# Patient Record
Sex: Female | Born: 1965 | Hispanic: No | Marital: Married | Smoking: Never smoker
Health system: Southern US, Community
[De-identification: ages and names within clinical notes are randomized; demographics above are authoritative.]

## PROBLEM LIST (undated history)

## (undated) DIAGNOSIS — K219 Gastro-esophageal reflux disease without esophagitis: Secondary | ICD-10-CM

## (undated) DIAGNOSIS — E039 Hypothyroidism, unspecified: Secondary | ICD-10-CM

## (undated) DIAGNOSIS — C50919 Malignant neoplasm of unspecified site of unspecified female breast: Secondary | ICD-10-CM

## (undated) DIAGNOSIS — J189 Pneumonia, unspecified organism: Secondary | ICD-10-CM

## (undated) DIAGNOSIS — E079 Disorder of thyroid, unspecified: Secondary | ICD-10-CM

## (undated) DIAGNOSIS — D649 Anemia, unspecified: Secondary | ICD-10-CM

## (undated) HISTORY — DX: Disorder of thyroid, unspecified: E07.9

## (undated) HISTORY — DX: Malignant neoplasm of unspecified site of unspecified female breast: C50.919

## (undated) HISTORY — PX: OTHER SURGICAL HISTORY: SHX169

## (undated) HISTORY — PX: KNEE ARTHROSCOPY: SHX127

## (undated) HISTORY — PX: BREAST RECONSTRUCTION: SHX9

## (undated) HISTORY — PX: COLONOSCOPY: SHX174

## (undated) MED FILL — Fosaprepitant Dimeglumine For IV Infusion 150 MG (Base Eq): INTRAVENOUS | Qty: 5 | Status: AC

## (undated) MED FILL — Dexamethasone Sodium Phosphate Inj 100 MG/10ML: INTRAMUSCULAR | Qty: 1 | Status: AC

---

## 1987-10-26 DIAGNOSIS — C8190 Hodgkin lymphoma, unspecified, unspecified site: Secondary | ICD-10-CM

## 1987-10-26 HISTORY — PX: LYMPH NODE BIOPSY: SHX201

## 1987-10-26 HISTORY — DX: Hodgkin lymphoma, unspecified, unspecified site: C81.90

## 1999-01-15 ENCOUNTER — Other Ambulatory Visit: Admission: RE | Admit: 1999-01-15 | Discharge: 1999-01-15 | Payer: Self-pay | Admitting: Obstetrics and Gynecology

## 1999-06-25 ENCOUNTER — Inpatient Hospital Stay (HOSPITAL_COMMUNITY): Admission: AD | Admit: 1999-06-25 | Discharge: 1999-06-25 | Payer: Self-pay | Admitting: Obstetrics and Gynecology

## 1999-08-18 ENCOUNTER — Inpatient Hospital Stay (HOSPITAL_COMMUNITY): Admission: AD | Admit: 1999-08-18 | Discharge: 1999-08-20 | Payer: Self-pay | Admitting: Obstetrics and Gynecology

## 1999-09-29 ENCOUNTER — Other Ambulatory Visit: Admission: RE | Admit: 1999-09-29 | Discharge: 1999-09-29 | Payer: Self-pay | Admitting: Obstetrics and Gynecology

## 2000-11-21 ENCOUNTER — Other Ambulatory Visit: Admission: RE | Admit: 2000-11-21 | Discharge: 2000-11-21 | Payer: Self-pay | Admitting: Obstetrics and Gynecology

## 2001-12-20 ENCOUNTER — Other Ambulatory Visit: Admission: RE | Admit: 2001-12-20 | Discharge: 2001-12-20 | Payer: Self-pay | Admitting: Obstetrics and Gynecology

## 2002-12-28 ENCOUNTER — Other Ambulatory Visit: Admission: RE | Admit: 2002-12-28 | Discharge: 2002-12-28 | Payer: Self-pay | Admitting: Obstetrics and Gynecology

## 2004-02-12 ENCOUNTER — Other Ambulatory Visit: Admission: RE | Admit: 2004-02-12 | Discharge: 2004-02-12 | Payer: Self-pay | Admitting: Obstetrics and Gynecology

## 2004-12-18 ENCOUNTER — Encounter: Admission: RE | Admit: 2004-12-18 | Discharge: 2004-12-18 | Payer: Self-pay | Admitting: Family Medicine

## 2005-03-04 ENCOUNTER — Other Ambulatory Visit: Admission: RE | Admit: 2005-03-04 | Discharge: 2005-03-04 | Payer: Self-pay | Admitting: Obstetrics and Gynecology

## 2005-10-25 DIAGNOSIS — C50919 Malignant neoplasm of unspecified site of unspecified female breast: Secondary | ICD-10-CM

## 2005-10-25 HISTORY — PX: MASTECTOMY: SHX3

## 2005-10-25 HISTORY — DX: Malignant neoplasm of unspecified site of unspecified female breast: C50.919

## 2006-01-11 ENCOUNTER — Encounter (INDEPENDENT_AMBULATORY_CARE_PROVIDER_SITE_OTHER): Payer: Self-pay | Admitting: Diagnostic Radiology

## 2006-01-11 ENCOUNTER — Encounter (INDEPENDENT_AMBULATORY_CARE_PROVIDER_SITE_OTHER): Payer: Self-pay | Admitting: Specialist

## 2006-01-11 ENCOUNTER — Encounter: Admission: RE | Admit: 2006-01-11 | Discharge: 2006-01-11 | Payer: Self-pay | Admitting: Obstetrics and Gynecology

## 2006-01-19 ENCOUNTER — Ambulatory Visit: Payer: Self-pay | Admitting: Oncology

## 2006-01-19 ENCOUNTER — Encounter: Admission: RE | Admit: 2006-01-19 | Discharge: 2006-01-19 | Payer: Self-pay | Admitting: Obstetrics and Gynecology

## 2006-01-24 ENCOUNTER — Ambulatory Visit: Admission: RE | Admit: 2006-01-24 | Discharge: 2006-03-07 | Payer: Self-pay | Admitting: Radiation Oncology

## 2006-02-22 ENCOUNTER — Inpatient Hospital Stay (HOSPITAL_COMMUNITY): Admission: RE | Admit: 2006-02-22 | Discharge: 2006-02-24 | Payer: Self-pay | Admitting: Surgery

## 2006-02-22 ENCOUNTER — Encounter (INDEPENDENT_AMBULATORY_CARE_PROVIDER_SITE_OTHER): Payer: Self-pay | Admitting: *Deleted

## 2006-03-18 ENCOUNTER — Ambulatory Visit: Payer: Self-pay | Admitting: Oncology

## 2006-03-23 LAB — CBC WITH DIFFERENTIAL/PLATELET
BASO%: 0.9 % (ref 0.0–2.0)
Basophils Absolute: 0.1 10e3/uL (ref 0.0–0.1)
EOS%: 4.5 % (ref 0.0–7.0)
Eosinophils Absolute: 0.4 10e3/uL (ref 0.0–0.5)
HCT: 35.7 % (ref 34.8–46.6)
HGB: 11.8 g/dL (ref 11.6–15.9)
LYMPH%: 35 % (ref 14.0–48.0)
MCH: 31.2 pg (ref 26.0–34.0)
MCHC: 33.2 g/dL (ref 32.0–36.0)
MCV: 93.9 fL (ref 81.0–101.0)
MONO#: 0.6 10e3/uL (ref 0.1–0.9)
MONO%: 7.1 % (ref 0.0–13.0)
NEUT#: 4.6 10e3/uL (ref 1.5–6.5)
NEUT%: 52.5 % (ref 39.6–76.8)
Platelets: 391 10e3/uL (ref 145–400)
RBC: 3.8 10e6/uL (ref 3.70–5.32)
RDW: 14.1 % (ref 11.3–14.5)
WBC: 8.7 10e3/uL (ref 3.9–10.0)
lymph#: 3 10e3/uL (ref 0.9–3.3)

## 2006-03-23 LAB — COMPREHENSIVE METABOLIC PANEL
ALT: 14 U/L (ref 0–40)
AST: 14 U/L (ref 0–37)
Albumin: 3.9 g/dL (ref 3.5–5.2)
Alkaline Phosphatase: 56 U/L (ref 39–117)
BUN: 14 mg/dL (ref 6–23)
Chloride: 105 mEq/L (ref 96–112)
Potassium: 4.5 mEq/L (ref 3.5–5.3)
Sodium: 138 mEq/L (ref 135–145)
Total Protein: 7.6 g/dL (ref 6.0–8.3)

## 2006-03-23 LAB — CANCER ANTIGEN 27.29: CA 27.29: 10 U/mL (ref 0–39)

## 2006-03-28 ENCOUNTER — Ambulatory Visit (HOSPITAL_COMMUNITY): Admission: RE | Admit: 2006-03-28 | Discharge: 2006-03-28 | Payer: Self-pay | Admitting: Oncology

## 2006-03-29 ENCOUNTER — Encounter (INDEPENDENT_AMBULATORY_CARE_PROVIDER_SITE_OTHER): Payer: Self-pay | Admitting: Cardiology

## 2006-03-29 ENCOUNTER — Ambulatory Visit: Admission: RE | Admit: 2006-03-29 | Discharge: 2006-03-29 | Payer: Self-pay | Admitting: Oncology

## 2006-04-18 ENCOUNTER — Ambulatory Visit (HOSPITAL_BASED_OUTPATIENT_CLINIC_OR_DEPARTMENT_OTHER): Admission: RE | Admit: 2006-04-18 | Discharge: 2006-04-18 | Payer: Self-pay | Admitting: Surgery

## 2006-04-20 LAB — CBC WITH DIFFERENTIAL/PLATELET
BASO%: 1 % (ref 0.0–2.0)
Basophils Absolute: 0.2 10*3/uL — ABNORMAL HIGH (ref 0.0–0.1)
EOS%: 2.4 % (ref 0.0–7.0)
Eosinophils Absolute: 0.4 10*3/uL (ref 0.0–0.5)
HCT: 35.1 % (ref 34.8–46.6)
HGB: 11.5 g/dL — ABNORMAL LOW (ref 11.6–15.9)
LYMPH%: 45.1 % (ref 14.0–48.0)
MCH: 31.2 pg (ref 26.0–34.0)
MCHC: 32.8 g/dL (ref 32.0–36.0)
MCV: 94.9 fL (ref 81.0–101.0)
MONO#: 1.4 10*3/uL — ABNORMAL HIGH (ref 0.1–0.9)
MONO%: 8.6 % (ref 0.0–13.0)
NEUT#: 6.8 10*3/uL — ABNORMAL HIGH (ref 1.5–6.5)
NEUT%: 42.9 % (ref 39.6–76.8)
Platelets: 420 10*3/uL — ABNORMAL HIGH (ref 145–400)
RBC: 3.7 10*6/uL (ref 3.70–5.32)
RDW: 12.8 % (ref 11.3–14.5)
WBC: 15.8 10*3/uL — ABNORMAL HIGH (ref 3.9–10.0)
lymph#: 7.1 10*3/uL — ABNORMAL HIGH (ref 0.9–3.3)

## 2006-04-20 LAB — TECHNOLOGIST REVIEW

## 2006-04-26 LAB — CBC WITH DIFFERENTIAL/PLATELET
BASO%: 0.6 % (ref 0.0–2.0)
Eosinophils Absolute: 0.3 10*3/uL (ref 0.0–0.5)
HCT: 35 % (ref 34.8–46.6)
MCHC: 33.7 g/dL (ref 32.0–36.0)
MONO#: 0.1 10*3/uL (ref 0.1–0.9)
NEUT#: 2.4 10*3/uL (ref 1.5–6.5)
NEUT%: 60.4 % (ref 39.6–76.8)
WBC: 3.9 10*3/uL (ref 3.9–10.0)
lymph#: 1.2 10*3/uL (ref 0.9–3.3)

## 2006-05-03 ENCOUNTER — Ambulatory Visit: Payer: Self-pay | Admitting: Oncology

## 2006-05-03 LAB — CBC WITH DIFFERENTIAL/PLATELET
Basophils Absolute: 0.1 10*3/uL (ref 0.0–0.1)
HCT: 35.2 % (ref 34.8–46.6)
HGB: 11.7 g/dL (ref 11.6–15.9)
MCH: 31.2 pg (ref 26.0–34.0)
MONO#: 2 10*3/uL — ABNORMAL HIGH (ref 0.1–0.9)
NEUT%: 70.4 % (ref 39.6–76.8)
Platelets: 288 10*3/uL (ref 145–400)
WBC: 20.3 10*3/uL — ABNORMAL HIGH (ref 3.9–10.0)
lymph#: 3.8 10*3/uL — ABNORMAL HIGH (ref 0.9–3.3)

## 2006-05-10 ENCOUNTER — Ambulatory Visit (HOSPITAL_COMMUNITY): Admission: RE | Admit: 2006-05-10 | Discharge: 2006-05-10 | Payer: Self-pay | Admitting: Oncology

## 2006-05-10 LAB — CBC WITH DIFFERENTIAL/PLATELET
Basophils Absolute: 0 10*3/uL (ref 0.0–0.1)
EOS%: 0.2 % (ref 0.0–7.0)
HCT: 32.6 % — ABNORMAL LOW (ref 34.8–46.6)
HGB: 11.1 g/dL — ABNORMAL LOW (ref 11.6–15.9)
LYMPH%: 31.5 % (ref 14.0–48.0)
MCH: 31.3 pg (ref 26.0–34.0)
MCV: 92.1 fL (ref 81.0–101.0)
MONO%: 2.3 % (ref 0.0–13.0)
NEUT%: 65.5 % (ref 39.6–76.8)

## 2006-05-17 LAB — CBC WITH DIFFERENTIAL/PLATELET
BASO%: 0.3 % (ref 0.0–2.0)
Eosinophils Absolute: 0 10*3/uL (ref 0.0–0.5)
MCHC: 33.5 g/dL (ref 32.0–36.0)
MONO#: 1.8 10*3/uL — ABNORMAL HIGH (ref 0.1–0.9)
NEUT#: 13.2 10*3/uL — ABNORMAL HIGH (ref 1.5–6.5)
RBC: 3.71 10*6/uL (ref 3.70–5.32)
RDW: 14.5 % (ref 11.3–14.5)
WBC: 18 10*3/uL — ABNORMAL HIGH (ref 3.9–10.0)

## 2006-05-24 LAB — CBC WITH DIFFERENTIAL/PLATELET
BASO%: 0.5 % (ref 0.0–2.0)
Eosinophils Absolute: 0 10*3/uL (ref 0.0–0.5)
HCT: 31.6 % — ABNORMAL LOW (ref 34.8–46.6)
LYMPH%: 31 % (ref 14.0–48.0)
MCHC: 34.1 g/dL (ref 32.0–36.0)
MONO#: 0.1 10*3/uL (ref 0.1–0.9)
NEUT#: 1.8 10*3/uL (ref 1.5–6.5)
NEUT%: 65.9 % (ref 39.6–76.8)
Platelets: 296 10*3/uL (ref 145–400)
RBC: 3.42 10*6/uL — ABNORMAL LOW (ref 3.70–5.32)
WBC: 2.8 10*3/uL — ABNORMAL LOW (ref 3.9–10.0)
lymph#: 0.9 10*3/uL (ref 0.9–3.3)

## 2006-05-31 LAB — CBC WITH DIFFERENTIAL/PLATELET
BASO%: 0.4 % (ref 0.0–2.0)
EOS%: 0.1 % (ref 0.0–7.0)
HCT: 34.1 % — ABNORMAL LOW (ref 34.8–46.6)
LYMPH%: 13.3 % — ABNORMAL LOW (ref 14.0–48.0)
MCH: 31.5 pg (ref 26.0–34.0)
MCHC: 33.7 g/dL (ref 32.0–36.0)
NEUT%: 70.5 % (ref 39.6–76.8)
RBC: 3.64 10*6/uL — ABNORMAL LOW (ref 3.70–5.32)
lymph#: 1.9 10*3/uL (ref 0.9–3.3)

## 2006-06-10 LAB — CBC WITH DIFFERENTIAL/PLATELET
BASO%: 3.9 % — ABNORMAL HIGH (ref 0.0–2.0)
EOS%: 0.3 % (ref 0.0–7.0)
HGB: 10.1 g/dL — ABNORMAL LOW (ref 11.6–15.9)
MCH: 30.9 pg (ref 26.0–34.0)
MCHC: 33.5 g/dL (ref 32.0–36.0)
RDW: 14.3 % (ref 11.3–14.5)
lymph#: 1.7 10*3/uL (ref 0.9–3.3)

## 2006-06-12 ENCOUNTER — Ambulatory Visit: Payer: Self-pay | Admitting: Oncology

## 2006-06-21 LAB — CBC WITH DIFFERENTIAL/PLATELET
Basophils Absolute: 0 10*3/uL (ref 0.0–0.1)
Eosinophils Absolute: 0 10*3/uL (ref 0.0–0.5)
HGB: 10.8 g/dL — ABNORMAL LOW (ref 11.6–15.9)
MONO#: 1.5 10*3/uL — ABNORMAL HIGH (ref 0.1–0.9)
NEUT#: 7 10*3/uL — ABNORMAL HIGH (ref 1.5–6.5)
RDW: 17.1 % — ABNORMAL HIGH (ref 11.3–14.5)
lymph#: 1.5 10*3/uL (ref 0.9–3.3)

## 2006-06-28 LAB — CBC WITH DIFFERENTIAL/PLATELET
Basophils Absolute: 0 10*3/uL (ref 0.0–0.1)
Eosinophils Absolute: 0.1 10*3/uL (ref 0.0–0.5)
HGB: 11.1 g/dL — ABNORMAL LOW (ref 11.6–15.9)
LYMPH%: 25.3 % (ref 14.0–48.0)
MCV: 93.4 fL (ref 81.0–101.0)
MONO%: 11.5 % (ref 0.0–13.0)
NEUT#: 4.1 10*3/uL (ref 1.5–6.5)
Platelets: 470 10*3/uL — ABNORMAL HIGH (ref 145–400)
RDW: 17 % — ABNORMAL HIGH (ref 11.3–14.5)

## 2006-06-28 LAB — URINALYSIS, MICROSCOPIC - CHCC
Glucose: NEGATIVE g/dL
Nitrite: POSITIVE
Protein: <30 mg/dL

## 2006-07-05 LAB — CBC WITH DIFFERENTIAL/PLATELET
BASO%: 0.1 % (ref 0.0–2.0)
EOS%: 4.6 % (ref 0.0–7.0)
HCT: 32.8 % — ABNORMAL LOW (ref 34.8–46.6)
LYMPH%: 30.6 % (ref 14.0–48.0)
MCH: 31.3 pg (ref 26.0–34.0)
MCHC: 33.3 g/dL (ref 32.0–36.0)
MCV: 93.9 fL (ref 81.0–101.0)
MONO%: 9.2 % (ref 0.0–13.0)
NEUT%: 55.5 % (ref 39.6–76.8)
Platelets: 357 10*3/uL (ref 145–400)

## 2006-07-06 LAB — URINALYSIS, MICROSCOPIC - CHCC
Leukocyte Esterase: NEGATIVE
Nitrite: NEGATIVE
Protein: NEGATIVE mg/dL
Specific Gravity, Urine: 1.03 (ref 1.003–1.035)

## 2006-07-12 LAB — CBC WITH DIFFERENTIAL/PLATELET
BASO%: 1.3 % (ref 0.0–2.0)
EOS%: 4.6 % (ref 0.0–7.0)
Eosinophils Absolute: 0.2 10*3/uL (ref 0.0–0.5)
HCT: 31.5 % — ABNORMAL LOW (ref 34.8–46.6)
LYMPH%: 24.8 % (ref 14.0–48.0)
MCH: 31.9 pg (ref 26.0–34.0)
MCHC: 33.8 g/dL (ref 32.0–36.0)
MCV: 94.1 fL (ref 81.0–101.0)
MONO#: 0.5 10*3/uL (ref 0.1–0.9)
MONO%: 9.8 % (ref 0.0–13.0)
NEUT%: 59.5 % (ref 39.6–76.8)
Platelets: 413 10*3/uL — ABNORMAL HIGH (ref 145–400)
RDW: 17.4 % — ABNORMAL HIGH (ref 11.3–14.5)
WBC: 5.2 10*3/uL (ref 3.9–10.0)

## 2006-07-15 ENCOUNTER — Ambulatory Visit: Payer: Self-pay | Admitting: Oncology

## 2006-07-19 LAB — CBC WITH DIFFERENTIAL/PLATELET
Basophils Absolute: 0 10*3/uL (ref 0.0–0.1)
EOS%: 4 % (ref 0.0–7.0)
Eosinophils Absolute: 0.2 10*3/uL (ref 0.0–0.5)
HCT: 32.8 % — ABNORMAL LOW (ref 34.8–46.6)
HGB: 10.9 g/dL — ABNORMAL LOW (ref 11.6–15.9)
LYMPH%: 29.6 % (ref 14.0–48.0)
MCH: 31.5 pg (ref 26.0–34.0)
MCV: 94.9 fL (ref 81.0–101.0)
MONO%: 11 % (ref 0.0–13.0)
NEUT#: 3.4 10*3/uL (ref 1.5–6.5)
NEUT%: 55.4 % (ref 39.6–76.8)
Platelets: 438 10*3/uL — ABNORMAL HIGH (ref 145–400)

## 2006-07-19 LAB — URINALYSIS, MICROSCOPIC - CHCC
Bilirubin (Urine): NEGATIVE
Blood: NEGATIVE
Nitrite: NEGATIVE
Protein: NEGATIVE mg/dL
pH: 6 (ref 4.6–8.0)

## 2006-07-21 LAB — URINE CULTURE

## 2006-07-26 LAB — CBC WITH DIFFERENTIAL/PLATELET
Basophils Absolute: 0 10*3/uL (ref 0.0–0.1)
EOS%: 2.1 % (ref 0.0–7.0)
Eosinophils Absolute: 0.1 10*3/uL (ref 0.0–0.5)
HGB: 11.5 g/dL — ABNORMAL LOW (ref 11.6–15.9)
LYMPH%: 25.8 % (ref 14.0–48.0)
MCH: 31.9 pg (ref 26.0–34.0)
MCV: 95.3 fL (ref 81.0–101.0)
MONO%: 8.9 % (ref 0.0–13.0)
Platelets: 466 10*3/uL — ABNORMAL HIGH (ref 145–400)
RBC: 3.6 10*6/uL — ABNORMAL LOW (ref 3.70–5.32)
RDW: 17.6 % — ABNORMAL HIGH (ref 11.3–14.5)

## 2006-08-09 LAB — CBC WITH DIFFERENTIAL/PLATELET
BASO%: 1.7 % (ref 0.0–2.0)
EOS%: 1.2 % (ref 0.0–7.0)
Eosinophils Absolute: 0.1 10*3/uL (ref 0.0–0.5)
LYMPH%: 34.2 % (ref 14.0–48.0)
MCHC: 33.7 g/dL (ref 32.0–36.0)
MCV: 92.8 fL (ref 81.0–101.0)
MONO%: 8.1 % (ref 0.0–13.0)
NEUT#: 2.6 10*3/uL (ref 1.5–6.5)
Platelets: 482 10*3/uL — ABNORMAL HIGH (ref 145–400)
RBC: 3.7 10*6/uL (ref 3.70–5.32)
RDW: 16.4 % — ABNORMAL HIGH (ref 11.3–14.5)

## 2006-08-16 LAB — CBC WITH DIFFERENTIAL/PLATELET
BASO%: 0 % (ref 0.0–2.0)
Basophils Absolute: 0 10*3/uL (ref 0.0–0.1)
Eosinophils Absolute: 0.1 10*3/uL (ref 0.0–0.5)
HCT: 35.9 % (ref 34.8–46.6)
LYMPH%: 31.7 % (ref 14.0–48.0)
MCHC: 32.8 g/dL (ref 32.0–36.0)
MCV: 95 fL (ref 81.0–101.0)
MONO#: 0.5 10*3/uL (ref 0.1–0.9)
MONO%: 10.1 % (ref 0.0–13.0)
NEUT#: 2.7 10*3/uL (ref 1.5–6.5)
RBC: 3.78 10*6/uL (ref 3.70–5.32)
RDW: 17.8 % — ABNORMAL HIGH (ref 11.3–14.5)
WBC: 4.8 10*3/uL (ref 3.9–10.0)
lymph#: 1.5 10*3/uL (ref 0.9–3.3)

## 2006-08-16 LAB — TECHNOLOGIST REVIEW

## 2006-08-19 ENCOUNTER — Ambulatory Visit: Payer: Self-pay | Admitting: Oncology

## 2006-08-23 LAB — CBC WITH DIFFERENTIAL/PLATELET
BASO%: 0 % (ref 0.0–2.0)
EOS%: 1.5 % (ref 0.0–7.0)
Eosinophils Absolute: 0.1 10*3/uL (ref 0.0–0.5)
LYMPH%: 38.2 % (ref 14.0–48.0)
MCH: 31.4 pg (ref 26.0–34.0)
MCHC: 32.8 g/dL (ref 32.0–36.0)
MCV: 95.7 fL (ref 81.0–101.0)
MONO%: 8.7 % (ref 0.0–13.0)
Platelets: 417 10*3/uL — ABNORMAL HIGH (ref 145–400)
RBC: 3.77 10*6/uL (ref 3.70–5.32)

## 2006-08-23 LAB — TECHNOLOGIST REVIEW

## 2006-10-03 ENCOUNTER — Ambulatory Visit (HOSPITAL_COMMUNITY): Admission: RE | Admit: 2006-10-03 | Discharge: 2006-10-03 | Payer: Self-pay | Admitting: Oncology

## 2006-10-06 ENCOUNTER — Ambulatory Visit: Payer: Self-pay | Admitting: Oncology

## 2006-10-10 LAB — COMPREHENSIVE METABOLIC PANEL
AST: 19 U/L (ref 0–37)
Albumin: 4.2 g/dL (ref 3.5–5.2)
Alkaline Phosphatase: 72 U/L (ref 39–117)
Potassium: 4 mEq/L (ref 3.5–5.3)
Sodium: 138 mEq/L (ref 135–145)
Total Bilirubin: 0.3 mg/dL (ref 0.3–1.2)
Total Protein: 7.7 g/dL (ref 6.0–8.3)

## 2006-10-10 LAB — CBC WITH DIFFERENTIAL/PLATELET
Basophils Absolute: 0.1 10*3/uL (ref 0.0–0.1)
Eosinophils Absolute: 0.2 10*3/uL (ref 0.0–0.5)
HCT: 36.7 % (ref 34.8–46.6)
HGB: 12.4 g/dL (ref 11.6–15.9)
LYMPH%: 40.5 % (ref 14.0–48.0)
MONO#: 0.7 10*3/uL (ref 0.1–0.9)
NEUT#: 3.4 10*3/uL (ref 1.5–6.5)
NEUT%: 45.3 % (ref 39.6–76.8)
Platelets: 368 10*3/uL (ref 145–400)
RBC: 4.13 10*6/uL (ref 3.70–5.32)
WBC: 7.4 10*3/uL (ref 3.9–10.0)

## 2006-10-10 LAB — CANCER ANTIGEN 27.29: CA 27.29: 11 U/mL (ref 0–39)

## 2006-12-21 ENCOUNTER — Ambulatory Visit: Payer: Self-pay | Admitting: Oncology

## 2006-12-26 LAB — COMPREHENSIVE METABOLIC PANEL
Alkaline Phosphatase: 61 U/L (ref 39–117)
BUN: 16 mg/dL (ref 6–23)
Glucose, Bld: 83 mg/dL (ref 70–99)
Sodium: 139 mEq/L (ref 135–145)
Total Bilirubin: 0.2 mg/dL — ABNORMAL LOW (ref 0.3–1.2)
Total Protein: 7.5 g/dL (ref 6.0–8.3)

## 2006-12-26 LAB — CBC WITH DIFFERENTIAL/PLATELET
EOS%: 1.3 % (ref 0.0–7.0)
Eosinophils Absolute: 0.1 10*3/uL (ref 0.0–0.5)
LYMPH%: 40.2 % (ref 14.0–48.0)
MCH: 30.4 pg (ref 26.0–34.0)
MCV: 89.8 fL (ref 81.0–101.0)
MONO%: 8.1 % (ref 0.0–13.0)
NEUT#: 4 10*3/uL (ref 1.5–6.5)
Platelets: 338 10*3/uL (ref 145–400)
RBC: 3.84 10*6/uL (ref 3.70–5.32)

## 2007-01-11 ENCOUNTER — Ambulatory Visit (HOSPITAL_COMMUNITY): Admission: RE | Admit: 2007-01-11 | Discharge: 2007-01-11 | Payer: Self-pay | Admitting: Oncology

## 2007-04-11 ENCOUNTER — Ambulatory Visit: Payer: Self-pay | Admitting: Oncology

## 2007-04-17 LAB — COMPREHENSIVE METABOLIC PANEL
ALT: 20 U/L (ref 0–35)
AST: 21 U/L (ref 0–37)
Albumin: 3.9 g/dL (ref 3.5–5.2)
Alkaline Phosphatase: 68 U/L (ref 39–117)
Glucose, Bld: 99 mg/dL (ref 70–99)
Potassium: 4.1 mEq/L (ref 3.5–5.3)
Sodium: 139 mEq/L (ref 135–145)
Total Protein: 7.5 g/dL (ref 6.0–8.3)

## 2007-04-17 LAB — CBC WITH DIFFERENTIAL/PLATELET
BASO%: 0.7 % (ref 0.0–2.0)
Basophils Absolute: 0 10*3/uL (ref 0.0–0.1)
EOS%: 1.9 % (ref 0.0–7.0)
Eosinophils Absolute: 0.1 10*3/uL (ref 0.0–0.5)
MCH: 31.7 pg (ref 26.0–34.0)
MCV: 93.4 fL (ref 81.0–101.0)
MONO#: 0.6 10*3/uL (ref 0.1–0.9)
MONO%: 7.5 % (ref 0.0–13.0)
NEUT#: 3.9 10*3/uL (ref 1.5–6.5)
RBC: 3.8 10*6/uL (ref 3.70–5.32)
RDW: 14.8 % — ABNORMAL HIGH (ref 11.3–14.5)
lymph#: 2.8 10*3/uL (ref 0.9–3.3)

## 2007-07-18 ENCOUNTER — Ambulatory Visit: Payer: Self-pay | Admitting: Oncology

## 2007-10-30 ENCOUNTER — Ambulatory Visit: Payer: Self-pay | Admitting: Oncology

## 2007-11-01 LAB — CBC WITH DIFFERENTIAL/PLATELET
BASO%: 0.8 % (ref 0.0–2.0)
Basophils Absolute: 0.1 10*3/uL (ref 0.0–0.1)
EOS%: 1.3 % (ref 0.0–7.0)
HCT: 35.9 % (ref 34.8–46.6)
LYMPH%: 44.5 % (ref 14.0–48.0)
MCH: 31 pg (ref 26.0–34.0)
MCHC: 33.7 g/dL (ref 32.0–36.0)
NEUT%: 46 % (ref 39.6–76.8)
Platelets: 332 10*3/uL (ref 145–400)

## 2007-11-02 LAB — COMPREHENSIVE METABOLIC PANEL
BUN: 13 mg/dL (ref 6–23)
CO2: 24 mEq/L (ref 19–32)
Calcium: 9.1 mg/dL (ref 8.4–10.5)
Chloride: 101 mEq/L (ref 96–112)
Creatinine, Ser: 0.85 mg/dL (ref 0.40–1.20)
Glucose, Bld: 116 mg/dL — ABNORMAL HIGH (ref 70–99)
Total Bilirubin: 0.3 mg/dL (ref 0.3–1.2)

## 2007-11-02 LAB — CANCER ANTIGEN 27.29: CA 27.29: 9 U/mL (ref 0–39)

## 2007-11-02 LAB — TSH: TSH: 0.511 u[IU]/mL (ref 0.350–5.500)

## 2007-11-14 IMAGING — PT NM PET TUM IMG SKULL BASE T - THIGH
6 series · 25 of 25 positions shown · non-contrast
Comparison: CT chest dated 10/03/06 and bone scan dated 03/28/06.

CLINICAL DATA: Breast Cancer
FDG PET-CT TUMOR IMAGING (SKULL BASE TO THIGHS):
Fasting Blood Glucose:  148.
TECHNIQUE: 18.6 mCi F-18 FDG was injected intravenously via the right antecubital fossa.  Full-ring PET imaging was performed from the skull base through the mid-thighs 70 minutes after injection.  CT data was obtained and used for attenuation correction and anatomic localization only.  (This was not acquired as a diagnostic CT examination).

[Series 1: pet ac · axial · 3.3mm · 4.69mm/px · z∈[-879,-9]mm · 6 of 267 slices shown]
[im 1/267]
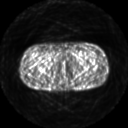
[im 54/267]
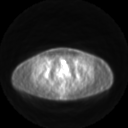
[im 107/267]
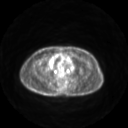
[im 160/267]
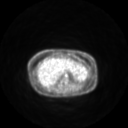
[im 213/267]
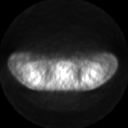
[im 267/267]
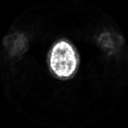

[Series 2: ct images · axial · 3.8mm · 0.98mm/px · z∈[-879,-10]mm · 6 of 267 slices shown]
[im 1/267]
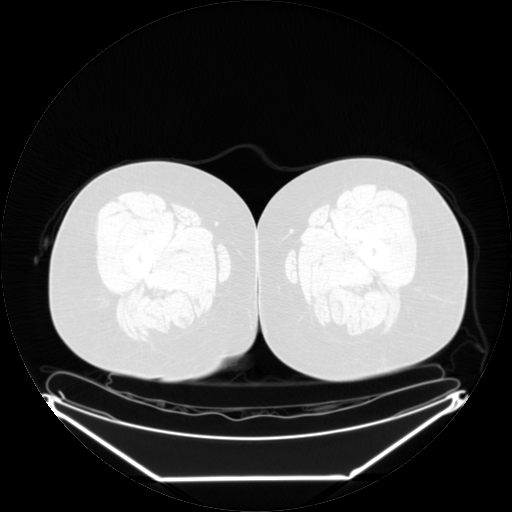
[im 54/267]
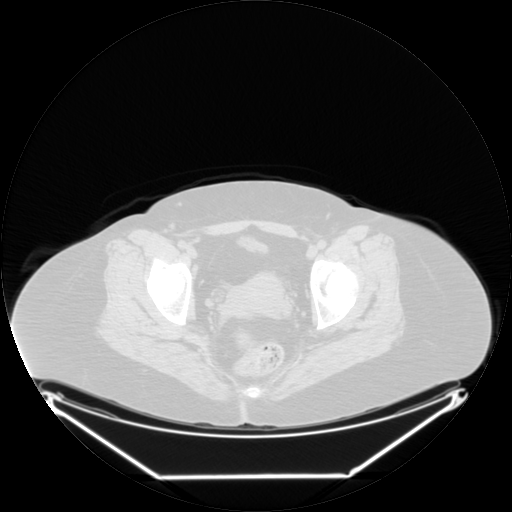
[im 107/267]
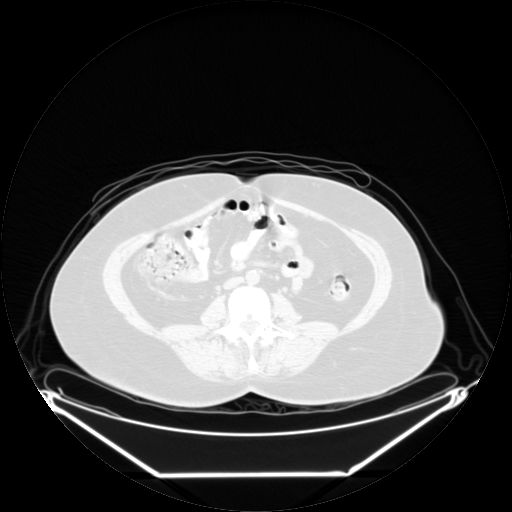
[im 160/267]
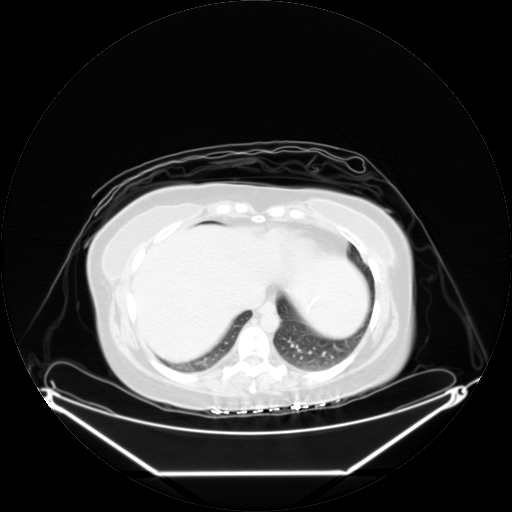
[im 213/267]
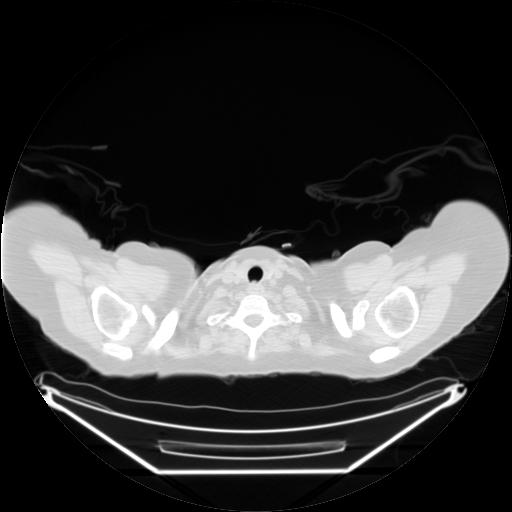
[im 267/267  brain]
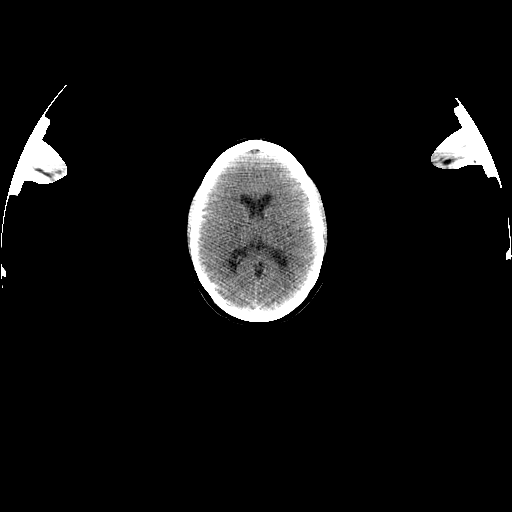

[Series 2: pet nac · axial · 3.3mm · 4.69mm/px · z∈[-879,-9]mm · 6 of 267 slices shown]
[im 1/267]
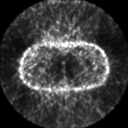
[im 54/267]
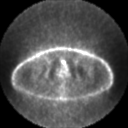
[im 107/267]
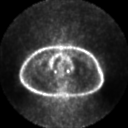
[im 160/267]
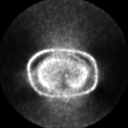
[im 213/267]
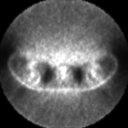
[im 267/267]
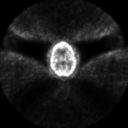

[Series 123: mip · coronal · 3.3mm · 4.69mm/px · 1 of 30 slices shown]
[im 1/30]
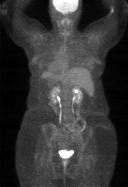

[Series 152: reformatted · axial · 3.3mm · 3.91mm/px · z∈[-794,-81]mm · 5 of 217 slices shown (1 of 2)]
[im 1/217]
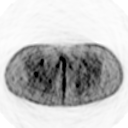
[im 55/217]
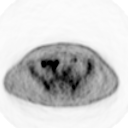
[im 109/217]
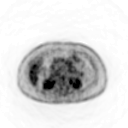
[im 163/217]
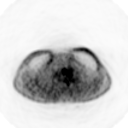
[im 217/217]
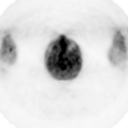

[Series 154: reformatted · coronal · 4.7mm · 6.98mm/px · 1 of 57 slices shown (2 of 2)]
[im 1/57]
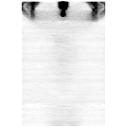

[25 of 25 positions shown; findings below may reference images not displayed]

FINDINGS: Postoperative changes from bilateral breast implants placement are noted.  There is no evidence for residual or recurrent local tumor.  Increased uptake anterior to the breast implants is noted which is somewhat nonspecific and may reflect post therapy or post-surgical change.  There is no hypermetabolic axillary, supraclavicular, or retropectoral lymphadenopathy.
Anterior mediastinal lymph node measures 15.7 x 9.0 mm (image 76).  There is no abnormal uptake within this lymph node.  No hypermetabolic mediastinal or hilar nodes are noted. There is no pericardial or pleural effusion.  
Previously described pulmonary nodule within the left upper lobe is not seen on the current exam. However, this may in fact, represent the nondiagnostic CT technique.  There are no hypermetabolic pulmonary nodules identified.  No abnormal foci of increased uptake are seen within the liver parenchyma.
The adrenal glands are negative. 
The patient is status post splenectomy.
Both kidneys are negative.
No hypermetabolic retroperitoneal or small bowel mesenteric lymph nodes are identified.
There is no hypermetabolic pelvic, or inguinal lymph nodes.
IMPRESSION: 1.  There is no definitive evidence for residual or recurrent hypermetabolic tumor.
2.  The previously described anterior mediastinal node does not exhibit abnormal increased FDG uptake on the current exam.
3.  Nonspecific left upper lobe subpleural nodule is not seen on the nondiagnostic CT portion of this exam.

## 2008-04-01 ENCOUNTER — Ambulatory Visit: Payer: Self-pay | Admitting: Oncology

## 2008-04-01 ENCOUNTER — Ambulatory Visit (HOSPITAL_COMMUNITY): Admission: RE | Admit: 2008-04-01 | Discharge: 2008-04-01 | Payer: Self-pay | Admitting: Oncology

## 2008-04-01 LAB — CBC WITH DIFFERENTIAL/PLATELET
Basophils Absolute: 0 10*3/uL (ref 0.0–0.1)
EOS%: 2.2 % (ref 0.0–7.0)
HCT: 35.8 % (ref 34.8–46.6)
HGB: 12.1 g/dL (ref 11.6–15.9)
MCH: 31.2 pg (ref 26.0–34.0)
MCHC: 33.7 g/dL (ref 32.0–36.0)
MCV: 92.8 fL (ref 81.0–101.0)
MONO%: 9.2 % (ref 0.0–13.0)
NEUT%: 57.4 % (ref 39.6–76.8)

## 2008-04-02 LAB — COMPREHENSIVE METABOLIC PANEL
AST: 18 U/L (ref 0–37)
Alkaline Phosphatase: 71 U/L (ref 39–117)
BUN: 19 mg/dL (ref 6–23)
Creatinine, Ser: 0.96 mg/dL (ref 0.40–1.20)
Total Bilirubin: 0.3 mg/dL (ref 0.3–1.2)

## 2009-02-25 ENCOUNTER — Ambulatory Visit: Payer: Self-pay | Admitting: Oncology

## 2009-02-27 LAB — COMPREHENSIVE METABOLIC PANEL
ALT: 22 U/L (ref 0–35)
AST: 23 U/L (ref 0–37)
Alkaline Phosphatase: 61 U/L (ref 39–117)
CO2: 28 mEq/L (ref 19–32)
Creatinine, Ser: 0.85 mg/dL (ref 0.40–1.20)
Sodium: 136 mEq/L (ref 135–145)
Total Bilirubin: 0.6 mg/dL (ref 0.3–1.2)

## 2009-02-27 LAB — CBC WITH DIFFERENTIAL/PLATELET
BASO%: 0.6 % (ref 0.0–2.0)
EOS%: 0.9 % (ref 0.0–7.0)
HCT: 35.4 % (ref 34.8–46.6)
LYMPH%: 36.5 % (ref 14.0–49.7)
MCH: 31.3 pg (ref 25.1–34.0)
MCHC: 33.9 g/dL (ref 31.5–36.0)
MONO%: 6.5 % (ref 0.0–14.0)
NEUT%: 55.5 % (ref 38.4–76.8)
Platelets: 330 10*3/uL (ref 145–400)
RBC: 3.84 10*6/uL (ref 3.70–5.45)

## 2009-02-27 LAB — CANCER ANTIGEN 27.29: CA 27.29: 9 U/mL (ref 0–39)

## 2009-03-08 LAB — ESTRADIOL, ULTRA SENS

## 2009-03-10 ENCOUNTER — Ambulatory Visit (HOSPITAL_COMMUNITY): Admission: RE | Admit: 2009-03-10 | Discharge: 2009-03-10 | Payer: Self-pay | Admitting: Oncology

## 2009-05-30 ENCOUNTER — Ambulatory Visit: Payer: Self-pay | Admitting: Oncology

## 2009-06-03 LAB — CBC WITH DIFFERENTIAL/PLATELET
Basophils Absolute: 0.1 10*3/uL (ref 0.0–0.1)
HCT: 35.2 % (ref 34.8–46.6)
HGB: 11.8 g/dL (ref 11.6–15.9)
MCH: 31.1 pg (ref 25.1–34.0)
MONO#: 1 10*3/uL — ABNORMAL HIGH (ref 0.1–0.9)
NEUT%: 55.1 % (ref 38.4–76.8)
lymph#: 3.6 10*3/uL — ABNORMAL HIGH (ref 0.9–3.3)

## 2009-06-03 LAB — COMPREHENSIVE METABOLIC PANEL
BUN: 8 mg/dL (ref 6–23)
CO2: 28 mEq/L (ref 19–32)
Calcium: 9.2 mg/dL (ref 8.4–10.5)
Chloride: 106 mEq/L (ref 96–112)
Creatinine, Ser: 0.78 mg/dL (ref 0.40–1.20)

## 2009-06-03 LAB — FOLLICLE STIMULATING HORMONE: FSH: 36.8 m[IU]/mL

## 2009-06-03 LAB — CANCER ANTIGEN 27.29: CA 27.29: 15 U/mL (ref 0–39)

## 2009-06-03 LAB — LUTEINIZING HORMONE: LH: 30.3 m[IU]/mL

## 2009-06-11 LAB — ESTRADIOL, ULTRA SENS

## 2009-12-01 ENCOUNTER — Ambulatory Visit: Payer: Self-pay | Admitting: Oncology

## 2010-01-12 ENCOUNTER — Ambulatory Visit: Payer: Self-pay | Admitting: Oncology

## 2010-01-14 LAB — CANCER ANTIGEN 27.29: CA 27.29: 14 U/mL (ref 0–39)

## 2010-01-14 LAB — COMPREHENSIVE METABOLIC PANEL
CO2: 23 mEq/L (ref 19–32)
Calcium: 9.4 mg/dL (ref 8.4–10.5)
Creatinine, Ser: 0.96 mg/dL (ref 0.40–1.20)
Glucose, Bld: 106 mg/dL — ABNORMAL HIGH (ref 70–99)
Sodium: 139 mEq/L (ref 135–145)
Total Bilirubin: 0.3 mg/dL (ref 0.3–1.2)
Total Protein: 8.2 g/dL (ref 6.0–8.3)

## 2010-01-14 LAB — CBC WITH DIFFERENTIAL/PLATELET
Basophils Absolute: 0.1 10*3/uL (ref 0.0–0.1)
Eosinophils Absolute: 0.3 10*3/uL (ref 0.0–0.5)
HGB: 12.8 g/dL (ref 11.6–15.9)
LYMPH%: 44.8 % (ref 14.0–49.7)
MCV: 94.1 fL (ref 79.5–101.0)
MONO%: 6.4 % (ref 0.0–14.0)
NEUT#: 5 10*3/uL (ref 1.5–6.5)
Platelets: 371 10*3/uL (ref 145–400)

## 2011-01-14 ENCOUNTER — Other Ambulatory Visit: Payer: Self-pay | Admitting: Physician Assistant

## 2011-01-14 ENCOUNTER — Encounter (HOSPITAL_BASED_OUTPATIENT_CLINIC_OR_DEPARTMENT_OTHER): Payer: Self-pay | Admitting: Oncology

## 2011-01-14 DIAGNOSIS — Z8571 Personal history of Hodgkin lymphoma: Secondary | ICD-10-CM

## 2011-01-14 DIAGNOSIS — C50419 Malignant neoplasm of upper-outer quadrant of unspecified female breast: Secondary | ICD-10-CM

## 2011-01-14 DIAGNOSIS — Z170 Estrogen receptor positive status [ER+]: Secondary | ICD-10-CM

## 2011-01-14 LAB — COMPREHENSIVE METABOLIC PANEL
ALT: 19 U/L (ref 0–35)
AST: 21 U/L (ref 0–37)
Albumin: 3.6 g/dL (ref 3.5–5.2)
CO2: 25 mEq/L (ref 19–32)
Calcium: 8.8 mg/dL (ref 8.4–10.5)
Chloride: 105 mEq/L (ref 96–112)
Creatinine, Ser: 0.75 mg/dL (ref 0.40–1.20)
Potassium: 3.9 mEq/L (ref 3.5–5.3)
Sodium: 137 mEq/L (ref 135–145)
Total Protein: 8 g/dL (ref 6.0–8.3)

## 2011-01-14 LAB — CBC WITH DIFFERENTIAL/PLATELET
BASO%: 0.7 % (ref 0.0–2.0)
HCT: 35.2 % (ref 34.8–46.6)
MCHC: 33.8 g/dL (ref 31.5–36.0)
MONO#: 0.9 10*3/uL (ref 0.1–0.9)
NEUT%: 47.9 % (ref 38.4–76.8)
RDW: 15.1 % — ABNORMAL HIGH (ref 11.2–14.5)
WBC: 10.6 10*3/uL — ABNORMAL HIGH (ref 3.9–10.3)
lymph#: 4.4 10*3/uL — ABNORMAL HIGH (ref 0.9–3.3)

## 2011-01-15 LAB — CANCER ANTIGEN 27.29: CA 27.29: 10 U/mL (ref 0–39)

## 2011-01-27 ENCOUNTER — Encounter (HOSPITAL_BASED_OUTPATIENT_CLINIC_OR_DEPARTMENT_OTHER): Payer: 59 | Admitting: Oncology

## 2011-01-27 DIAGNOSIS — C50419 Malignant neoplasm of upper-outer quadrant of unspecified female breast: Secondary | ICD-10-CM

## 2011-01-27 DIAGNOSIS — Z170 Estrogen receptor positive status [ER+]: Secondary | ICD-10-CM

## 2011-01-27 DIAGNOSIS — Z8571 Personal history of Hodgkin lymphoma: Secondary | ICD-10-CM

## 2011-03-12 NOTE — Op Note (Signed)
NAME:  Lauren Chandler, Lauren Chandler               ACCOUNT NO.:  [id number]   MEDICAL RECORD NO.:  [id number]          PATIENT TYPE:  AMB   LOCATION:  DSC                          FACILITY:  [facility]   PHYSICIAN:  Currie Paris, M.D.DATE OF BIRTH:  Oct 23, 1966   DATE OF PROCEDURE:  04/18/2006  DATE OF DISCHARGE:                                 OPERATIVE REPORT   OFFICE MEDICAL RECORD NUMBER:  [id number].   PREOPERATIVE DIAGNOSIS:  Breast cancer, inadequate venous access for  chemotherapy.   POSTOPERATIVE DIAGNOSIS:  Breast cancer, inadequate venous access for  chemotherapy.   OPERATION:  Placement of Port-A-Cath.   SURGEON:  Dr. Jamey Ripa.   ANESTHESIA:  General.   CLINICAL HISTORY:  This is a 45 year old lady who is status post left  mastectomy for breast cancer with prophylactic right mastectomy.  She has  implants in, which are submuscular.  She needs Port-A-Cath for IV access.   DESCRIPTION OF PROCEDURE:  The patient was seen in the holding area with her  husband and they had no further questions.  We confirmed Port-A-Cath  placement as the planned procedure.   She was taken to have room and, after satisfactory general anesthesia had  been obtained, was placed in Trendelenburg position, and the upper chest and  lower neck were prepped and draped as a single sterile field.  The time-out  occurred.   Using the Port-A-Cath kit, I was able to enter the right subclavian vein in  the initial attempt, and thread the guidewire in easily.  Using fluoro it  appeared the guidewire had threaded across the midline into the left  subclavian.  With fluoro guidance, it was backed out until it threaded into  the superior vena cava and right atrial area.   I then made a transverse incision over the anterior chest wall where I  thought we had adequate muscle coverage over the implant.  I raised a  subcutaneously flap.  This was a very thin flap as most of her subcu here  had been taken off with her  mastectomy, but I thought this was going to be  the best location for the port, given the implant, etc.   Once this was made, I used the tunneling device to make a tunnel up to the  subclavian site, and pulled the catheter tubing through.   The guidewire tract was dilated once, with the dilator peel-away sheath, and  this went easily.  The dilator and guidewire were removed and the catheter  threaded to approximately 20 centimeters.  It aspirated easily.  The peel-  away sheath was removed.   Using fluoro, I saw that we were in the right atrium, and this was backed up  to where I thought it was at the junction of the right atrium and superior  vena cava.  It aspirated and irrigated easily.  It was attached to the  reservoir which aspirated and irrigated easily.  I sutured this to the  fascia and began to close.  At this point we did a final fluoro, and at this  point it appeared that the tip  of the catheter actually had migrated down  into the right atrium again.  At this point, I therefore disconnected the  reservoir from the tubing, backed the tubing up under fluoro guidance again  until we thought we were above the right atrium, cut it off, reattached it,  and re-engaged the locking mechanism.  Again, it irrigated and aspirated  easily.   It was then sutured to the fascia with 3-0 Vicryl.  I then closed the skin  with some 4-0 Prolene.  Because she had very limited subcuticular tissue, I  thought that a permanent suture would be better than my usual absorbable.  The subclavian site was closed with 4-0 Monocryl and Steri-Strips.   The catheter was accessed, aspirated, flushed with dilute heparin and  concentrated heparin, and the needle left in for postop chemo on Wednesday.  The patient tolerated the procedure well.  There were no operative  complications and all counts were correct.      Currie Paris, M.D.  Electronically Signed     CJS/MEDQ  D:  04/18/2006  T:   04/18/2006  Job:  [id number]

## 2011-03-12 NOTE — Discharge Summary (Signed)
NAME:  Lauren Chandler, GENERAL               ACCOUNT NO.:  [id number]   MEDICAL RECORD NO.:  [id number]          PATIENT TYPE:  INP   LOCATION:  5711                         FACILITY:  [facility]   PHYSICIAN:  Lyman Speller, MD       DATE OF BIRTH:  October 04, 1966   DATE OF ADMISSION:  02/22/2006  DATE OF DISCHARGE:  02/24/2006                                 DISCHARGE SUMMARY   ADMITTING DIAGNOSIS:  Left breast carcinoma.   DISCHARGE DIAGNOSIS:  Left breast carcinoma.   PROCEDURES PERFORMED:  1.  Bilateral total mastectomies with left sentinel node biopsy performed by      Dr. Cyndia Bent.  2.  Placement of bilateral subpectoral tissue expanders for breast      reconstruction performed by Dr. Jillyn Hidden.   HOSPITAL COURSE:  The patient was taken to the operating room on Feb 22, 2006, at which time she underwent procedures as noted above.  The procedure  was uncomplicated.  Postoperatively, the patient was returned to the  recovery room and did well.  She was then admitted to fifth surgical for  postoperative management.  On the first postoperative day, the patient was  noted to be doing reasonably well.  Her pain was well controlled and she was  tolerating diet.  She was still somewhat unsteady on her feet and had just  discontinued use of the patient controlled analgesia machine.  The decision  was made to maintain her for one additional day in the hospital and  transition her to oral pain medications.  The first postoperative day, the  flaps were noted to be pink and viable, and her JPs were functioning well.  The patient is presently postoperative day #2.  She has done well with oral  pain medications and continues to tolerate her diet.  She feels much better  today and appears ready for discharge.  The patient will discharged home  today.   DISCHARGE MEDICATIONS:  1.  Percocet tablets 1 or 2 p.o. q.4h. as needed for pain, dispense #40.  2.  Keflex 500 mg 4 times daily for 7 days, dispense  #28.  3.  Ambien 10 mg p.o. at h.s. as needed for sleep, dispense #12.  4.  Synthroid 200 mcg p.o. daily.   FOLLOW UP:  The patient will be contacted by the office later today to  schedule her followup for next week.   OTHER INSTRUCTIONS:  The patient is instructed to lift no more than 5  pounds.  She is cautioned against repetitive arm movements.  I have asked  her to walk at least four times daily.  She is to keep the head of her bed  elevated at all times.  I have further advised that she may remove her  dressings tomorrow.  She is to keep the drain  sites covered with sterile gauze at all times.  She will empty, record, and  recharge her JP drains twice daily, and bring these records to the office at  followup.   FINAL DISCHARGE DIAGNOSIS:  Left breast carcinoma.      Alfredo Bach  Doristine Church, MD  Electronically Signed     CWB/MEDQ  D:  02/24/2006  T:  02/24/2006  Job:  [id number]

## 2011-09-29 ENCOUNTER — Ambulatory Visit: Payer: 59

## 2011-09-29 DIAGNOSIS — D7289 Other specified disorders of white blood cells: Secondary | ICD-10-CM

## 2011-09-29 DIAGNOSIS — J020 Streptococcal pharyngitis: Secondary | ICD-10-CM

## 2011-09-29 DIAGNOSIS — R509 Fever, unspecified: Secondary | ICD-10-CM

## 2011-09-30 ENCOUNTER — Ambulatory Visit (INDEPENDENT_AMBULATORY_CARE_PROVIDER_SITE_OTHER): Payer: 59

## 2011-09-30 ENCOUNTER — Ambulatory Visit: Payer: 59

## 2011-09-30 DIAGNOSIS — D7289 Other specified disorders of white blood cells: Secondary | ICD-10-CM

## 2011-09-30 DIAGNOSIS — R509 Fever, unspecified: Secondary | ICD-10-CM

## 2011-09-30 DIAGNOSIS — J029 Acute pharyngitis, unspecified: Secondary | ICD-10-CM

## 2012-01-25 ENCOUNTER — Other Ambulatory Visit (HOSPITAL_BASED_OUTPATIENT_CLINIC_OR_DEPARTMENT_OTHER): Payer: 59 | Admitting: Lab

## 2012-01-25 DIAGNOSIS — C50419 Malignant neoplasm of upper-outer quadrant of unspecified female breast: Secondary | ICD-10-CM

## 2012-01-25 DIAGNOSIS — Z170 Estrogen receptor positive status [ER+]: Secondary | ICD-10-CM

## 2012-01-25 DIAGNOSIS — Z8571 Personal history of Hodgkin lymphoma: Secondary | ICD-10-CM

## 2012-01-25 LAB — CBC WITH DIFFERENTIAL/PLATELET
Basophils Absolute: 0.1 10*3/uL (ref 0.0–0.1)
EOS%: 5.1 % (ref 0.0–7.0)
Eosinophils Absolute: 0.5 10*3/uL (ref 0.0–0.5)
HGB: 12.9 g/dL (ref 11.6–15.9)
MCV: 90.8 fL (ref 79.5–101.0)
MONO%: 7.8 % (ref 0.0–14.0)
NEUT#: 4.1 10*3/uL (ref 1.5–6.5)
RBC: 4.22 10*6/uL (ref 3.70–5.45)
RDW: 14.5 % (ref 11.2–14.5)
lymph#: 4 10*3/uL — ABNORMAL HIGH (ref 0.9–3.3)
nRBC: 0 % (ref 0–0)

## 2012-01-25 LAB — COMPREHENSIVE METABOLIC PANEL
ALT: 16 U/L (ref 0–35)
Albumin: 3.8 g/dL (ref 3.5–5.2)
Alkaline Phosphatase: 76 U/L (ref 39–117)
CO2: 26 mEq/L (ref 19–32)
Glucose, Bld: 95 mg/dL (ref 70–99)
Potassium: 4.6 mEq/L (ref 3.5–5.3)
Sodium: 137 mEq/L (ref 135–145)
Total Bilirubin: 0.4 mg/dL (ref 0.3–1.2)
Total Protein: 8 g/dL (ref 6.0–8.3)

## 2012-02-01 ENCOUNTER — Ambulatory Visit: Payer: 59 | Admitting: Oncology

## 2012-11-23 ENCOUNTER — Telehealth: Payer: Self-pay | Admitting: Oncology

## 2012-11-23 ENCOUNTER — Telehealth: Payer: Self-pay | Admitting: *Deleted

## 2012-11-23 NOTE — Telephone Encounter (Signed)
Message received from " Pat at [facility] medical "- reqarding faxed request to schedule an appointment for follow up for hx of breast ca and lymphoma.  Per chart review noted pt was scheduled for lab 01/25/2012 and MD appt on 02/01/2012. Pt had lab draw but was a no show for MD.  This RN called to Dennie Bible at given number of [phone].  Message left for Dennie Bible stating data not received by this RN- direct fax number and phone number given for return call if appointment needed is due to urgent need or follow up per missed appt.  Pat returned call to this RN's VM stating non urgent appointment needed and she will refax office notes to direct fax number.

## 2012-11-23 NOTE — Telephone Encounter (Signed)
lmonvm adviisng the pt of her appt with dr Darnelle Catalan on 11/27/2012.

## 2012-11-27 ENCOUNTER — Ambulatory Visit (HOSPITAL_BASED_OUTPATIENT_CLINIC_OR_DEPARTMENT_OTHER): Payer: 59 | Admitting: Oncology

## 2012-11-27 VITALS — BP 118/75 | HR 109 | Temp 97.5°F | Resp 20 | Wt 176.4 lb

## 2012-11-27 DIAGNOSIS — Z853 Personal history of malignant neoplasm of breast: Secondary | ICD-10-CM | POA: Insufficient documentation

## 2012-11-27 DIAGNOSIS — Z9010 Acquired absence of unspecified breast and nipple: Secondary | ICD-10-CM

## 2012-11-27 DIAGNOSIS — C50919 Malignant neoplasm of unspecified site of unspecified female breast: Secondary | ICD-10-CM

## 2012-11-27 DIAGNOSIS — Z9089 Acquired absence of other organs: Secondary | ICD-10-CM

## 2012-11-27 DIAGNOSIS — Z8571 Personal history of Hodgkin lymphoma: Secondary | ICD-10-CM

## 2012-11-27 NOTE — Progress Notes (Signed)
ID: Lauren Chandler   DOB: Feb 22, 1966  MR#: [id number]  WUJ#:[id number]  PCP: Lauren Grippe, MD GYN: Lauren Chandler SU: Lauren Chandler OTHER MD:   HISTORY OF PRESENT ILLNESS: The patient had a routine screening mammogram when she turned 39 on December 23, 2005 at [facility].  This showed an abnormality in the left breast, which was confirmed by left diagnostic mammography on March 20.  Ultrasound the same day showed a 1 x 1 cm irregular hypoechoic mass at the 2 o'clock position of the left breast.  Biopsy was performed on the same day and showed ([id number] and [id number]) an invasive mammary carcinoma, which was ER positive at 4%, PR negative and HER2/neu negative by FISH.  With this information the patient was referred to Dr. Jamey Chandler and bilateral breast MRIs were obtained on 01/19/2006.  This showed only the mass in question, which measured 1.8 cm maximally by MRI.  At this point the fact that the patient had history of Hodgkin's disease remotely with mantle cell irradiation at the [facility] meant that she would not be able to receive radiation treatment after lumpectomy and therefore mastectomy was her only choice.  The patient opted for bilateral mastectomies with implant reconstruction and this was performed Feb 22, 2006 under Dr. Jamey Chandler with Dr. Kayleen Chandler assistance. Her subsequent history is as detailed below  INTERVAL HISTORY: Lauren Chandler returns today after an absence of nearly 2 years. Basically she saw her primary care physician, Dr. Selena Chandler, and she suggested she stop by at least to make sure she did not have to see as regularly. She herself prefers not to have routine followups here unless absolutely necessary. This is discussed further below.  REVIEW OF SYSTEMS: She has had no unusual headaches, visual changes, cough, phlegm production, pleurisy, shortness of breath, palpitations, chest pain or pressure, change in bowel or bladder habits, fever, rash, bleeding, or any evidence of adenopathy. In short a  detailed review of systems today was entirely negative.  PAST MEDICAL HISTORY: No past medical history on file. Significant for Hodgkin's disease.  She is status post splenectomy.  She received mantle radiation and she is hypothyroid as expected.  The patient has received Pneumovax vaccines in the past although she does not recall when the last one was.  She also needs the the H. flu and meningococcal vaccines.  The only other medical issue is the right knee arthroscopy which she had secondary to basketball and other sports injuries.  PAST SURGICAL HISTORY: No past surgical history on file.  FAMILY HISTORY No family history on file. The patient's are in their late 71s  The patient has five brothers.  There is no history of cancer in the immediate family.  The patient's father's mother died from breast cancer in her mid 13s.  The patient believes the patient's mother's mother had colon cancer.  The patient's mother's father had prostate cancer.  There is no history of ovarian cancer in the family  GYNECOLOGIC HISTORY: Menarche age 33, first live birth age 21, menopause about 2007.  SOCIAL HISTORY: Lauren Chandler teaches third grade.  [name] works at [employer] in [employer].  There are three boys who are 18, 17, and 13 currently.  The family at tends [facility] [place]  ADVANCED DIRECTIVES:  HEALTH MAINTENANCE: History  Substance Use Topics  . Smoking status: Not on file  . Smokeless tobacco: Not on file  . Alcohol Use: Not on file     Colonoscopy:  PAP:  Bone density:  Lipid panel:  Allergies not on file  No current outpatient prescriptions on file.    OBJECTIVE: Middle-aged white woman who appears anxious Filed Vitals:   11/27/12 1215  BP: 118/75  Pulse: 109  Temp: 97.5 F (36.4 C)  Resp: 20     There is no height on file to calculate BMI.    ECOG FS: 0  Sclerae unicteric Oropharynx clear No cervical or supraclavicular adenopathy Lungs no rales or rhonchi Heart regular  rate and rhythm Abd benign MSK no focal spinal tenderness, no peripheral edema Neuro: nonfocal Breasts: Status post bilateral mastectomies, with implant reconstruction. There is some irregularity in the medial aspect of the left implant, which is chronic. There are no hard lumps on either side to suggest local recurrence. Both axillae are benign.   LAB RESULTS: Lab Results  Component Value Date   WBC 9.4 01/25/2012   NEUTROABS 4.1 01/25/2012   HGB 12.9 01/25/2012   HCT 38.4 01/25/2012   MCV 90.8 01/25/2012   PLT 365 01/25/2012      Chemistry      Component Value Date/Time   NA 137 01/25/2012 0804   K 4.6 01/25/2012 0804   CL 104 01/25/2012 0804   CO2 26 01/25/2012 0804   BUN 13 01/25/2012 0804   CREATININE 0.93 01/25/2012 0804      Component Value Date/Time   CALCIUM 9.3 01/25/2012 0804   ALKPHOS 76 01/25/2012 0804   AST 17 01/25/2012 0804   ALT 16 01/25/2012 0804   BILITOT 0.4 01/25/2012 0804       Lab Results  Component Value Date   LABCA2 13 01/25/2012    No components found with this basename: [id number]    No results found for this basename: INR:1;PROTIME:1 in the last 168 hours  Urinalysis    Component Value Date/Time   LABSPEC 1.025 07/19/2006 1247    STUDIES: No results found.  ASSESSMENT: 47 y.o. 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  ER positive at 4%, PR negative and HER-2/neu with MIB-1 of 40%.   (2) Status post four dose dense cycles of doxorubicin/cyclophosphamide followed by weekly paclitaxel x9, all completed in October of 2007.    (3) Tamoxifen was started in January of 2008 despite a very low ER positivity, patient discontinuing it in May of 2010.  (4) history of remote hodgkin's disease, s/p mantle radiation, s.p splenectomy  PLAN: I am comfortable with Lauren Chandler being followed through Dr. Elmyra Chandler office in the future. She will not need  mammography, but only a yearly physician breast exam. As far as her remote Hodgkin's disease, patient's who received mantle radiation generally are hypothyroid. Patients who have undergone splenectomy need to have influenza, meningococcus, and pneumococcal vaccines every 5 years. Today I also advised Mccreadie, again, that if she has a high fever at any point she needs to start antibiotics within 5 hours because of the risk of fulminant infection in splenectomized patients  At this point we are making no further appointments for her here, though of course I will always been that to see her in the future if the need arises   Jewelianna Pancoast C    11/27/2012

## 2015-05-21 ENCOUNTER — Other Ambulatory Visit: Payer: Self-pay | Admitting: Obstetrics and Gynecology

## 2015-05-22 LAB — CYTOLOGY - PAP

## 2016-10-14 ENCOUNTER — Other Ambulatory Visit: Payer: Self-pay | Admitting: Gastroenterology

## 2016-10-14 DIAGNOSIS — R1310 Dysphagia, unspecified: Secondary | ICD-10-CM

## 2016-11-01 ENCOUNTER — Other Ambulatory Visit: Payer: Self-pay

## 2016-11-08 ENCOUNTER — Ambulatory Visit
Admission: RE | Admit: 2016-11-08 | Discharge: 2016-11-08 | Disposition: A | Discharge status: Home / Self Care | Payer: 59 | Source: Ambulatory Visit | Attending: Gastroenterology | Admitting: Gastroenterology

## 2016-11-08 DIAGNOSIS — K219 Gastro-esophageal reflux disease without esophagitis: Secondary | ICD-10-CM | POA: Diagnosis not present

## 2016-11-08 DIAGNOSIS — R1310 Dysphagia, unspecified: Secondary | ICD-10-CM

## 2016-12-30 DIAGNOSIS — Z0000 Encounter for general adult medical examination without abnormal findings: Secondary | ICD-10-CM | POA: Diagnosis not present

## 2016-12-30 DIAGNOSIS — E039 Hypothyroidism, unspecified: Secondary | ICD-10-CM | POA: Diagnosis not present

## 2016-12-30 DIAGNOSIS — E7800 Pure hypercholesterolemia, unspecified: Secondary | ICD-10-CM | POA: Diagnosis not present

## 2017-01-10 DIAGNOSIS — R319 Hematuria, unspecified: Secondary | ICD-10-CM | POA: Diagnosis not present

## 2017-01-10 DIAGNOSIS — K219 Gastro-esophageal reflux disease without esophagitis: Secondary | ICD-10-CM | POA: Diagnosis not present

## 2017-01-10 DIAGNOSIS — E7800 Pure hypercholesterolemia, unspecified: Secondary | ICD-10-CM | POA: Diagnosis not present

## 2017-01-10 DIAGNOSIS — R7989 Other specified abnormal findings of blood chemistry: Secondary | ICD-10-CM | POA: Diagnosis not present

## 2017-01-10 DIAGNOSIS — Z0000 Encounter for general adult medical examination without abnormal findings: Secondary | ICD-10-CM | POA: Diagnosis not present

## 2017-02-21 DIAGNOSIS — E7800 Pure hypercholesterolemia, unspecified: Secondary | ICD-10-CM | POA: Diagnosis not present

## 2017-02-21 DIAGNOSIS — E039 Hypothyroidism, unspecified: Secondary | ICD-10-CM | POA: Diagnosis not present

## 2017-03-23 DIAGNOSIS — R748 Abnormal levels of other serum enzymes: Secondary | ICD-10-CM | POA: Diagnosis not present

## 2017-06-02 DIAGNOSIS — Z01419 Encounter for gynecological examination (general) (routine) without abnormal findings: Secondary | ICD-10-CM | POA: Diagnosis not present

## 2017-07-06 DIAGNOSIS — E7800 Pure hypercholesterolemia, unspecified: Secondary | ICD-10-CM | POA: Diagnosis not present

## 2017-07-13 DIAGNOSIS — E7800 Pure hypercholesterolemia, unspecified: Secondary | ICD-10-CM | POA: Diagnosis not present

## 2017-07-13 DIAGNOSIS — Z0000 Encounter for general adult medical examination without abnormal findings: Secondary | ICD-10-CM | POA: Diagnosis not present

## 2017-07-13 DIAGNOSIS — E039 Hypothyroidism, unspecified: Secondary | ICD-10-CM | POA: Diagnosis not present

## 2018-01-12 DIAGNOSIS — E7800 Pure hypercholesterolemia, unspecified: Secondary | ICD-10-CM | POA: Diagnosis not present

## 2018-01-12 DIAGNOSIS — Z0000 Encounter for general adult medical examination without abnormal findings: Secondary | ICD-10-CM | POA: Diagnosis not present

## 2018-01-12 DIAGNOSIS — R319 Hematuria, unspecified: Secondary | ICD-10-CM | POA: Diagnosis not present

## 2018-01-12 DIAGNOSIS — E039 Hypothyroidism, unspecified: Secondary | ICD-10-CM | POA: Diagnosis not present

## 2018-01-25 DIAGNOSIS — E7800 Pure hypercholesterolemia, unspecified: Secondary | ICD-10-CM | POA: Diagnosis not present

## 2018-01-25 DIAGNOSIS — E039 Hypothyroidism, unspecified: Secondary | ICD-10-CM | POA: Diagnosis not present

## 2018-01-25 DIAGNOSIS — R05 Cough: Secondary | ICD-10-CM | POA: Diagnosis not present

## 2018-05-02 DIAGNOSIS — E039 Hypothyroidism, unspecified: Secondary | ICD-10-CM | POA: Diagnosis not present

## 2018-05-02 DIAGNOSIS — E7800 Pure hypercholesterolemia, unspecified: Secondary | ICD-10-CM | POA: Diagnosis not present

## 2018-05-05 DIAGNOSIS — E7800 Pure hypercholesterolemia, unspecified: Secondary | ICD-10-CM | POA: Diagnosis not present

## 2018-05-05 DIAGNOSIS — E039 Hypothyroidism, unspecified: Secondary | ICD-10-CM | POA: Diagnosis not present

## 2018-05-25 ENCOUNTER — Telehealth: Payer: Self-pay | Admitting: Genetics

## 2018-05-25 ENCOUNTER — Encounter: Payer: Self-pay | Admitting: Genetics

## 2018-05-25 NOTE — Telephone Encounter (Signed)
A genetic counseling appointment has been scheduled for the pt to see [name] on 9/9 at 4pm. Letter mailed to the pt.

## 2018-07-03 ENCOUNTER — Telehealth: Payer: Self-pay | Admitting: Genetics

## 2018-07-03 ENCOUNTER — Encounter: Payer: 59 | Admitting: Genetics

## 2018-07-03 NOTE — Telephone Encounter (Signed)
Followed up with patient regarding her missed appointment 9/9 at 4pm.  She would like to reschedule, but did not have her calender with I provided her with my phone number and the new patient referral number to reschedule when she is ready.

## 2018-07-13 DIAGNOSIS — Z853 Personal history of malignant neoplasm of breast: Secondary | ICD-10-CM | POA: Diagnosis not present

## 2018-07-13 DIAGNOSIS — Z8042 Family history of malignant neoplasm of prostate: Secondary | ICD-10-CM | POA: Diagnosis not present

## 2018-07-13 DIAGNOSIS — Z803 Family history of malignant neoplasm of breast: Secondary | ICD-10-CM | POA: Diagnosis not present

## 2018-07-13 DIAGNOSIS — Z01419 Encounter for gynecological examination (general) (routine) without abnormal findings: Secondary | ICD-10-CM | POA: Diagnosis not present

## 2018-07-13 DIAGNOSIS — Z6829 Body mass index (BMI) 29.0-29.9, adult: Secondary | ICD-10-CM | POA: Diagnosis not present

## 2018-07-13 DIAGNOSIS — R319 Hematuria, unspecified: Secondary | ICD-10-CM | POA: Diagnosis not present

## 2018-08-23 DIAGNOSIS — Z809 Family history of malignant neoplasm, unspecified: Secondary | ICD-10-CM | POA: Diagnosis not present

## 2019-01-08 DIAGNOSIS — Z1502 Genetic susceptibility to malignant neoplasm of ovary: Secondary | ICD-10-CM | POA: Diagnosis not present

## 2019-01-08 DIAGNOSIS — R180 Malignant ascites: Secondary | ICD-10-CM | POA: Insufficient documentation

## 2019-01-08 DIAGNOSIS — Z1501 Genetic susceptibility to malignant neoplasm of breast: Secondary | ICD-10-CM | POA: Insufficient documentation

## 2019-01-08 DIAGNOSIS — K5909 Other constipation: Secondary | ICD-10-CM | POA: Insufficient documentation

## 2019-01-08 HISTORY — DX: Genetic susceptibility to malignant neoplasm of breast: Z15.01

## 2019-04-12 DIAGNOSIS — Z1501 Genetic susceptibility to malignant neoplasm of breast: Secondary | ICD-10-CM | POA: Diagnosis not present

## 2019-04-23 ENCOUNTER — Other Ambulatory Visit: Payer: Self-pay | Admitting: Obstetrics and Gynecology

## 2019-04-23 DIAGNOSIS — N72 Inflammatory disease of cervix uteri: Secondary | ICD-10-CM | POA: Diagnosis not present

## 2019-04-23 DIAGNOSIS — N840 Polyp of corpus uteri: Secondary | ICD-10-CM | POA: Diagnosis not present

## 2019-04-23 DIAGNOSIS — Z4002 Encounter for prophylactic removal of ovary: Secondary | ICD-10-CM | POA: Diagnosis not present

## 2019-04-23 DIAGNOSIS — N800 Endometriosis of uterus: Secondary | ICD-10-CM | POA: Diagnosis not present

## 2019-04-23 DIAGNOSIS — N736 Female pelvic peritoneal adhesions (postinfective): Secondary | ICD-10-CM | POA: Diagnosis not present

## 2019-04-23 DIAGNOSIS — Z1502 Genetic susceptibility to malignant neoplasm of ovary: Secondary | ICD-10-CM | POA: Diagnosis not present

## 2019-04-23 HISTORY — PX: OTHER SURGICAL HISTORY: SHX169

## 2019-09-10 DIAGNOSIS — L821 Other seborrheic keratosis: Secondary | ICD-10-CM | POA: Diagnosis not present

## 2019-09-10 DIAGNOSIS — D1801 Hemangioma of skin and subcutaneous tissue: Secondary | ICD-10-CM | POA: Diagnosis not present

## 2019-09-10 DIAGNOSIS — D235 Other benign neoplasm of skin of trunk: Secondary | ICD-10-CM | POA: Diagnosis not present

## 2019-09-10 DIAGNOSIS — L570 Actinic keratosis: Secondary | ICD-10-CM | POA: Diagnosis not present

## 2019-09-10 DIAGNOSIS — L820 Inflamed seborrheic keratosis: Secondary | ICD-10-CM | POA: Diagnosis not present

## 2019-09-10 DIAGNOSIS — B079 Viral wart, unspecified: Secondary | ICD-10-CM | POA: Diagnosis not present

## 2019-09-10 DIAGNOSIS — D2362 Other benign neoplasm of skin of left upper limb, including shoulder: Secondary | ICD-10-CM | POA: Diagnosis not present

## 2019-09-10 DIAGNOSIS — L812 Freckles: Secondary | ICD-10-CM | POA: Diagnosis not present

## 2019-12-22 ENCOUNTER — Ambulatory Visit: Payer: Self-pay | Attending: Internal Medicine

## 2019-12-22 DIAGNOSIS — Z23 Encounter for immunization: Secondary | ICD-10-CM | POA: Insufficient documentation

## 2019-12-22 NOTE — Progress Notes (Signed)
   Covid-19 Vaccination Clinic  Name:  Lauren Chandler    MRN: [id number] DOB: [birth year]  12/22/2019  Lauren Chandler was observed post Covid-19 immunization for 15 minutes without incidence. She was provided with Vaccine Information Sheet and instruction to access the V-Safe system.   Lauren Chandler was instructed to call 911 with any severe reactions post vaccine: Marland Kitchen Difficulty breathing  . Swelling of your face and throat  . A fast heartbeat  . A bad rash all over your body  . Dizziness and weakness    Immunizations Administered    Name Date Dose VIS Date Route   Pfizer COVID-19 Vaccine 12/22/2019  1:35 PM 0.3 mL 10/05/2019 Intramuscular   Manufacturer: [place]   Lot: [id number]   [place]: [id number]

## 2020-01-12 ENCOUNTER — Ambulatory Visit: Payer: Self-pay

## 2020-01-15 ENCOUNTER — Ambulatory Visit: Payer: Self-pay | Attending: Internal Medicine

## 2020-01-15 DIAGNOSIS — Z23 Encounter for immunization: Secondary | ICD-10-CM

## 2020-01-15 NOTE — Progress Notes (Signed)
   Covid-19 Vaccination Clinic  Name:  Lauren Chandler    MRN: [id number] DOB: November 05, 1965  01/15/2020  [name] was observed post Covid-19 immunization for 15 minutes without incident. She was provided with Vaccine Information Sheet and instruction to access the V-Safe system.   [name] was instructed to call 911 with any severe reactions post vaccine: Marland Kitchen Difficulty breathing  . Swelling of face and throat  . A fast heartbeat  . A bad rash all over body  . Dizziness and weakness   Immunizations Administered    Name Date Dose VIS Date Route   Pfizer COVID-19 Vaccine 01/15/2020  3:58 PM 0.3 mL 10/05/2019 Intramuscular   Manufacturer: [place]   Lot: [id number]   [place]: [id number]

## 2020-01-16 ENCOUNTER — Ambulatory Visit: Payer: Self-pay

## 2020-05-15 DIAGNOSIS — E039 Hypothyroidism, unspecified: Secondary | ICD-10-CM | POA: Diagnosis not present

## 2020-05-15 DIAGNOSIS — R5383 Other fatigue: Secondary | ICD-10-CM | POA: Diagnosis not present

## 2020-05-15 DIAGNOSIS — Z1331 Encounter for screening for depression: Secondary | ICD-10-CM | POA: Diagnosis not present

## 2020-05-15 DIAGNOSIS — F418 Other specified anxiety disorders: Secondary | ICD-10-CM | POA: Diagnosis not present

## 2020-05-15 DIAGNOSIS — R110 Nausea: Secondary | ICD-10-CM | POA: Diagnosis not present

## 2020-05-19 DIAGNOSIS — E039 Hypothyroidism, unspecified: Secondary | ICD-10-CM | POA: Diagnosis not present

## 2020-06-17 DIAGNOSIS — E039 Hypothyroidism, unspecified: Secondary | ICD-10-CM | POA: Diagnosis not present

## 2020-06-17 DIAGNOSIS — M25561 Pain in right knee: Secondary | ICD-10-CM | POA: Diagnosis not present

## 2020-06-25 DIAGNOSIS — S82001A Unspecified fracture of right patella, initial encounter for closed fracture: Secondary | ICD-10-CM | POA: Diagnosis not present

## 2020-08-20 DIAGNOSIS — E039 Hypothyroidism, unspecified: Secondary | ICD-10-CM | POA: Diagnosis not present

## 2020-12-11 DIAGNOSIS — E785 Hyperlipidemia, unspecified: Secondary | ICD-10-CM | POA: Diagnosis not present

## 2020-12-11 DIAGNOSIS — E039 Hypothyroidism, unspecified: Secondary | ICD-10-CM | POA: Diagnosis not present

## 2020-12-18 DIAGNOSIS — Z0000 Encounter for general adult medical examination without abnormal findings: Secondary | ICD-10-CM | POA: Diagnosis not present

## 2020-12-18 DIAGNOSIS — Z1331 Encounter for screening for depression: Secondary | ICD-10-CM | POA: Diagnosis not present

## 2020-12-18 DIAGNOSIS — E039 Hypothyroidism, unspecified: Secondary | ICD-10-CM | POA: Diagnosis not present

## 2021-03-19 DIAGNOSIS — E785 Hyperlipidemia, unspecified: Secondary | ICD-10-CM | POA: Diagnosis not present

## 2021-03-19 DIAGNOSIS — E039 Hypothyroidism, unspecified: Secondary | ICD-10-CM | POA: Diagnosis not present

## 2021-03-19 DIAGNOSIS — Z23 Encounter for immunization: Secondary | ICD-10-CM | POA: Diagnosis not present

## 2021-07-29 DIAGNOSIS — M25551 Pain in right hip: Secondary | ICD-10-CM | POA: Diagnosis not present

## 2021-08-17 DIAGNOSIS — R300 Dysuria: Secondary | ICD-10-CM | POA: Diagnosis not present

## 2021-09-15 DIAGNOSIS — R051 Acute cough: Secondary | ICD-10-CM | POA: Diagnosis not present

## 2021-09-15 DIAGNOSIS — R0981 Nasal congestion: Secondary | ICD-10-CM | POA: Diagnosis not present

## 2021-11-25 DIAGNOSIS — J039 Acute tonsillitis, unspecified: Secondary | ICD-10-CM | POA: Diagnosis not present

## 2021-11-25 DIAGNOSIS — J029 Acute pharyngitis, unspecified: Secondary | ICD-10-CM | POA: Diagnosis not present

## 2021-11-25 DIAGNOSIS — R0981 Nasal congestion: Secondary | ICD-10-CM | POA: Diagnosis not present

## 2021-12-10 DIAGNOSIS — E785 Hyperlipidemia, unspecified: Secondary | ICD-10-CM | POA: Diagnosis not present

## 2021-12-10 DIAGNOSIS — E039 Hypothyroidism, unspecified: Secondary | ICD-10-CM | POA: Diagnosis not present

## 2021-12-17 DIAGNOSIS — E039 Hypothyroidism, unspecified: Secondary | ICD-10-CM | POA: Diagnosis not present

## 2021-12-17 DIAGNOSIS — Z1331 Encounter for screening for depression: Secondary | ICD-10-CM | POA: Diagnosis not present

## 2021-12-17 DIAGNOSIS — Z1339 Encounter for screening examination for other mental health and behavioral disorders: Secondary | ICD-10-CM | POA: Diagnosis not present

## 2021-12-17 DIAGNOSIS — Z0000 Encounter for general adult medical examination without abnormal findings: Secondary | ICD-10-CM | POA: Diagnosis not present

## 2021-12-28 DIAGNOSIS — M6281 Muscle weakness (generalized): Secondary | ICD-10-CM | POA: Diagnosis not present

## 2021-12-28 DIAGNOSIS — M25612 Stiffness of left shoulder, not elsewhere classified: Secondary | ICD-10-CM | POA: Diagnosis not present

## 2021-12-28 DIAGNOSIS — S46012D Strain of muscle(s) and tendon(s) of the rotator cuff of left shoulder, subsequent encounter: Secondary | ICD-10-CM | POA: Diagnosis not present

## 2022-01-06 DIAGNOSIS — S46012D Strain of muscle(s) and tendon(s) of the rotator cuff of left shoulder, subsequent encounter: Secondary | ICD-10-CM | POA: Diagnosis not present

## 2022-01-06 DIAGNOSIS — M6281 Muscle weakness (generalized): Secondary | ICD-10-CM | POA: Diagnosis not present

## 2022-01-06 DIAGNOSIS — M25612 Stiffness of left shoulder, not elsewhere classified: Secondary | ICD-10-CM | POA: Diagnosis not present

## 2022-01-11 DIAGNOSIS — M6281 Muscle weakness (generalized): Secondary | ICD-10-CM | POA: Diagnosis not present

## 2022-01-11 DIAGNOSIS — M25612 Stiffness of left shoulder, not elsewhere classified: Secondary | ICD-10-CM | POA: Diagnosis not present

## 2022-01-11 DIAGNOSIS — S46012D Strain of muscle(s) and tendon(s) of the rotator cuff of left shoulder, subsequent encounter: Secondary | ICD-10-CM | POA: Diagnosis not present

## 2022-01-18 DIAGNOSIS — M6281 Muscle weakness (generalized): Secondary | ICD-10-CM | POA: Diagnosis not present

## 2022-01-18 DIAGNOSIS — S46012D Strain of muscle(s) and tendon(s) of the rotator cuff of left shoulder, subsequent encounter: Secondary | ICD-10-CM | POA: Diagnosis not present

## 2022-01-18 DIAGNOSIS — M25612 Stiffness of left shoulder, not elsewhere classified: Secondary | ICD-10-CM | POA: Diagnosis not present

## 2022-04-02 ENCOUNTER — Other Ambulatory Visit: Payer: Self-pay | Admitting: Internal Medicine

## 2022-04-02 DIAGNOSIS — E785 Hyperlipidemia, unspecified: Secondary | ICD-10-CM

## 2022-05-25 ENCOUNTER — Encounter: Payer: Self-pay | Admitting: Oncology

## 2022-05-26 ENCOUNTER — Ambulatory Visit
Admission: RE | Admit: 2022-05-26 | Discharge: 2022-05-26 | Disposition: A | Discharge status: Home / Self Care | Payer: No Typology Code available for payment source | Source: Ambulatory Visit | Attending: Internal Medicine | Admitting: Internal Medicine

## 2022-05-26 ENCOUNTER — Encounter: Payer: Self-pay | Admitting: Oncology

## 2022-05-26 DIAGNOSIS — E785 Hyperlipidemia, unspecified: Secondary | ICD-10-CM

## 2022-05-26 DIAGNOSIS — I700 Atherosclerosis of aorta: Secondary | ICD-10-CM | POA: Diagnosis not present

## 2022-06-29 DIAGNOSIS — E039 Hypothyroidism, unspecified: Secondary | ICD-10-CM | POA: Diagnosis not present

## 2022-06-29 DIAGNOSIS — Z131 Encounter for screening for diabetes mellitus: Secondary | ICD-10-CM | POA: Diagnosis not present

## 2022-06-29 DIAGNOSIS — E669 Obesity, unspecified: Secondary | ICD-10-CM | POA: Diagnosis not present

## 2022-06-29 DIAGNOSIS — R300 Dysuria: Secondary | ICD-10-CM | POA: Diagnosis not present

## 2022-07-08 DIAGNOSIS — N390 Urinary tract infection, site not specified: Secondary | ICD-10-CM | POA: Diagnosis not present

## 2022-07-16 DIAGNOSIS — Z6832 Body mass index (BMI) 32.0-32.9, adult: Secondary | ICD-10-CM | POA: Diagnosis not present

## 2022-07-16 DIAGNOSIS — R140 Abdominal distension (gaseous): Secondary | ICD-10-CM | POA: Diagnosis not present

## 2022-07-16 DIAGNOSIS — Z1272 Encounter for screening for malignant neoplasm of vagina: Secondary | ICD-10-CM | POA: Diagnosis not present

## 2022-07-16 DIAGNOSIS — Z01419 Encounter for gynecological examination (general) (routine) without abnormal findings: Secondary | ICD-10-CM | POA: Diagnosis not present

## 2022-07-16 DIAGNOSIS — R319 Hematuria, unspecified: Secondary | ICD-10-CM | POA: Diagnosis not present

## 2022-07-19 ENCOUNTER — Other Ambulatory Visit: Payer: Self-pay | Admitting: Obstetrics and Gynecology

## 2022-07-19 DIAGNOSIS — C786 Secondary malignant neoplasm of retroperitoneum and peritoneum: Secondary | ICD-10-CM | POA: Diagnosis not present

## 2022-07-19 DIAGNOSIS — R188 Other ascites: Secondary | ICD-10-CM | POA: Diagnosis not present

## 2022-07-19 DIAGNOSIS — R140 Abdominal distension (gaseous): Secondary | ICD-10-CM

## 2022-07-20 ENCOUNTER — Other Ambulatory Visit: Payer: Self-pay

## 2022-07-20 ENCOUNTER — Telehealth: Payer: Self-pay

## 2022-07-20 ENCOUNTER — Ambulatory Visit
Admission: RE | Admit: 2022-07-20 | Discharge: 2022-07-20 | Disposition: A | Discharge status: Home / Self Care | Payer: Self-pay | Source: Ambulatory Visit | Attending: Gynecologic Oncology | Admitting: Gynecologic Oncology

## 2022-07-20 DIAGNOSIS — K219 Gastro-esophageal reflux disease without esophagitis: Secondary | ICD-10-CM | POA: Diagnosis not present

## 2022-07-20 DIAGNOSIS — R140 Abdominal distension (gaseous): Secondary | ICD-10-CM | POA: Diagnosis not present

## 2022-07-20 DIAGNOSIS — R933 Abnormal findings on diagnostic imaging of other parts of digestive tract: Secondary | ICD-10-CM | POA: Diagnosis not present

## 2022-07-20 DIAGNOSIS — C786 Secondary malignant neoplasm of retroperitoneum and peritoneum: Secondary | ICD-10-CM

## 2022-07-20 DIAGNOSIS — R109 Unspecified abdominal pain: Secondary | ICD-10-CM | POA: Diagnosis not present

## 2022-07-20 NOTE — Telephone Encounter (Signed)
Spoke with Ms.Mcraney regarding her referral to GYN oncology. She has an appointment scheduled with Dr. Berline Lopes on 07/23/22 at 9:00am. Patient agrees to date and time. She has been provided with office address and location. She is also aware of our mask and visitor policy. Patient verbalized understanding and will call with any questions.

## 2022-07-21 ENCOUNTER — Encounter: Payer: Self-pay | Admitting: Gynecologic Oncology

## 2022-07-21 ENCOUNTER — Ambulatory Visit (HOSPITAL_BASED_OUTPATIENT_CLINIC_OR_DEPARTMENT_OTHER): Payer: BC Managed Care – PPO | Admitting: Cardiology

## 2022-07-22 NOTE — Progress Notes (Signed)
GYNECOLOGIC ONCOLOGY NEW PATIENT CONSULTATION   Patient Name: Lauren Chandler  Patient Age: 56 y.o. Date of Service: 07/23/22 Referring Provider: Dr. 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  I reviewed imaging findings with the patient and her husband.  We looked at the images together.  Discussed findings concerning for metastatic disease including ascites, omental caking, carcinomatosis, and small pleural effusion.  I reviewed that risk-reducing surgery in the setting of a BRCA mutation significantly decreases the risk of ovarian and fallopian tube cancer; however, there remains a small risk of primary peritoneal cancer.  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  Plan to get a CA-125 and CEA today.  I discussed that the treatment approach for this disease is typically combination of cytoreductive surgery and chemotherapy. I discussed that sequencing of this can be either with upfront debulking followed by adjuvant chemotherapy sequentially or neoadjuvant chemotherapy followed by an interval cytoreductive attempt, then additional chemotherapy. This latter approach is associated with a reduced perioperative morbidity at the time of surgery.  I discussed that decisions regarding sequencing of therapy is individualized taking into account individual patient health, in addition to the apparent tumor distribution on imaging, and likelihood of complete surgical resection at the time of surgery. I discussed that the overall survival observed in patients is equivalent for both approaches (neoadjuvant chemotherapy versus primary debulking surgery) provided that there is an optimal cytoreductive effort at the time of  surgery (regardless of the timing of that surgery). Given the observed decreased morbidity of neoadjuvant chemotherapy in the setting of her significant disease burden, with preserved survival outcomes, I favor this approach for her.    The patient is quite symptomatic.  I placed an order this morning before her visit to get her scheduled for a paracentesis, which was scheduled on Mondays, 10/2.  Given how symptomatic she is, I will have the office reach out to see if we can get this scheduled for later today.  I reviewed her lab work from a week ago.  This was notable for hyperkalemia and evidence of dehydration (creatinine was 1.28).  The patient has continued to struggle with p.o. intake given early satiety and decreased appetite.  BMP was repeated today.  Given her pulse in the 120s and lab work from last week, I suspect that she will have evidence of dehydration again today.  If this is the case, we will get her scheduled for IV fluid infusion either today or tomorrow.  Patient endorses anxiety related to symptoms and her diagnosis.  I sent a prescription in for Vistaril for her to use as needed for anxiety.  I have asked her to call me if this does not help improve her symptoms.  Patient is status post splenectomy years ago.  I have asked her to check her records to make sure that she is up-to-date on necessary vaccines, especially prior to starting upcoming chemotherapy.  We discussed the implications that her BRCA 2 mutation will have for treatment.  Specifically, we discussed the use of a PARPi for maintenance treatment after upfront systemic therapy.  The patient is scheduled to see Dr. Alvy Chandler on 10/6.  A copy of this note was sent to the patient's referring provider.   70 minutes of total time  was spent for this patient encounter, including preparation, face-to-face counseling with the patient and coordination of care, and documentation of the encounter.   Lauren Pinch, MD   Division of Gynecologic Oncology  Department of Obstetrics and Gynecology  [place] [facility]    ADDENDUM:  Office was able to move her paracentesis to later this afternoon.  Dr. Alvy Chandler also has an opening and will see her for consultation today.  Labs noted below:    Latest Ref Rng & Units 07/23/2022    9:48 AM 01/25/2012    8:04 AM 01/14/2011    3:45 PM  BMP  Glucose 70 - 99 mg/dL 101  95  111   BUN 6 - 20 mg/dL $Remove'18  13  12   'cMCOkHy$ Creatinine 0.44 - 1.00 mg/dL 1.07  0.93  0.75   Sodium 135 - 145 mmol/L 133  137  137   Potassium 3.5 - 5.1 mmol/L 4.3  4.6  3.9   Chloride 98 - 111 mmol/L 98  104  105   CO2 22 - 32 mmol/L $RemoveB'28  26  25   'BBvgtadg$ Calcium 8.9 - 10.3 mg/dL 9.0  9.3  8.8    After lab work was done, pulse was found to be 100.  Given improvement in her creatinine and potassium without significant laboratory evidence of dehydration, will hold on giving IV fluids today.  ___________________________________________  Chief Complaint: No chief complaint on file.   History of Present Illness:  SELENIA Chandler is a 56 y.o. y.o. female who is seen in consultation at the request of Dr. Gaetano Chandler for an evaluation of malignant ascites, peritoneal carcinomatosis.  Remote history of Hodgkin's disease in 1989, s/p mantle radiation (radiated sitting up, done at [facility] as part of a trial), splenectomy.  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 S/p bilateral mastectomies, chemotherapy (doxorubicin/cyclophosphamide + weekly taxol, completed 07/2006), 2.5 years of Tamoxifen (ER low positivity - 4%). 06/2018: [facility] genetic testing showed heterozygous for pathogenic BRCA2 mucation ([id number]). VUS in PMS2 (c.751G>A). 03/2019: total hysterectomy with BSO. Pathology with no hyperplasia, dysplasia, borderline tumor or malignancy.   She presented to her OB/GYN approximately 1 week ago with 3-4 weeks early satiety and fatigue.  Given abdominal distention and tenderness on exam in the  setting of her BRCA2 mutation, less urgent CT scan was ordered. CT of the abdomen and pelvis with IV contrast on 07/19/2022 showed a small left pleural effusion, extensive peritoneal carcinomatosis with associated moderate ascites.  No adnexal masses identified or evidence of bowel obstruction.  Today, the patient presents with her husband.  She describes noting bloating in the first week of September.  Since then she has developed other symptoms including abdominal discomfort and soreness, as well as pelvic pressure and increased urinary frequency.  She also endorses having episodes of gagging and some small-volume emesis if she drinks too much or too quickly.  She is having trouble sleeping due to discomfort and also endorses anxiety with everything going on.  She has some shortness of breath, has to stop and rest after she climbs a flight of steps.  She endorses regular bowel function, describes her stools as soft.  Typically has a bowel movement every day, has not had 1 in 2 days.  Endorses decreased appetite, overall drinking less fluid.  Is eating much less than typical, sticking to things such as toast and scrambled eggs.  She is one of 6 siblings, 3 have BRCA2 mutation.  Patient presents with her husband  today.  She retired recently.  PAST MEDICAL HISTORY:  Past Medical History:  Diagnosis Date   BRCA2 gene mutation positive 01/08/2019   Breast cancer ([place]) 2007   Left   Hodgkin's disease ([place]) 10/26/1987   clinical   Malignant tumor of breast ([place])    left   Thyroid dysfunction    2020, as results on Hodgkin's radiation     PAST SURGICAL HISTORY:  Past Surgical History:  Procedure Laterality Date   BREAST RECONSTRUCTION     2009   COLONOSCOPY     2017   excisional of bilateral breast     2007   Hysteroscopy w.biopsy     2012   KNEE ARTHROSCOPY     1985   laparoscopically assisted vaginal hysterectomy w/removal of tubes and/or ovaries  04/23/2019   LYMPH NODE BIOPSY  1989    MASTECTOMY Bilateral 2007   removal of spleen total     1989    OB/GYN HISTORY:  OB History  Gravida Para Term Preterm AB Living  $Remov'4 3     1 3  'mTDvFj$ SAB IAB Ectopic Multiple Live Births               # Outcome Date GA Lbr Len/2nd Weight Sex Delivery Anes PTL Lv  4 AB           3 Para           2 Para           1 Para             No LMP recorded.  Age at menarche: 17  Age at menopause: In 2007 with chemotherapy Hx of HRT: Denies Hx of STDs: denies Last pap: 06/2022 - [facility] History of abnormal pap smears: denies  SCREENING STUDIES:  Last mammogram: n/a (s/p bilateral mastectomies)  Last colonoscopy: 2017 Last bone mineral density: 2011  MEDICATIONS: Outpatient Encounter Medications as of 07/23/2022  Medication Sig   Cholecalciferol (VITAMIN D3) 25 MCG (1000 UT) CAPS 1 capsule Orally Once a day   hydrOXYzine (VISTARIL) 25 MG capsule Take 1 capsule (25 mg total) by mouth 3 (three) times daily as needed.   levothyroxine (SYNTHROID) 125 MCG tablet Take 125 mcg by mouth every morning.   nitrofurantoin, macrocrystal-monohydrate, (MACROBID) 100 MG capsule Take 100 mg by mouth every 12 (twelve) hours.   pantoprazole (PROTONIX) 40 MG tablet Take 40 mg by mouth daily.   vitamin B-12 (CYANOCOBALAMIN) 500 MCG tablet Take 500 mcg by mouth daily.   No facility-administered encounter medications on file as of 07/23/2022.    ALLERGIES:  No Known Allergies   FAMILY HISTORY:  Family History  Problem Relation Age of Onset   Prostate cancer Father    Prostate cancer Brother    Colon cancer Maternal Grandmother    Breast cancer Paternal Grandmother    Ovarian cancer Neg Hx    Endometrial cancer Neg Hx    Pancreatic cancer Neg Hx      SOCIAL HISTORY:  Social Connections: Not on file    REVIEW OF SYSTEMS:  + Decreased appetite, fatigue, 10-15 pound weight gain, shortness of breath, bloating, abdominal pain, urinary frequency, pelvic pain, back pain, anxiety, depression. Denies  fevers, chills. Denies hearing loss, neck lumps or masses, mouth sores, ringing in ears or voice changes. Denies cough or wheezing.   Denies chest pain or palpitations. Denies leg swelling. Denies blood in stools, constipation, diarrhea, nausea, vomiting, or early satiety. Denies pain with intercourse, dysuria,  hematuria or incontinence. Denies hot flashes, vaginal bleeding or vaginal discharge.   Denies joint pain or muscle pain/cramps. Denies itching, rash, or wounds. Denies dizziness, headaches, numbness or seizures. Denies swollen lymph nodes or glands, denies easy bruising or bleeding. Denies confusion or decreased concentration.  Physical Exam:  Vital Signs for this encounter:  Blood pressure 130/74, pulse (!) 120, temperature 98.4 F (36.9 C), temperature source Oral, resp. rate (!) 100, height $RemoveBef'5\' 3"'MtwoaWyPMv$  (1.6 m), weight 178 lb (80.7 kg). Body mass index is 31.53 kg/m. General: Alert, oriented, no acute distress.  HEENT: Normocephalic, atraumatic. Sclera anicteric.  Chest: Clear to auscultation bilaterally. No wheezes, rhonchi, or rales. Cardiovascular: Mildly tachycardic (100s), regular rhythm, no murmurs, rubs, or gallops.  Abdomen: Normoactive bowel sounds. Tense, moderately distended, moderately tender to palpation. No masses or hepatosplenomegaly appreciated. + palpable fluid wave.  Extremities: Grossly normal range of motion. Warm, well perfused. No edema bilaterally.  Skin: No rashes or lesions.  Lymphatics: No cervical, supraclavicular, or inguinal adenopathy.  GU:  Normal external female genitalia. No lesions. No discharge or bleeding.             Bladder/urethra:  No lesions or masses, well supported bladder             Vagina: Mildly atrophic vaginal mucosa, no lesions.             Cervix/uterus: surgically absent, cuff normal in appearance.             Adnexa: No masses appreciated but fluid appreciated in cul de sac, no nodularity.  Rectal: Deferred.  LABORATORY AND  RADIOLOGIC DATA:  Outside medical records were reviewed to synthesize the above history, along with the history and physical obtained during the visit.   Labs from 07/16/22: WBC 12.3, hemoglobin 11.8, hematocrit 35.9, platelets 726 Sodium 137, potassium 5.8, chloride 100, bicarb 20, creatinine 1.28, BUN 16, glucose 91 Total protein 8.1, albumin 3.9, bilirubin total 0.3, alk phos 75, AST 34, ALT 24.

## 2022-07-23 ENCOUNTER — Inpatient Hospital Stay (HOSPITAL_BASED_OUTPATIENT_CLINIC_OR_DEPARTMENT_OTHER): Payer: BC Managed Care – PPO | Admitting: Hematology and Oncology

## 2022-07-23 ENCOUNTER — Encounter: Payer: Self-pay | Admitting: Gynecologic Oncology

## 2022-07-23 ENCOUNTER — Encounter: Payer: Self-pay | Admitting: Hematology and Oncology

## 2022-07-23 ENCOUNTER — Inpatient Hospital Stay: Payer: BC Managed Care – PPO | Attending: Gynecologic Oncology

## 2022-07-23 ENCOUNTER — Ambulatory Visit (HOSPITAL_COMMUNITY)
Admission: RE | Admit: 2022-07-23 | Discharge: 2022-07-23 | Disposition: A | Discharge status: Home / Self Care | Payer: BC Managed Care – PPO | Source: Ambulatory Visit | Attending: Gynecologic Oncology | Admitting: Gynecologic Oncology

## 2022-07-23 ENCOUNTER — Inpatient Hospital Stay (HOSPITAL_BASED_OUTPATIENT_CLINIC_OR_DEPARTMENT_OTHER): Payer: BC Managed Care – PPO | Admitting: Gynecologic Oncology

## 2022-07-23 ENCOUNTER — Other Ambulatory Visit: Payer: Self-pay

## 2022-07-23 VITALS — BP 114/57 | HR 110 | Temp 97.7°F | Resp 18 | Ht 63.0 in | Wt 171.0 lb

## 2022-07-23 VITALS — BP 130/74 | HR 120 | Temp 98.4°F | Resp 100 | Ht 63.0 in | Wt 178.0 lb

## 2022-07-23 DIAGNOSIS — E860 Dehydration: Secondary | ICD-10-CM | POA: Diagnosis not present

## 2022-07-23 DIAGNOSIS — Z9081 Acquired absence of spleen: Secondary | ICD-10-CM

## 2022-07-23 DIAGNOSIS — R102 Pelvic and perineal pain: Secondary | ICD-10-CM | POA: Insufficient documentation

## 2022-07-23 DIAGNOSIS — R000 Tachycardia, unspecified: Secondary | ICD-10-CM | POA: Insufficient documentation

## 2022-07-23 DIAGNOSIS — Z8572 Personal history of non-Hodgkin lymphomas: Secondary | ICD-10-CM | POA: Diagnosis not present

## 2022-07-23 DIAGNOSIS — C482 Malignant neoplasm of peritoneum, unspecified: Secondary | ICD-10-CM | POA: Diagnosis not present

## 2022-07-23 DIAGNOSIS — R109 Unspecified abdominal pain: Secondary | ICD-10-CM | POA: Diagnosis not present

## 2022-07-23 DIAGNOSIS — R350 Frequency of micturition: Secondary | ICD-10-CM | POA: Diagnosis not present

## 2022-07-23 DIAGNOSIS — Z1501 Genetic susceptibility to malignant neoplasm of breast: Secondary | ICD-10-CM

## 2022-07-23 DIAGNOSIS — Z853 Personal history of malignant neoplasm of breast: Secondary | ICD-10-CM

## 2022-07-23 DIAGNOSIS — Z148 Genetic carrier of other disease: Secondary | ICD-10-CM | POA: Diagnosis not present

## 2022-07-23 DIAGNOSIS — C800 Disseminated malignant neoplasm, unspecified: Secondary | ICD-10-CM | POA: Insufficient documentation

## 2022-07-23 DIAGNOSIS — Z9221 Personal history of antineoplastic chemotherapy: Secondary | ICD-10-CM | POA: Insufficient documentation

## 2022-07-23 DIAGNOSIS — K5909 Other constipation: Secondary | ICD-10-CM | POA: Insufficient documentation

## 2022-07-23 DIAGNOSIS — Z90710 Acquired absence of both cervix and uterus: Secondary | ICD-10-CM | POA: Diagnosis not present

## 2022-07-23 DIAGNOSIS — R6881 Early satiety: Secondary | ICD-10-CM | POA: Insufficient documentation

## 2022-07-23 DIAGNOSIS — R180 Malignant ascites: Secondary | ICD-10-CM | POA: Insufficient documentation

## 2022-07-23 DIAGNOSIS — R1110 Vomiting, unspecified: Secondary | ICD-10-CM | POA: Insufficient documentation

## 2022-07-23 DIAGNOSIS — M549 Dorsalgia, unspecified: Secondary | ICD-10-CM | POA: Diagnosis not present

## 2022-07-23 DIAGNOSIS — Z1509 Genetic susceptibility to other malignant neoplasm: Secondary | ICD-10-CM | POA: Insufficient documentation

## 2022-07-23 DIAGNOSIS — R0602 Shortness of breath: Secondary | ICD-10-CM | POA: Insufficient documentation

## 2022-07-23 DIAGNOSIS — R630 Anorexia: Secondary | ICD-10-CM | POA: Diagnosis not present

## 2022-07-23 DIAGNOSIS — F419 Anxiety disorder, unspecified: Secondary | ICD-10-CM | POA: Diagnosis not present

## 2022-07-23 DIAGNOSIS — J90 Pleural effusion, not elsewhere classified: Secondary | ICD-10-CM | POA: Insufficient documentation

## 2022-07-23 DIAGNOSIS — Z90722 Acquired absence of ovaries, bilateral: Secondary | ICD-10-CM | POA: Insufficient documentation

## 2022-07-23 DIAGNOSIS — Z9013 Acquired absence of bilateral breasts and nipples: Secondary | ICD-10-CM | POA: Insufficient documentation

## 2022-07-23 DIAGNOSIS — C786 Secondary malignant neoplasm of retroperitoneum and peritoneum: Secondary | ICD-10-CM

## 2022-07-23 DIAGNOSIS — R635 Abnormal weight gain: Secondary | ICD-10-CM | POA: Diagnosis not present

## 2022-07-23 DIAGNOSIS — D75839 Thrombocytosis, unspecified: Secondary | ICD-10-CM | POA: Insufficient documentation

## 2022-07-23 DIAGNOSIS — C801 Malignant (primary) neoplasm, unspecified: Secondary | ICD-10-CM | POA: Insufficient documentation

## 2022-07-23 DIAGNOSIS — Z8571 Personal history of Hodgkin lymphoma: Secondary | ICD-10-CM | POA: Diagnosis not present

## 2022-07-23 DIAGNOSIS — C481 Malignant neoplasm of specified parts of peritoneum: Secondary | ICD-10-CM

## 2022-07-23 DIAGNOSIS — Z1502 Genetic susceptibility to malignant neoplasm of ovary: Secondary | ICD-10-CM | POA: Insufficient documentation

## 2022-07-23 DIAGNOSIS — Z79899 Other long term (current) drug therapy: Secondary | ICD-10-CM | POA: Insufficient documentation

## 2022-07-23 DIAGNOSIS — E079 Disorder of thyroid, unspecified: Secondary | ICD-10-CM | POA: Insufficient documentation

## 2022-07-23 HISTORY — PX: IR PARACENTESIS: IMG2679

## 2022-07-23 LAB — BASIC METABOLIC PANEL - CANCER CENTER ONLY
Anion gap: 7 (ref 5–15)
BUN: 18 mg/dL (ref 6–20)
CO2: 28 mmol/L (ref 22–32)
Calcium: 9 mg/dL (ref 8.9–10.3)
Chloride: 98 mmol/L (ref 98–111)
Creatinine: 1.07 mg/dL — ABNORMAL HIGH (ref 0.44–1.00)
GFR, Estimated: >60 mL/min (ref >60)
Glucose, Bld: 101 mg/dL — ABNORMAL HIGH (ref 70–99)
Potassium: 4.3 mmol/L (ref 3.5–5.1)
Sodium: 133 mmol/L — ABNORMAL LOW (ref 135–145)

## 2022-07-23 LAB — CEA (ACCESS): CEA (CHCC): <1 ng/mL (ref 0.00–5.00)

## 2022-07-23 MED ORDER — HYDROXYZINE PAMOATE 25 MG PO CAPS: 25.0000 mg | ORAL_CAPSULE | Freq: Three times a day (TID) | ORAL | 2 refills | Status: DC | PRN

## 2022-07-23 MED ORDER — LIDOCAINE-PRILOCAINE 2.5-2.5 % EX CREA: TOPICAL_CREAM | CUTANEOUS | 3 refills | Status: DC

## 2022-07-23 MED ORDER — LIDOCAINE HCL 1 % IJ SOLN
INTRAMUSCULAR | Status: AC
  Filled 2022-07-23: qty 20

## 2022-07-23 MED ORDER — PROCHLORPERAZINE MALEATE 10 MG PO TABS: 10.0000 mg | ORAL_TABLET | Freq: Four times a day (QID) | ORAL | 1 refills | Status: DC | PRN

## 2022-07-23 MED ORDER — LIDOCAINE HCL 1 % IJ SOLN
INTRAMUSCULAR | Status: DC | PRN
  Administered 2022-07-23: 5 mL via INTRADERMAL

## 2022-07-23 MED ORDER — ONDANSETRON HCL 8 MG PO TABS: 8.0000 mg | ORAL_TABLET | Freq: Three times a day (TID) | ORAL | 1 refills | Status: DC | PRN

## 2022-07-23 MED ORDER — DEXAMETHASONE 4 MG PO TABS: ORAL_TABLET | ORAL | 6 refills | Status: DC

## 2022-07-23 NOTE — Assessment & Plan Note (Signed)
She would benefit from PARP inhibitor after completion of treatment

## 2022-07-23 NOTE — Assessment & Plan Note (Signed)
This is the cause of her thrombocytosis Observe only 

## 2022-07-23 NOTE — Assessment & Plan Note (Signed)
The patient is profoundly symptomatic She understood why upfront surgery is not recommended We discussed neoadjuvant chemotherapy approach  We reviewed the NCCN guidelines We discussed the role of chemotherapy. The intent is of curative intent.  We discussed some of the risks, benefits, side-effects of carboplatin & Taxol. Treatment is intravenous, every 3 weeks x 6 cycles  Some of the short term side-effects included, though not limited to, including weight loss, life threatening infections, risk of allergic reactions, need for transfusions of blood products, nausea, vomiting, change in bowel habits, loss of hair, admission to hospital for various reasons, and risks of death.   Long term side-effects are also discussed including risks of infertility, permanent damage to nerve function, hearing loss, chronic fatigue, kidney damage with possibility needing hemodialysis, and rare secondary malignancy including bone marrow disorders.  The patient is aware that the response rates discussed earlier is not guaranteed.  After a long discussion, patient made an informed decision to proceed with the prescribed plan of care.   Patient education material was dispensed. We discussed premedication with dexamethasone before chemotherapy. I recommend port placement I plan to start her on treatment within the next 10 days I plan to see her weekly for supportive care

## 2022-07-23 NOTE — Procedures (Signed)
PROCEDURE SUMMARY:  Successful image-guided paracentesis from the right lower abdomen.  Yielded 4.2 liters of yellow fluid.  No immediate complications.  EBL = trace. Patient tolerated well.   Specimen was sent for labs.  Please see imaging section of Epic for full dictation.   Lura Em PA-C 07/23/2022 1:55 PM

## 2022-07-23 NOTE — Progress Notes (Signed)
[place] CONSULT NOTE  Patient Care Team: Michael Boston, MD as PCP - General (Internal Medicine) Juanita Craver, MD as Consulting Physician (Gastroenterology)  ASSESSMENT & PLAN:  Primary peritoneal adenocarcinoma [facility]) The patient is profoundly symptomatic She understood why upfront surgery is not recommended We discussed neoadjuvant chemotherapy approach  We reviewed the NCCN guidelines We discussed the role of chemotherapy. The intent is of curative intent.  We discussed some of the risks, benefits, side-effects of carboplatin & Taxol. Treatment is intravenous, every 3 weeks x 6 cycles  Some of the short term side-effects included, though not limited to, including weight loss, life threatening infections, risk of allergic reactions, need for transfusions of blood products, nausea, vomiting, change in bowel habits, loss of hair, admission to hospital for various reasons, and risks of death.   Long term side-effects are also discussed including risks of infertility, permanent damage to nerve function, hearing loss, chronic fatigue, kidney damage with possibility needing hemodialysis, and rare secondary malignancy including bone marrow disorders.  The patient is aware that the response rates discussed earlier is not guaranteed.  After a long discussion, patient made an informed decision to proceed with the prescribed plan of care.   Patient education material was dispensed. We discussed premedication with dexamethasone before chemotherapy. 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    Answer:   No    Order Specific Question:   Preferred Imaging Location?    Answer:   [facility]   CBC with Differential ([place] Only)    Standing Status:   Future    Standing Expiration Date:   08/03/2023   CMP ([place] only)    Standing Status:   Future    Standing Expiration Date:   08/03/2023   CBC with Differential ([place] Only)    Standing Status:   Future    Standing Expiration Date:   08/24/2023   CMP ([place] only)    Standing Status:   Future    Standing Expiration Date:   08/24/2023   CBC with Differential ([place] Only)    Standing Status:   Future    Standing Expiration Date:   09/14/2023   CMP ([place] only)    Standing Status:   Future    Standing Expiration Date:   09/14/2023   CBC with Differential ([place] Only)  Standing Status:   Future    Standing Expiration Date:   10/05/2023   CMP ([place] only)    Standing Status:   Future    Standing Expiration Date:   10/05/2023   CBC with Differential ([place] Only)    Standing Status:   Future    Standing Expiration Date:   10/26/2023   CMP ([place] only)    Standing Status:   Future    Standing Expiration Date:   10/26/2023   CBC with Differential ([facility] Only)    Standing Status:   Future    Standing  Expiration Date:   11/16/2023   CMP ([place] only)    Standing Status:   Future    Standing Expiration Date:   11/16/2023    The total time spent in the appointment was 80 minutes encounter with patients including review of chart and various tests results, discussions about plan of care and coordination of care plan   All questions were answered. The patient knows to call the clinic with any problems, questions or concerns. No barriers to learning was detected.  Lauren Lark, MD 9/29/20235:19 PM  CHIEF COMPLAINTS/PURPOSE OF CONSULTATION:  Diffuse abdominal carcinomatosis, remote history of breast cancer Hodgkin lymphoma, BRCA2 positive, suspect primary peritoneal carcinoma  HISTORY OF PRESENTING ILLNESS:  Lauren Chandler 56 y.o. female is here because of recent diagnosis of cancer She is here accompanied by her husband The patient is retired She was last seen at the cancer center in 2014 She had remote history of Hodgkin lymphoma complicated by development of breast cancer likely secondary to radiation treatment.  She was found to have BRCA2 positive disease.  She was treated with dose dense doxorubicin/cyclophosphamide followed by weekly paclitaxel, treatment completed by October 2007.  She was given tamoxifen briefly and discontinued in 2010.  The patient had bilateral salpingo-oophorectomy Most recently, she did complain of abdominal distention and bloating.  She had investigation at her gynecologist and was subsequently referred here for evaluation Blood work reviewed borderline elevated creatinine of 1.28, high potassium but with normal hemoglobin CT imaging showed diffuse carcinomatosis with ascites She underwent therapeutic paracentesis today Overall, her clinical impression is most compatible with primary peritoneal carcinoma She is here to discuss neoadjuvant chemotherapy approach She denies abnormal vaginal bleeding She felt much better after paracentesis She had changes in  her bowel habits with predominant constipation with occasional frequent small bowel movement She complains of fatigue She denies residual neuropathy from prior chemotherapy  I have reviewed her chart and materials related to her cancer extensively and collaborated history with the patient. Summary of oncologic history is as follows: Oncology History  Primary peritoneal adenocarcinoma ([place])  07/19/2022 Imaging   1. Small left pleural effusion  2. 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[place])   07/23/2022 Cancer Staging   Staging form: Ovary, Fallopian Tube, and Primary Peritoneal Carcinoma, AJCC 8th Edition - Clinical stage from 07/23/2022: FIGO Stage IIIC (cT3c, cN0, cM0) - Signed by Lauren Lark, MD on 07/23/2022 Stage prefix: Initial diagnosis   08/02/2022 -  Chemotherapy   Patient is on Treatment Plan : OVARIAN Carboplatin (AUC 6) + Paclitaxel (175) q21d X 6 Cycles       MEDICAL HISTORY:  Past Medical History:  Diagnosis Date   BRCA2 gene mutation positive 01/08/2019   Breast cancer ([place]) 2007   Left   Hodgkin's disease ([place]) 10/26/1987   clinical  Malignant tumor of breast ([place])    left   Thyroid dysfunction    2020, as results on Hodgkin's radiation    SURGICAL HISTORY: Past Surgical History:  Procedure Laterality Date   BREAST RECONSTRUCTION     2009   COLONOSCOPY     2017   excisional of bilateral breast     2007   Hysteroscopy w.biopsy     2012   IR PARACENTESIS  07/23/2022   KNEE ARTHROSCOPY     1985   laparoscopically assisted vaginal hysterectomy w/removal of tubes and/or ovaries  04/23/2019   LYMPH NODE BIOPSY  1989   MASTECTOMY Bilateral 2007   removal of spleen total     1989    SOCIAL HISTORY: Social History   Socioeconomic History   Marital status: Married    Spouse name: Not on file   Number of children: Not on file   Years of education: Not on file    Highest education level: Not on file  Occupational History   Occupation: retired  Tobacco Use   Smoking status: Never   Smokeless tobacco: Never  Substance and Sexual Activity   Alcohol use: Yes    Comment: rare   Drug use: Never   Sexual activity: Yes  Other Topics Concern   Not on file  Social History Narrative   Not on file   Social Determinants of Health   Financial Resource Strain: Not on file  Food Insecurity: Not on file  Transportation Needs: Not on file  Physical Activity: Not on file  Stress: Not on file  Social Connections: Not on file  Intimate Partner Violence: Not on file    FAMILY HISTORY: Family History  Problem Relation Age of Onset   Prostate cancer Father    Prostate cancer Brother    Colon cancer Maternal Grandmother    Breast cancer Paternal Grandmother    Ovarian cancer Neg Hx    Endometrial cancer Neg Hx    Pancreatic cancer Neg Hx     ALLERGIES:  has No Known Allergies.  MEDICATIONS:  Current Outpatient Medications  Medication Sig Dispense Refill   dexamethasone (DECADRON) 4 MG tablet Take 2 tabs at the night before and 2 tab the morning of chemotherapy, every 3 weeks, by mouth x 6 cycles 36 tablet 6   Cholecalciferol (VITAMIN D3) 25 MCG (1000 UT) CAPS 1 capsule Orally Once a day     hydrOXYzine (VISTARIL) 25 MG capsule Take 1 capsule (25 mg total) by mouth 3 (three) times daily as needed. 30 capsule 2   levothyroxine (SYNTHROID) 125 MCG tablet Take 125 mcg by mouth every morning.     lidocaine-prilocaine (EMLA) cream Apply to affected area once 30 g 3   nitrofurantoin, macrocrystal-monohydrate, (MACROBID) 100 MG capsule Take 100 mg by mouth every 12 (twelve) hours.     ondansetron (ZOFRAN) 8 MG tablet Take 1 tablet (8 mg total) by mouth every 8 (eight) hours as needed for nausea or vomiting. Start on the third day after chemotherapy. 30 tablet 1   pantoprazole (PROTONIX) 40 MG tablet Take 40 mg by mouth daily.     prochlorperazine  (COMPAZINE) 10 MG tablet Take 1 tablet (10 mg total) by mouth every 6 (six) hours as needed for nausea or vomiting. 30 tablet 1   vitamin B-12 (CYANOCOBALAMIN) 500 MCG tablet Take 500 mcg by mouth daily.     No current facility-administered medications for this visit.   Facility-Administered Medications Ordered in Other Visits  Medication  Dose Route Frequency Provider Last Rate Last Admin   lidocaine (XYLOCAINE) 1 % (with pres) injection    PRN Lura Em, PA   5 mL at 07/23/22 1321    REVIEW OF SYSTEMS:   Constitutional: Denies fevers, chills or abnormal night sweats Eyes: Denies blurriness of vision, double vision or watery eyes Ears, nose, mouth, throat, and face: Denies mucositis or sore throat Respiratory: Denies cough, dyspnea or wheezes Cardiovascular: Denies palpitation, chest discomfort or lower extremity swelling Skin: Denies abnormal skin rashes Lymphatics: Denies new lymphadenopathy or easy bruising Neurological:Denies numbness, tingling or new weaknesses Behavioral/Psych: Mood is stable, no new changes  All other systems were reviewed with the patient and are negative.  PHYSICAL EXAMINATION: ECOG PERFORMANCE STATUS: 1 - Symptomatic but completely ambulatory  Vitals:   07/23/22 1437  BP: (!) 114/57  Pulse: (!) 110  Resp: 18  Temp: 97.7 F (36.5 C)  SpO2: 96%   Filed Weights   07/23/22 1437  Weight: 171 lb (77.6 kg)    GENERAL:alert, no distress and comfortable SKIN: skin color, texture, turgor are normal, no rashes or significant lesions EYES: normal, conjunctiva are pink and non-injected, sclera clear OROPHARYNX:no exudate, no erythema and lips, buccal mucosa, and tongue normal  NECK: supple, thyroid normal size, non-tender, without nodularity LYMPH:  no palpable lymphadenopathy in the cervical, axillary or inguinal LUNGS: clear to auscultation and percussion with normal breathing effort HEART: regular rate & rhythm and no murmurs and no lower  extremity edema ABDOMEN:abdomen is grossly distended with probable carcinomatosis on the abdominal wall Musculoskeletal:no cyanosis of digits and no clubbing  PSYCH: alert & oriented x 3 with fluent speech NEURO: no focal motor/sensory deficits  LABORATORY DATA:  I have reviewed the data as listed Lab Results  Component Value Date   WBC 9.4 01/25/2012   HGB 12.9 01/25/2012   HCT 38.4 01/25/2012   MCV 90.8 01/25/2012   PLT 365 01/25/2012   Recent Labs    07/23/22 0948  NA 133*  K 4.3  CL 98  CO2 28  GLUCOSE 101*  BUN 18  CREATININE 1.07*  CALCIUM 9.0  GFRNONAA >60    RADIOGRAPHIC STUDIES: I have personally reviewed the radiological images as listed and agreed with the findings in the report. IR Paracentesis  Result Date: 07/23/2022 INDICATION: Malignant ascites EXAM: ULTRASOUND GUIDED diagnostic and therapeutic PARACENTESIS MEDICATIONS: 7 cc 1% lidocaine COMPLICATIONS: None immediate. PROCEDURE: Informed written consent was obtained from the patient after a discussion of the risks, benefits and alternatives to treatment. A timeout was performed prior to the initiation of the procedure. Initial ultrasound scanning demonstrates a large amount of ascites within the right lower abdominal quadrant. The right lower abdomen was prepped and draped in the usual sterile fashion. 1% lidocaine was used for local anesthesia. Following this, a 19 gauge, 7-cm, Yueh catheter was introduced. An ultrasound image was saved for documentation purposes. The paracentesis was performed. The catheter was removed and a dressing was applied. The patient tolerated the procedure well without immediate post procedural complication. FINDINGS: A total of approximately 4.2 L of yellow fluid was removed. Samples were sent to the laboratory as requested by the clinical team. IMPRESSION: Successful ultrasound-guided paracentesis yielding 4.2 liters of peritoneal fluid. Read by: Reatha Armour, PA-C Electronically Signed    By: Corrie Mckusick D.O.   On: 07/23/2022 16:50

## 2022-07-23 NOTE — Assessment & Plan Note (Signed)
She has no evidence of recurrent disease She does not need long-term follow-up in this regard

## 2022-07-23 NOTE — Assessment & Plan Note (Signed)
She has therapeutic relief after therapeutic paracentesis Observe closely

## 2022-07-23 NOTE — Patient Instructions (Addendum)
It was very nice to meet you today.  We have you scheduled for a paracentesis on 10/2, which is a procedure where they will remove some of the fluid from your abdominal cavity.  This will be sent to help US obtain a diagnosis.  If we do not get a diagnosis from this fluid, you are tentatively scheduled for a biopsy of one of the cancer deposits on 10/9.  You will see Dr. Alvy Bimler, her medical oncologist, on 10/6.  At this visit, you will discuss results from your paracentesis and plan for chemotherapy.  I sent a prescription in for Vistaril, which you can use 3 times a day as needed for anxiety.  This medication can also help sometimes with nausea.

## 2022-07-23 NOTE — Progress Notes (Signed)
START ON PATHWAY REGIMEN - Ovarian     A cycle is every 21 days:     Paclitaxel      Carboplatin   **Always confirm dose/schedule in your pharmacy ordering system**  Patient Characteristics: Preoperative or Nonsurgical Candidate (Clinical Staging), Newly Diagnosed, Neoadjuvant Therapy followed by Surgery BRCA Mutation Status: Present ([place]) Therapeutic Status: Preoperative or Nonsurgical Candidate (Clinical Staging) AJCC T Category: cT3 AJCC 8 Stage Grouping: Unknown AJCC N Category: cNX AJCC M Category: cM0 Therapy Plan: Neoadjuvant Therapy followed by Surgery Intent of Therapy: Curative Intent, Discussed with Patient

## 2022-07-23 NOTE — Assessment & Plan Note (Signed)
She has no evidence of disease I do not believe the carcinomatosis is due to breast cancer She does not need further follow-up in this regard

## 2022-07-23 NOTE — Assessment & Plan Note (Signed)
I recommend regular laxative therapy. 

## 2022-07-23 NOTE — Progress Notes (Unsigned)
Arne Cleveland, MD  Tamala Bari for   [place] para for cytology   (Can set up CT omental if inadequate cyto)   D/w Dr Berline Lopes   [facility]

## 2022-07-24 ENCOUNTER — Other Ambulatory Visit: Payer: Self-pay

## 2022-07-24 LAB — CA 125: Cancer Antigen (CA) 125: 490 U/mL — ABNORMAL HIGH (ref 0.0–38.1)

## 2022-07-25 ENCOUNTER — Other Ambulatory Visit: Payer: Self-pay

## 2022-07-26 ENCOUNTER — Other Ambulatory Visit: Payer: Self-pay

## 2022-07-26 ENCOUNTER — Telehealth: Payer: Self-pay

## 2022-07-26 ENCOUNTER — Ambulatory Visit (HOSPITAL_COMMUNITY): Payer: BC Managed Care – PPO

## 2022-07-26 NOTE — Progress Notes (Signed)
Pharmacist Chemotherapy Monitoring - Initial Assessment    Anticipated start date: 08/02/22    The following has been reviewed per standard work regarding the patient's treatment regimen: The patient's diagnosis, treatment plan and drug doses, and organ/hematologic function Lab orders and baseline tests specific to treatment regimen  The treatment plan start date, drug sequencing, and pre-medications Prior authorization status  Patient's documented medication list, including drug-drug interaction screen and prescriptions for anti-emetics and supportive care specific to the treatment regimen The drug concentrations, fluid compatibility, administration routes, and timing of the medications to be used The patient's access for treatment and lifetime cumulative dose history, if applicable  The patient's medication allergies and previous infusion related reactions, if applicable   Changes made to treatment plan:  pre-medications : Change Benadryl to Quzyttir   Follow up needed:  F/u cmet   Acquanetta Belling, RPH, BCPS, BCOP 07/26/2022  3:46 PM

## 2022-07-26 NOTE — Telephone Encounter (Signed)
Called and given port appt at [facility] 10/5 arrive in admitting at [place], need driver, may take am meds with sips of water and 24 hr supervision post port placement. She verbalized understanding.

## 2022-07-27 ENCOUNTER — Encounter: Payer: Self-pay | Admitting: Hematology and Oncology

## 2022-07-27 ENCOUNTER — Encounter: Payer: Self-pay | Admitting: Internal Medicine

## 2022-07-27 ENCOUNTER — Ambulatory Visit: Payer: BC Managed Care – PPO | Attending: Internal Medicine | Admitting: Internal Medicine

## 2022-07-27 VITALS — BP 126/70 | HR 117 | Ht 63.0 in | Wt 175.2 lb

## 2022-07-27 DIAGNOSIS — R0600 Dyspnea, unspecified: Secondary | ICD-10-CM | POA: Diagnosis not present

## 2022-07-27 DIAGNOSIS — E785 Hyperlipidemia, unspecified: Secondary | ICD-10-CM | POA: Diagnosis not present

## 2022-07-27 DIAGNOSIS — I2510 Atherosclerotic heart disease of native coronary artery without angina pectoris: Secondary | ICD-10-CM | POA: Diagnosis not present

## 2022-07-27 DIAGNOSIS — C482 Malignant neoplasm of peritoneum, unspecified: Secondary | ICD-10-CM | POA: Diagnosis not present

## 2022-07-27 DIAGNOSIS — I700 Atherosclerosis of aorta: Secondary | ICD-10-CM | POA: Diagnosis not present

## 2022-07-27 MED ORDER — ASPIRIN 81 MG PO TBEC: 81.0000 mg | DELAYED_RELEASE_TABLET | Freq: Every day | ORAL | 3 refills | Status: DC

## 2022-07-27 NOTE — Addendum Note (Signed)
Addended by: Rodman Key on: 07/27/2022 02:38 PM   Modules accepted: Orders

## 2022-07-27 NOTE — Progress Notes (Signed)
Cardiology Office Note:    Date:  07/27/2022   ID:  Lauren Chandler, DOB May 04, 1966, MRN [id number]  PCP:  Lauren Boston, MD   [place] Providers Cardiologist:  Lauren Sciara, MD Referring MD: Lauren Boston, MD   Chief Complaint/Reason for Referral: Elevated calcium score  ASSESSMENT:    1. Coronary artery calcification seen on CAT scan   2. Aortic atherosclerosis (Lauren Chandler)   3. Hyperlipidemia LDL goal <70   4. Primary peritoneal adenocarcinoma ([facility])   5. Dyspnea, unspecified type     PLAN:    In order of problems listed above: 1.  Coronary artery calcification: We will obtain an echocardiogram.  We will start aspirin 81 mg daily.  Her blood pressure is well controlled.  She is intolerant of multiple statins. 2.  Aortic atherosclerosis: See #1 above. 3.  Hyperlipidemia: The patient has tried atorvastatin and rosuvastatin and is intolerant due to myalgias.  I will refer the patient to pharmacy for further recommendations.  We will check a lipid panel and LP(a) today.  Her LDL checked at her doctor's office several months ago was above 200. 4.  Primary peritoneal adenocarcinoma: She is undergoing chemotherapy.  We will obtain a baseline echocardiogram as detailed above.              Dispo:  Return in about 6 months (around 01/26/2023).      Medication Adjustments/Labs and Tests Ordered: Current medicines are reviewed at length with the patient today.  Concerns regarding medicines are outlined above.  The following changes have been made:     Labs/tests ordered: Orders Placed This Encounter  Procedures   Lipoprotein A (LPA)   Lipid panel   AMB Referral to [facility]   EKG 12-Lead   ECHOCARDIOGRAM COMPLETE    Medication Changes: Meds ordered this encounter  Medications   aspirin EC 81 MG tablet    Sig: Take 1 tablet (81 mg total) by mouth daily. Swallow whole.    Dispense:  90 tablet    Refill:  3     Current medicines are reviewed at length with  the patient today.  The patient does not have concerns regarding medicines.   History of Present Illness:    FOCUSED PROBLEM LIST:   1.  Coronary artery calcification and aortic atherosclerosis CT scan 2023 2.  Hyperlipidemia; intolerant of atorvastatin and rosuvastatin 3.  Hodgkin's disease status post XRT 4.  Right breast cancer status post mastectomy and chemotherapy 5.  GERD 6.  BMI of 30 7.  Hypothyroidism 8.  Primary peritoneal adenocarcinoma diagnosed 2023; being treated with carboplatin and Taxol  The patient is a 56 y.o. female with the indicated medical history here for recommendations regarding elevated calcium score.  Patient had a screening calcium score done in August elevated calcium aortic atherosclerosis.  She was started on rosuvastatin 10 mg.  She is here for further recommendations.  On review of her record it seems that she was diagnosed with peritoneal adenocarcinoma started on chemotherapy recently.  She developed ascites and underwent paracentesis recently.  This is helped her orthopnea and dyspnea somewhat but she is still complaining of this though it very low level.  She denies any exertional angina, presyncope or syncope.  She has had no bleeding or bruising, unilateral lower extremity edema, black or bloody stools, or signs or symptoms of stroke.  She does not smoke.  She is recently retired from being a Pharmacist, hospital.        Current  Medications: Current Meds  Medication Sig   acetaminophen (TYLENOL) 500 MG tablet Take 1,000 mg by mouth every 6 (six) hours as needed for moderate pain.   aspirin EC 81 MG tablet Take 1 tablet (81 mg total) by mouth daily. Swallow whole.   Cholecalciferol (DIALYVITE VITAMIN D 5000) 125 MCG (5000 UT) capsule Take 5,000 Units by mouth daily.   dexamethasone (DECADRON) 4 MG tablet Take 2 tabs at the night before and 2 tab the morning of chemotherapy, every 3 weeks, by mouth x 6 cycles   hydrOXYzine (VISTARIL) 25 MG capsule Take 1  capsule (25 mg total) by mouth 3 (three) times daily as needed.   ibuprofen (ADVIL) 200 MG tablet Take 400 mg by mouth every 6 (six) hours as needed for moderate pain.   levothyroxine (SYNTHROID) 125 MCG tablet Take 125 mcg by mouth every morning.   lidocaine-prilocaine (EMLA) cream Apply to affected area once   ondansetron (ZOFRAN) 8 MG tablet Take 1 tablet (8 mg total) by mouth every 8 (eight) hours as needed for nausea or vomiting. Start on the third day after chemotherapy.   pantoprazole (PROTONIX) 40 MG tablet Take 40 mg by mouth daily.   prochlorperazine (COMPAZINE) 10 MG tablet Take 1 tablet (10 mg total) by mouth every 6 (six) hours as needed for nausea or vomiting.   vitamin B-12 (CYANOCOBALAMIN) 500 MCG tablet Take 500 mcg by mouth daily.     Allergies:    Patient has no known allergies.   Social History:   Social History   Tobacco Use   Smoking status: Never   Smokeless tobacco: Never  Substance Use Topics   Alcohol use: Yes    Comment: rare   Drug use: Never     Family Hx: Family History  Problem Relation Age of Onset   Prostate cancer Father    Prostate cancer Brother    Colon cancer Maternal Grandmother    Breast cancer Paternal Grandmother    Ovarian cancer Neg Hx    Endometrial cancer Neg Hx    Pancreatic cancer Neg Hx      Review of Systems:   Please see the history of present illness.    All other systems reviewed and are negative.     EKGs/Labs/Other Test Reviewed:    EKG:  EKG performed today that I personally reviewed demonstrates sinus tachycardia  Prior CV studies:  Calcium score 2023  Left Main: 8.0 LAD: 0 LCx: 40.2 RCA: 1.95 Total Agatston score: 50.2 [place] database percentile: 90 Aortic atherosclerosis. No acute extra cardiac abnormality.  Other studies Reviewed: Review of the additional studies/records demonstrates: CT abdomen pelvis  Recent Labs: 07/23/2022: BUN 18; Creatinine 1.07; Potassium 4.3; Sodium 133   Recent Lipid  Panel No results found for: "CHOL", "TRIG", "HDL", "LDLCALC", "LDLDIRECT"  External labs from February 2023 demonstrated creatinine 0.9, normal LFTs, hemoglobin 13.4, total cholesterol 294, triglycerides 180, HDL 49, LDL obtained  Risk Assessment/Calculations:                Physical Exam:    VS:  BP 126/70   Pulse (!) 117   Ht '5\' 3"'$  (1.6 m)   Wt 175 lb 3.2 oz (79.5 kg)   SpO2 98%   BMI 31.04 kg/m    Wt Readings from Last 3 Encounters:  07/27/22 175 lb 3.2 oz (79.5 kg)  07/23/22 171 lb (77.6 kg)  07/23/22 178 lb (80.7 kg)    GENERAL:  No apparent distress, AOx3 HEENT:  No carotid  bruits, +2 carotid impulses, no scleral icterus CAR: Regular but tachycardic no murmurs, gallops, rubs, or thrills RES:  Clear to auscultation bilaterally ABD: Firm, mildly distended, positive bowel sounds x 4 VASC:  +2 radial pulses, +2 carotid pulses, palpable pedal pulses NEURO:  CN 2-12 grossly intact; motor and sensory grossly intact PSYCH:  No active depression or anxiety EXT:  No edema, ecchymosis, or cyanosis  Signed, Early Osmond, MD  07/27/2022 2:17 PM    [facility] [address], [place], [place]  [address] Phone: [phone]; Fax: [phone]   Note:  This document was prepared using Dragon voice recognition software and may include unintentional dictation errors.

## 2022-07-27 NOTE — Patient Instructions (Addendum)
Medication Instructions:  Your physician has recommended you make the following change in your medication:  1.) start aspirin 81 mg - one tablet daily  *If you need a refill on your cardiac medications before your next appointment, please call your pharmacy*   Lab Work: Today: lipids, Lp(a)---pt will have drawn at [facility] 07/30/22  If you have labs (blood work) drawn today and your tests are completely normal, you will receive your results only by: [place] (if you have MyChart) OR A paper copy in the mail If you have any lab test that is abnormal or we need to change your treatment, we will call you to review the results.   Testing/Procedures: Your physician has requested that you have an echocardiogram. Echocardiography is a painless test that uses sound waves to create images of your heart. It provides your doctor with information about the size and shape of your heart and how well your heart's chambers and valves are working. This procedure takes approximately one hour. There are no restrictions for this procedure.   Follow-Up: At [facility], you and your health needs are our priority.  As part of our continuing mission to provide you with exceptional heart care, we have created designated Provider Care Teams.  These Care Teams include your primary Cardiologist (physician) and Advanced Practice Providers (APPs -  Physician Assistants and Nurse Practitioners) who all work together to provide you with the care you need, when you need it.  We recommend signing up for the patient portal called "MyChart".  Sign up information is provided on this After Visit Summary.  MyChart is used to connect with patients for Virtual Visits (Telemedicine).  Patients are able to view lab/test results, encounter notes, upcoming appointments, etc.  Non-urgent messages can be sent to your provider as well.   To learn more about what you can do with MyChart, go to [email or web address].     Your next appointment:   6 month(s)  The format for your next appointment:   In Person  Provider:   Early Osmond, MD   or Advanced Practice Provider (NP or PA)  You have been referred to Clinical Pharmacist for lipids due to statin intolerance.  Important Information About Sugar

## 2022-07-28 ENCOUNTER — Other Ambulatory Visit: Payer: Self-pay

## 2022-07-28 ENCOUNTER — Telehealth: Payer: Self-pay | Admitting: Gynecologic Oncology

## 2022-07-28 ENCOUNTER — Other Ambulatory Visit: Payer: Self-pay | Admitting: Radiology

## 2022-07-28 ENCOUNTER — Telehealth: Payer: Self-pay

## 2022-07-28 LAB — CYTOLOGY - NON PAP

## 2022-07-28 NOTE — Telephone Encounter (Signed)
Called the patient.  Discussed paracentesis and cytology results.  This strongly favors GYN malignancy.  I do not think that an omental or peritoneal biopsy is necessary.  I will ask the clinic to cancel her upcoming procedure.  Patient gets her port in tomorrow and is scheduled for her first goal of chemotherapy on Monday.  All questions answered.  Jeral Pinch MD Gynecologic Oncology

## 2022-07-28 NOTE — Telephone Encounter (Signed)
Please let the patient know we will call when out of the OR. The results only came back in the last couple of hours.

## 2022-07-28 NOTE — Progress Notes (Signed)
The pharmacy team has substituted IV diphenhydramine for IV cetirizine as a premedication. Patient will be monitored for hypersensitivity reaction and adverse reactions to IV cetirizine. Thanks.   Kennith Center, Pharm.D., CPP 07/28/2022'@4'$ :59 PM

## 2022-07-28 NOTE — Telephone Encounter (Signed)
Returned call to the patient. Advised the cytology from the paracentesis returned earlier today. Advised Dr. Berline Lopes will be reaching out later today to discuss in detail since she is currently in surgery. Advised the cytology returned with malignant cells, immunos suggestive of ovarian primary and per Dr. Berline Lopes, she did not feel the CT biopsy would be needed. Advised we would confirm this with Dr. Alvy Bimler in the am and would most likely be cancelling the CT bx. Advised to call for any needs.

## 2022-07-28 NOTE — Telephone Encounter (Signed)
Pt called stating she had a paracentesis on Friday 9/29. She is supposed to start Chemo on Monday 10/9 as well as have a CT w/ biopsy, depending on what paracentesis results showed. She would like a call from either Dr. Berline Lopes or Joylene John NP, to discuss results and to let her know if she needs to have the biopsy.

## 2022-07-29 ENCOUNTER — Other Ambulatory Visit: Payer: Self-pay | Admitting: Radiology

## 2022-07-29 ENCOUNTER — Encounter (HOSPITAL_COMMUNITY): Payer: Self-pay

## 2022-07-29 ENCOUNTER — Ambulatory Visit (HOSPITAL_COMMUNITY)
Admission: RE | Admit: 2022-07-29 | Discharge: 2022-07-29 | Disposition: A | Discharge status: Home / Self Care | Payer: BC Managed Care – PPO | Source: Ambulatory Visit | Attending: Hematology and Oncology | Admitting: Hematology and Oncology

## 2022-07-29 ENCOUNTER — Other Ambulatory Visit: Payer: Self-pay

## 2022-07-29 ENCOUNTER — Telehealth: Payer: Self-pay

## 2022-07-29 DIAGNOSIS — Z853 Personal history of malignant neoplasm of breast: Secondary | ICD-10-CM | POA: Insufficient documentation

## 2022-07-29 DIAGNOSIS — Z452 Encounter for adjustment and management of vascular access device: Secondary | ICD-10-CM | POA: Diagnosis not present

## 2022-07-29 DIAGNOSIS — C481 Malignant neoplasm of specified parts of peritoneum: Secondary | ICD-10-CM | POA: Insufficient documentation

## 2022-07-29 DIAGNOSIS — Z8571 Personal history of Hodgkin lymphoma: Secondary | ICD-10-CM | POA: Insufficient documentation

## 2022-07-29 DIAGNOSIS — C569 Malignant neoplasm of unspecified ovary: Secondary | ICD-10-CM | POA: Diagnosis not present

## 2022-07-29 HISTORY — PX: IR IMAGING GUIDED PORT INSERTION: IMG5740

## 2022-07-29 MED ORDER — LIDOCAINE-EPINEPHRINE 1 %-1:100000 IJ SOLN
INTRAMUSCULAR | Status: AC
  Administered 2022-07-29: 15 mL
  Filled 2022-07-29: qty 1

## 2022-07-29 MED ORDER — SODIUM CHLORIDE 0.9 % IV SOLN: INTRAVENOUS | Status: DC

## 2022-07-29 MED ORDER — MIDAZOLAM HCL 2 MG/2ML IJ SOLN
INTRAMUSCULAR | Status: AC
  Filled 2022-07-29: qty 2

## 2022-07-29 MED ORDER — FENTANYL CITRATE (PF) 100 MCG/2ML IJ SOLN
INTRAMUSCULAR | Status: AC
  Filled 2022-07-29: qty 2

## 2022-07-29 MED ORDER — MIDAZOLAM HCL 2 MG/2ML IJ SOLN
INTRAMUSCULAR | Status: AC | PRN
  Administered 2022-07-29 (×3): 1 mg via INTRAVENOUS

## 2022-07-29 MED ORDER — HEPARIN SOD (PORK) LOCK FLUSH 100 UNIT/ML IV SOLN
INTRAVENOUS | Status: AC
  Administered 2022-07-29: 500 [IU]
  Filled 2022-07-29: qty 5

## 2022-07-29 MED ORDER — FENTANYL CITRATE (PF) 100 MCG/2ML IJ SOLN
INTRAMUSCULAR | Status: AC | PRN
  Administered 2022-07-29: 25 ug via INTRAVENOUS
  Administered 2022-07-29: 50 ug via INTRAVENOUS
  Administered 2022-07-29: 25 ug via INTRAVENOUS

## 2022-07-29 NOTE — Telephone Encounter (Signed)
CT guided biopsy scheduled for 10/9 has been cancelled per Dr. Berline Lopes. Pt is aware

## 2022-07-29 NOTE — Sedation Documentation (Signed)
Pt D/C home in [facility] with husband. Awake and alert.In no distress

## 2022-07-29 NOTE — H&P (Signed)
Chief Complaint: Patient was seen in consultation today for ovarian cancer  Referring Physician(s): Gorsuch,Ni  Supervising Physician: Pernell Dupre  Patient Status: [facility] - Out-pt  History of Present Illness: Lauren Chandler is a 56 y.o. female with past medical history of breast cancer, Hodgkin's disease recently known to IR from paracentesis 9/29 which showed malignant cells consistent with ovarian primary. She now has plans for upcoming chemotherapy and is in need of durable venous access.  She has been referred to IR for Port-A-Cath placement.    Lauren Chandler presents to [facility] today at the request of Dr. Bertis Ruddy.  She is in her usual state of health.  Diagnosis and treatment plan is all newly devised and this has been overwhelming for her this past week.  She is familiar with cancer-related care and treatment from her significant history.  She is tearful but understanding of the goals of the procedure and agreeable to proceed.  She believes her Port placement was previously on the left for breast cancer care, but has no objection to a right-sided Port at this time.  She has been NPO.  Her husband will be provided care and trasnportation today.   Past Medical History:  Diagnosis Date   BRCA2 gene mutation positive 01/08/2019   Breast cancer (HCC) 2007   Left   Hodgkin's disease (HCC) 10/26/1987   clinical   Malignant tumor of breast (HCC)    left   Thyroid dysfunction    2020, as results on Hodgkin's radiation    Past Surgical History:  Procedure Laterality Date   BREAST RECONSTRUCTION     2009   COLONOSCOPY     2017   excisional of bilateral breast     2007   Hysteroscopy w.biopsy     2012   IR PARACENTESIS  07/23/2022   KNEE ARTHROSCOPY     1985   laparoscopically assisted vaginal hysterectomy w/removal of tubes and/or ovaries  04/23/2019   LYMPH NODE BIOPSY  1989   MASTECTOMY Bilateral 2007   removal of spleen total     1989     Allergies: Patient has no known allergies.  Medications: Prior to Admission medications   Medication Sig Start Date End Date Taking? Authorizing Provider  acetaminophen (TYLENOL) 500 MG tablet Take 1,000 mg by mouth every 6 (six) hours as needed for moderate pain.   Yes [provider]  Cholecalciferol (DIALYVITE VITAMIN D 5000) 125 MCG (5000 UT) capsule Take 5,000 Units by mouth daily.   Yes [provider]  hydrOXYzine (VISTARIL) 25 MG capsule Take 1 capsule (25 mg total) by mouth 3 (three) times daily as needed. 07/23/22  Yes Carver Fila, MD  ibuprofen (ADVIL) 200 MG tablet Take 400 mg by mouth every 6 (six) hours as needed for moderate pain.   Yes [provider]  levothyroxine (SYNTHROID) 125 MCG tablet Take 125 mcg by mouth every morning. 06/30/22  Yes [provider]  pantoprazole (PROTONIX) 40 MG tablet Take 40 mg by mouth daily.   Yes [provider]  vitamin B-12 (CYANOCOBALAMIN) 500 MCG tablet Take 500 mcg by mouth daily.   Yes [provider]  aspirin EC 81 MG tablet Take 1 tablet (81 mg total) by mouth daily. Swallow whole. 07/27/22   [name], MD  dexamethasone (DECADRON) 4 MG tablet Take 2 tabs at the night before and 2 tab the morning of chemotherapy, every 3 weeks, by mouth x 6 cycles 07/23/22   Bertis Ruddy,  Ni, MD  lidocaine-prilocaine (EMLA) cream Apply to affected area once 07/23/22   Heath Lark, MD  ondansetron (ZOFRAN) 8 MG tablet Take 1 tablet (8 mg total) by mouth every 8 (eight) hours as needed for nausea or vomiting. Start on the third day after chemotherapy. 07/23/22   Heath Lark, MD  prochlorperazine (COMPAZINE) 10 MG tablet Take 1 tablet (10 mg total) by mouth every 6 (six) hours as needed for nausea or vomiting. 07/23/22   Heath Lark, MD     Family History  Problem Relation Age of Onset   Prostate cancer Father    Prostate cancer Brother    Colon cancer Maternal Grandmother    Breast cancer  Paternal Grandmother    Ovarian cancer Neg Hx    Endometrial cancer Neg Hx    Pancreatic cancer Neg Hx     Social History   Socioeconomic History   Marital status: Married    Spouse name: Not on file   Number of children: Not on file   Years of education: Not on file   Highest education level: Not on file  Occupational History   Occupation: retired  Tobacco Use   Smoking status: Never   Smokeless tobacco: Never  Substance and Sexual Activity   Alcohol use: Yes    Comment: rare   Drug use: Never   Sexual activity: Yes  Other Topics Concern   Not on file  Social History Narrative   Not on file   Social Determinants of Health   Financial Resource Strain: Not on file  Food Insecurity: Not on file  Transportation Needs: Not on file  Physical Activity: Not on file  Stress: Not on file  Social Connections: Not on file     Review of Systems: A 12 point ROS discussed and pertinent positives are indicated in the HPI above.  All other systems are negative.  Review of Systems  Constitutional:  Negative for fatigue and fever.  Respiratory:  Negative for cough and shortness of breath.   Cardiovascular:  Negative for chest pain.  Gastrointestinal:  Negative for abdominal pain, nausea and vomiting.  Musculoskeletal:  Negative for back pain.  Psychiatric/Behavioral:  Negative for behavioral problems and confusion.     Vital Signs: BP 113/68   Pulse (!) 109   Temp (!) 97.2 F (36.2 C) (Temporal)   Resp 16   Ht $R'5\' 3"'bw$  (1.6 m)   Wt 174 lb (78.9 kg)   SpO2 95%   BMI 30.82 kg/m   Physical Exam Vitals and nursing note reviewed.  Constitutional:      General: She is not in acute distress.    Appearance: Normal appearance. She is not ill-appearing.  HENT:     Mouth/Throat:     Mouth: Mucous membranes are moist.     Pharynx: Oropharynx is clear.  Neck:     Comments: ROM WNL, supple.  No prior surgical scars Cardiovascular:     Rate and Rhythm: Normal rate and regular  rhythm.  Pulmonary:     Effort: Pulmonary effort is normal.     Breath sounds: Normal breath sounds.  Abdominal:     General: Abdomen is flat.     Palpations: Abdomen is soft.  Skin:    Comments: Prior surgical scars from bilateral mastectomy.  No clear evidence of position of prior Port based on scarring.    Neurological:     General: No focal deficit present.     Mental Status: She is alert and oriented  to person, place, and time. Mental status is at baseline.  Psychiatric:        Mood and Affect: Mood normal.        Behavior: Behavior normal.        Thought Content: Thought content normal.        Judgment: Judgment normal.          Imaging: IR Paracentesis  Result Date: 07/23/2022 INDICATION: Malignant ascites EXAM: ULTRASOUND GUIDED diagnostic and therapeutic PARACENTESIS MEDICATIONS: 7 cc 1% lidocaine COMPLICATIONS: None immediate. PROCEDURE: Informed written consent was obtained from the patient after a discussion of the risks, benefits and alternatives to treatment. A timeout was performed prior to the initiation of the procedure. Initial ultrasound scanning demonstrates a large amount of ascites within the right lower abdominal quadrant. The right lower abdomen was prepped and draped in the usual sterile fashion. 1% lidocaine was used for local anesthesia. Following this, a 19 gauge, 7-cm, Yueh catheter was introduced. An ultrasound image was saved for documentation purposes. The paracentesis was performed. The catheter was removed and a dressing was applied. The patient tolerated the procedure well without immediate post procedural complication. FINDINGS: A total of approximately 4.2 L of yellow fluid was removed. Samples were sent to the laboratory as requested by the clinical team. IMPRESSION: Successful ultrasound-guided paracentesis yielding 4.2 liters of peritoneal fluid. Read by: Reatha Armour, PA-C Electronically Signed   By: Corrie Mckusick D.O.   On: 07/23/2022 16:50     Labs:  CBC: No results for input(s): "WBC", "HGB", "HCT", "PLT" in the last 8760 hours.  COAGS: No results for input(s): "INR", "APTT" in the last 8760 hours.  BMP: Recent Labs    07/23/22 0948  NA 133*  K 4.3  CL 98  CO2 28  GLUCOSE 101*  BUN 18  CALCIUM 9.0  CREATININE 1.07*  GFRNONAA >60    LIVER FUNCTION TESTS: No results for input(s): "BILITOT", "AST", "ALT", "ALKPHOS", "PROT", "ALBUMIN" in the last 8760 hours.  TUMOR MARKERS: Recent Labs    07/23/22 0948  CEA <1.00    Assessment and Plan: Patient with past medical history of ovarian cancer presents with complaint of breast cancer.  IR consulted for Port-A-Cath placement at the request of Dr. Alvy Bimler. Case reviewed by Dr. Denna Haggard who approves patient for procedure.  Patient presents today in their usual state of health.  She has been NPO and is not currently on blood thinners.   Risks and benefits of image guided port-a-catheter placement was discussed with the patient including, but not limited to bleeding, infection, pneumothorax, or fibrin sheath development and need for additional procedures.  All of the patient's questions were answered, patient is agreeable to proceed. Consent signed and in chart.  Thank you for this interesting consult.  I greatly enjoyed meeting Lauren Chandler and look forward to participating in their care.  A copy of this report was sent to the requesting provider on this date.  Electronically Signed: Docia Barrier, PA 07/29/2022, 9:31 AM   I spent a total of  30 Minutes   in face to face in clinical consultation, greater than 50% of which was counseling/coordinating care for ovarian cancer.

## 2022-07-29 NOTE — Procedures (Signed)
Interventional Radiology Procedure Note  Date of Procedure: 07/29/2022  Procedure: Port placement   Findings:  1. Port placement, right chest    Complications: No immediate complications noted.   Estimated Blood Loss: minimal  Follow-up and Recommendations: 1. Besdrest 1 hour  2. Ready for use    Albin Felling, MD  Vascular & Interventional Radiology  07/29/2022 11:53 AM

## 2022-07-30 ENCOUNTER — Encounter: Payer: Self-pay | Admitting: Hematology and Oncology

## 2022-07-30 ENCOUNTER — Other Ambulatory Visit: Payer: Self-pay | Admitting: Hematology and Oncology

## 2022-07-30 ENCOUNTER — Ambulatory Visit: Payer: BC Managed Care – PPO | Admitting: Hematology and Oncology

## 2022-07-30 ENCOUNTER — Inpatient Hospital Stay: Payer: BC Managed Care – PPO | Attending: Gynecologic Oncology

## 2022-07-30 DIAGNOSIS — R6881 Early satiety: Secondary | ICD-10-CM | POA: Diagnosis not present

## 2022-07-30 DIAGNOSIS — K5909 Other constipation: Secondary | ICD-10-CM | POA: Insufficient documentation

## 2022-07-30 DIAGNOSIS — Z79890 Hormone replacement therapy (postmenopausal): Secondary | ICD-10-CM | POA: Diagnosis not present

## 2022-07-30 DIAGNOSIS — Z79899 Other long term (current) drug therapy: Secondary | ICD-10-CM | POA: Insufficient documentation

## 2022-07-30 DIAGNOSIS — Z5111 Encounter for antineoplastic chemotherapy: Secondary | ICD-10-CM | POA: Diagnosis not present

## 2022-07-30 DIAGNOSIS — C482 Malignant neoplasm of peritoneum, unspecified: Secondary | ICD-10-CM | POA: Insufficient documentation

## 2022-07-30 DIAGNOSIS — M898X9 Other specified disorders of bone, unspecified site: Secondary | ICD-10-CM | POA: Diagnosis not present

## 2022-07-30 DIAGNOSIS — K219 Gastro-esophageal reflux disease without esophagitis: Secondary | ICD-10-CM | POA: Insufficient documentation

## 2022-07-30 DIAGNOSIS — J90 Pleural effusion, not elsewhere classified: Secondary | ICD-10-CM | POA: Diagnosis not present

## 2022-07-30 DIAGNOSIS — R180 Malignant ascites: Secondary | ICD-10-CM | POA: Insufficient documentation

## 2022-07-30 DIAGNOSIS — R140 Abdominal distension (gaseous): Secondary | ICD-10-CM | POA: Insufficient documentation

## 2022-07-30 DIAGNOSIS — I429 Cardiomyopathy, unspecified: Secondary | ICD-10-CM | POA: Insufficient documentation

## 2022-07-30 DIAGNOSIS — Z9081 Acquired absence of spleen: Secondary | ICD-10-CM | POA: Diagnosis not present

## 2022-07-30 LAB — CBC WITH DIFFERENTIAL (CANCER CENTER ONLY)
Abs Immature Granulocytes: 0.03 10*3/uL (ref 0.00–0.07)
Basophils Absolute: 0.1 10*3/uL (ref 0.0–0.1)
Basophils Relative: 1 %
Eosinophils Absolute: 0.3 10*3/uL (ref 0.0–0.5)
Eosinophils Relative: 4 %
HCT: 34 % — ABNORMAL LOW (ref 36.0–46.0)
Hemoglobin: 11.3 g/dL — ABNORMAL LOW (ref 12.0–15.0)
Immature Granulocytes: 0 %
Lymphocytes Relative: 17 %
Lymphs Abs: 1.5 10*3/uL (ref 0.7–4.0)
MCH: 28 pg (ref 26.0–34.0)
MCHC: 33.2 g/dL (ref 30.0–36.0)
MCV: 84.4 fL (ref 80.0–100.0)
Monocytes Absolute: 1.2 10*3/uL — ABNORMAL HIGH (ref 0.1–1.0)
Monocytes Relative: 13 %
Neutro Abs: 5.9 10*3/uL (ref 1.7–7.7)
Neutrophils Relative %: 65 %
Platelet Count: 694 10*3/uL — ABNORMAL HIGH (ref 150–400)
RBC: 4.03 MIL/uL (ref 3.87–5.11)
RDW: 14.9 % (ref 11.5–15.5)
WBC Count: 9 10*3/uL (ref 4.0–10.5)
nRBC: 0 % (ref 0.0–0.2)

## 2022-07-30 LAB — CMP (CANCER CENTER ONLY)
ALT: 14 U/L (ref 0–44)
AST: 27 U/L (ref 15–41)
Albumin: 3.1 g/dL — ABNORMAL LOW (ref 3.5–5.0)
Alkaline Phosphatase: 54 U/L (ref 38–126)
Anion gap: 7 (ref 5–15)
BUN: 21 mg/dL — ABNORMAL HIGH (ref 6–20)
CO2: 26 mmol/L (ref 22–32)
Calcium: 8.8 mg/dL — ABNORMAL LOW (ref 8.9–10.3)
Chloride: 101 mmol/L (ref 98–111)
Creatinine: 1 mg/dL (ref 0.44–1.00)
GFR, Estimated: >60 mL/min (ref >60)
Glucose, Bld: 103 mg/dL — ABNORMAL HIGH (ref 70–99)
Potassium: 4.4 mmol/L (ref 3.5–5.1)
Sodium: 134 mmol/L — ABNORMAL LOW (ref 135–145)
Total Bilirubin: 0.3 mg/dL (ref 0.3–1.2)
Total Protein: 7.7 g/dL (ref 6.5–8.1)

## 2022-07-30 MED ORDER — HEPARIN SOD (PORK) LOCK FLUSH 100 UNIT/ML IV SOLN
500.0000 [IU] | Freq: Once | INTRAVENOUS | Status: AC
  Administered 2022-07-30: 500 [IU]

## 2022-07-30 MED ORDER — SODIUM CHLORIDE 0.9% FLUSH
10.0000 mL | Freq: Once | INTRAVENOUS | Status: AC
  Administered 2022-07-30: 10 mL

## 2022-07-30 MED FILL — Dexamethasone Sodium Phosphate Inj 100 MG/10ML: INTRAMUSCULAR | Qty: 1 | Status: AC

## 2022-07-30 MED FILL — Fosaprepitant Dimeglumine For IV Infusion 150 MG (Base Eq): INTRAVENOUS | Qty: 5 | Status: AC

## 2022-08-02 ENCOUNTER — Inpatient Hospital Stay: Payer: BC Managed Care – PPO | Admitting: Licensed Clinical Social Worker

## 2022-08-02 ENCOUNTER — Other Ambulatory Visit: Payer: Self-pay

## 2022-08-02 ENCOUNTER — Encounter: Payer: Self-pay | Admitting: Hematology and Oncology

## 2022-08-02 ENCOUNTER — Inpatient Hospital Stay: Payer: BC Managed Care – PPO | Admitting: Hematology and Oncology

## 2022-08-02 ENCOUNTER — Other Ambulatory Visit: Payer: BC Managed Care – PPO

## 2022-08-02 ENCOUNTER — Telehealth: Payer: Self-pay

## 2022-08-02 ENCOUNTER — Ambulatory Visit (HOSPITAL_COMMUNITY): Payer: BC Managed Care – PPO

## 2022-08-02 ENCOUNTER — Inpatient Hospital Stay: Payer: BC Managed Care – PPO

## 2022-08-02 ENCOUNTER — Ambulatory Visit (HOSPITAL_BASED_OUTPATIENT_CLINIC_OR_DEPARTMENT_OTHER): Payer: BC Managed Care – PPO | Admitting: Physician Assistant

## 2022-08-02 VITALS — BP 118/71 | HR 108 | Resp 17

## 2022-08-02 VITALS — BP 138/93 | HR 124 | Temp 97.4°F | Resp 18 | Ht 63.0 in | Wt 181.0 lb

## 2022-08-02 DIAGNOSIS — I429 Cardiomyopathy, unspecified: Secondary | ICD-10-CM | POA: Diagnosis not present

## 2022-08-02 DIAGNOSIS — M898X9 Other specified disorders of bone, unspecified site: Secondary | ICD-10-CM | POA: Diagnosis not present

## 2022-08-02 DIAGNOSIS — C482 Malignant neoplasm of peritoneum, unspecified: Secondary | ICD-10-CM | POA: Diagnosis not present

## 2022-08-02 DIAGNOSIS — R140 Abdominal distension (gaseous): Secondary | ICD-10-CM | POA: Diagnosis not present

## 2022-08-02 DIAGNOSIS — R6881 Early satiety: Secondary | ICD-10-CM | POA: Diagnosis not present

## 2022-08-02 DIAGNOSIS — R180 Malignant ascites: Secondary | ICD-10-CM | POA: Diagnosis not present

## 2022-08-02 DIAGNOSIS — Z79899 Other long term (current) drug therapy: Secondary | ICD-10-CM | POA: Diagnosis not present

## 2022-08-02 DIAGNOSIS — K5909 Other constipation: Secondary | ICD-10-CM | POA: Diagnosis not present

## 2022-08-02 DIAGNOSIS — K219 Gastro-esophageal reflux disease without esophagitis: Secondary | ICD-10-CM | POA: Diagnosis not present

## 2022-08-02 DIAGNOSIS — R110 Nausea: Secondary | ICD-10-CM

## 2022-08-02 DIAGNOSIS — Z9081 Acquired absence of spleen: Secondary | ICD-10-CM | POA: Diagnosis not present

## 2022-08-02 DIAGNOSIS — Z79890 Hormone replacement therapy (postmenopausal): Secondary | ICD-10-CM | POA: Diagnosis not present

## 2022-08-02 DIAGNOSIS — Z5111 Encounter for antineoplastic chemotherapy: Secondary | ICD-10-CM | POA: Diagnosis not present

## 2022-08-02 DIAGNOSIS — J90 Pleural effusion, not elsewhere classified: Secondary | ICD-10-CM | POA: Diagnosis not present

## 2022-08-02 MED ORDER — SODIUM CHLORIDE 0.9% FLUSH
10.0000 mL | INTRAVENOUS | Status: DC | PRN
  Administered 2022-08-02: 10 mL

## 2022-08-02 MED ORDER — SODIUM CHLORIDE 0.9 % IV SOLN
638.4000 mg | Freq: Once | INTRAVENOUS | Status: AC
  Administered 2022-08-02: 640 mg via INTRAVENOUS
  Filled 2022-08-02: qty 64

## 2022-08-02 MED ORDER — METHYLPREDNISOLONE SODIUM SUCC 125 MG IJ SOLR
125.0000 mg | Freq: Once | INTRAMUSCULAR | Status: AC | PRN
  Administered 2022-08-02: 125 mg via INTRAVENOUS

## 2022-08-02 MED ORDER — SODIUM CHLORIDE 0.9 % IV SOLN
10.0000 mg | Freq: Once | INTRAVENOUS | Status: AC
  Administered 2022-08-02: 10 mg via INTRAVENOUS
  Filled 2022-08-02: qty 10

## 2022-08-02 MED ORDER — SODIUM CHLORIDE 0.9 % IV SOLN
175.0000 mg/m2 | Freq: Once | INTRAVENOUS | Status: AC
  Administered 2022-08-02: 336 mg via INTRAVENOUS
  Filled 2022-08-02: qty 56

## 2022-08-02 MED ORDER — PALONOSETRON HCL INJECTION 0.25 MG/5ML
0.2500 mg | Freq: Once | INTRAVENOUS | Status: AC
  Administered 2022-08-02: 0.25 mg via INTRAVENOUS
  Filled 2022-08-02: qty 5

## 2022-08-02 MED ORDER — SODIUM CHLORIDE 0.9 % IV SOLN
150.0000 mg | Freq: Once | INTRAVENOUS | Status: AC
  Administered 2022-08-02: 150 mg via INTRAVENOUS
  Filled 2022-08-02: qty 150

## 2022-08-02 MED ORDER — SODIUM CHLORIDE 0.9 % IV SOLN: Freq: Once | INTRAVENOUS | Status: AC

## 2022-08-02 MED ORDER — CETIRIZINE HCL 10 MG/ML IV SOLN
10.0000 mg | Freq: Once | INTRAVENOUS | Status: AC
  Administered 2022-08-02: 10 mg via INTRAVENOUS
  Filled 2022-08-02: qty 1

## 2022-08-02 MED ORDER — SODIUM CHLORIDE 0.9 % IV SOLN: Freq: Once | INTRAVENOUS | Status: DC | PRN

## 2022-08-02 MED ORDER — HEPARIN SOD (PORK) LOCK FLUSH 100 UNIT/ML IV SOLN
500.0000 [IU] | Freq: Once | INTRAVENOUS | Status: AC | PRN
  Administered 2022-08-02: 500 [IU]

## 2022-08-02 MED ORDER — FAMOTIDINE IN NACL 20-0.9 MG/50ML-% IV SOLN
20.0000 mg | Freq: Once | INTRAVENOUS | Status: AC
  Administered 2022-08-02: 20 mg via INTRAVENOUS
  Filled 2022-08-02: qty 50

## 2022-08-02 NOTE — Progress Notes (Signed)
Late note- around 1455 pt started c/o feeling nauseous, flushed (red faced) and "not feeling well"  Anda Kraft PA paged and at bedside.  See MAR for medications and flowsheets for frequent VS

## 2022-08-02 NOTE — Assessment & Plan Note (Signed)
Her abdomen is visibly distended and she is having recurrent reflux sensation and difficulties with eating I recommend urgent paracentesis

## 2022-08-02 NOTE — Patient Instructions (Signed)
[facility] MEDICAL [facility]COLOGY  Discharge Instructions: Thank you for choosing [facility] to provide your oncology and hematology care.   If you have a lab appointment with the Cancer Center, please go directly to the Cancer Center and check in at the registration area.   Wear comfortable clothing and clothing appropriate for easy access to any Portacath or PICC line.   We strive to give you quality time with your provider. You may need to reschedule your appointment if you arrive late (15 or more minutes).  Arriving late affects you and other patients whose appointments are after yours.  Also, if you miss three or more appointments without notifying the office, you may be dismissed from the clinic at the provider's discretion.      For prescription refill requests, have your pharmacy contact our office and allow 72 hours for refills to be completed.    Today you received the following chemotherapy and/or immunotherapy agents: Taxol, carboplatin      To help prevent nausea and vomiting after your treatment, we encourage you to take your nausea medication as directed.  BELOW ARE SYMPTOMS THAT SHOULD BE REPORTED IMMEDIATELY: *FEVER GREATER THAN 100.4 F (38 C) OR HIGHER *CHILLS OR SWEATING *NAUSEA AND VOMITING THAT IS NOT CONTROLLED WITH YOUR NAUSEA MEDICATION *UNUSUAL SHORTNESS OF BREATH *UNUSUAL BRUISING OR BLEEDING *URINARY PROBLEMS (pain or burning when urinating, or frequent urination) *BOWEL PROBLEMS (unusual diarrhea, constipation, pain near the anus) TENDERNESS IN MOUTH AND THROAT WITH OR WITHOUT PRESENCE OF ULCERS (sore throat, sores in mouth, or a toothache) UNUSUAL RASH, SWELLING OR PAIN  UNUSUAL VAGINAL DISCHARGE OR ITCHING   Items with * indicate a potential emergency and should be followed up as soon as possible or go to the Emergency Department if any problems should occur.  Please show the CHEMOTHERAPY ALERT CARD or IMMUNOTHERAPY ALERT CARD at  check-in to the Emergency Department and triage nurse.  Should you have questions after your visit or need to cancel or reschedule your appointment, please contact [facility]  Dept: [phone]  and follow the prompts.  Office hours are 8:00 a.m. to 4:30 p.m. Monday - Friday. Please note that voicemails left after 4:00 p.m. may not be returned until the following business day.  We are closed weekends and major holidays. You have access to a nurse at all times for urgent questions. Please call the main number to the clinic Dept: [phone] and follow the prompts.   For any non-urgent questions, you may also contact your provider using MyChart. We now offer e-Visits for anyone 18 and older to request care online for non-urgent symptoms. For details visit [email or web address].   Also download the MyChart app! Go to the app store, search "MyChart", open the app, select [facility], and log in with your MyChart username and password.  Masks are optional in the cancer centers. If you would like for your care team to wear a mask while they are taking care of you, please let them know. You may have one support person who is at least 56 years old accompany you for your appointments. Paclitaxel Injection What is this medication? PACLITAXEL (PAK li TAX el) treats some types of cancer. It works by slowing down the growth of cancer cells. This medicine may be used for other purposes; ask your health care provider or pharmacist if you have questions. COMMON BRAND NAME(S): Onxol, Taxol What should I tell my care team before I take this   medication? They need to know if you have any of these conditions: Heart disease Liver disease Low white blood cell levels An unusual or allergic reaction to paclitaxel, other medications, foods, dyes, or preservatives If you or your partner are pregnant or trying to get pregnant Breast-feeding How should I use this medication? This  medication is injected into a vein. It is given by your care team in a hospital or clinic setting. Talk to your care team about the use of this medication in children. While it may be given to children for selected conditions, precautions do apply. Overdosage: If you think you have taken too much of this medicine contact a poison control center or emergency room at once. NOTE: This medicine is only for you. Do not share this medicine with others. What if I miss a dose? Keep appointments for follow-up doses. It is important not to miss your dose. Call your care team if you are unable to keep an appointment. What may interact with this medication? Do not take this medication with any of the following: Live virus vaccines Other medications may affect the way this medication works. Talk with your care team about all of the medications you take. They may suggest changes to your treatment plan to lower the risk of side effects and to make sure your medications work as intended. This list may not describe all possible interactions. Give your health care provider a list of all the medicines, herbs, non-prescription drugs, or dietary supplements you use. Also tell them if you smoke, drink alcohol, or use illegal drugs. Some items may interact with your medicine. What should I watch for while using this medication? Your condition will be monitored carefully while you are receiving this medication. You may need blood work while taking this medication. This medication may make you feel generally unwell. This is not uncommon as chemotherapy can affect healthy cells as well as cancer cells. Report any side effects. Continue your course of treatment even though you feel ill unless your care team tells you to stop. This medication can cause serious allergic reactions. To reduce the risk, your care team may give you other medications to take before receiving this one. Be sure to follow the directions from your care  team. This medication may increase your risk of getting an infection. Call your care team for advice if you get a fever, chills, sore throat, or other symptoms of a cold or flu. Do not treat yourself. Try to avoid being around people who are sick. This medication may increase your risk to bruise or bleed. Call your care team if you notice any unusual bleeding. Be careful brushing or flossing your teeth or using a toothpick because you may get an infection or bleed more easily. If you have any dental work done, tell your dentist you are receiving this medication. Talk to your care team if you may be pregnant. Serious birth defects can occur if you take this medication during pregnancy. Talk to your care team before breastfeeding. Changes to your treatment plan may be needed. What side effects may I notice from receiving this medication? Side effects that you should report to your care team as soon as possible: Allergic reactions--skin rash, itching, hives, swelling of the face, lips, tongue, or throat Heart rhythm changes--fast or irregular heartbeat, dizziness, feeling faint or lightheaded, chest pain, trouble breathing Increase in blood pressure Infection--fever, chills, cough, sore throat, wounds that don't heal, pain or trouble when passing urine, general feeling of   discomfort or being unwell Low blood pressure--dizziness, feeling faint or lightheaded, blurry vision Low red blood cell level--unusual weakness or fatigue, dizziness, headache, trouble breathing Painful swelling, warmth, or redness of the skin, blisters or sores at the infusion site Pain, tingling, or numbness in the hands or feet Slow heartbeat--dizziness, feeling faint or lightheaded, confusion, trouble breathing, unusual weakness or fatigue Unusual bruising or bleeding Side effects that usually do not require medical attention (report to your care team if they continue or are bothersome): Diarrhea Hair loss Joint pain Loss of  appetite Muscle pain Nausea Vomiting This list may not describe all possible side effects. Call your doctor for medical advice about side effects. You may report side effects to FDA at 1-800-FDA-1088. Where should I keep my medication? This medication is given in a hospital or clinic. It will not be stored at home. NOTE: This sheet is a summary. It may not cover all possible information. If you have questions about this medicine, talk to your doctor, pharmacist, or health care provider.  2023 Elsevier/Gold Standard (2022-02-25 00:00:00) Carboplatin Injection What is this medication? CARBOPLATIN (KAR boe pla tin) treats some types of cancer. It works by slowing down the growth of cancer cells. This medicine may be used for other purposes; ask your health care provider or pharmacist if you have questions. COMMON BRAND NAME(S): Paraplatin What should I tell my care team before I take this medication? They need to know if you have any of these conditions: Blood disorders Hearing problems Kidney disease Recent or ongoing radiation therapy An unusual or allergic reaction to carboplatin, cisplatin, other medications, foods, dyes, or preservatives Pregnant or trying to get pregnant Breast-feeding How should I use this medication? This medication is injected into a vein. It is given by your care team in a hospital or clinic setting. Talk to your care team about the use of this medication in children. Special care may be needed. Overdosage: If you think you have taken too much of this medicine contact a poison control center or emergency room at once. NOTE: This medicine is only for you. Do not share this medicine with others. What if I miss a dose? Keep appointments for follow-up doses. It is important not to miss your dose. Call your care team if you are unable to keep an appointment. What may interact with this medication? Medications for seizures Some antibiotics, such as amikacin,  gentamicin, neomycin, streptomycin, tobramycin Vaccines This list may not describe all possible interactions. Give your health care provider a list of all the medicines, herbs, non-prescription drugs, or dietary supplements you use. Also tell them if you smoke, drink alcohol, or use illegal drugs. Some items may interact with your medicine. What should I watch for while using this medication? Your condition will be monitored carefully while you are receiving this medication. You may need blood work while taking this medication. This medication may make you feel generally unwell. This is not uncommon, as chemotherapy can affect healthy cells as well as cancer cells. Report any side effects. Continue your course of treatment even though you feel ill unless your care team tells you to stop. In some cases, you may be given additional medications to help with side effects. Follow all directions for their use. This medication may increase your risk of getting an infection. Call your care team for advice if you get a fever, chills, sore throat, or other symptoms of a cold or flu. Do not treat yourself. Try to avoid being around   people who are sick. Avoid taking medications that contain aspirin, acetaminophen, ibuprofen, naproxen, or ketoprofen unless instructed by your care team. These medications may hide a fever. Be careful brushing or flossing your teeth or using a toothpick because you may get an infection or bleed more easily. If you have any dental work done, tell your dentist you are receiving this medication. Talk to your care team if you wish to become pregnant or think you might be pregnant. This medication can cause serious birth defects. Talk to your care team about effective forms of contraception. Do not breast-feed while taking this medication. What side effects may I notice from receiving this medication? Side effects that you should report to your care team as soon as possible: Allergic  reactions--skin rash, itching, hives, swelling of the face, lips, tongue, or throat Infection--fever, chills, cough, sore throat, wounds that don't heal, pain or trouble when passing urine, general feeling of discomfort or being unwell Low red blood cell level--unusual weakness or fatigue, dizziness, headache, trouble breathing Pain, tingling, or numbness in the hands or feet, muscle weakness, change in vision, confusion or trouble speaking, loss of balance or coordination, trouble walking, seizures Unusual bruising or bleeding Side effects that usually do not require medical attention (report to your care team if they continue or are bothersome): Hair loss Nausea Unusual weakness or fatigue Vomiting This list may not describe all possible side effects. Call your doctor for medical advice about side effects. You may report side effects to FDA at 1-800-FDA-1088. Where should I keep my medication? This medication is given in a hospital or clinic. It will not be stored at home. NOTE: This sheet is a summary. It may not cover all possible information. If you have questions about this medicine, talk to your doctor, pharmacist, or health care provider.  2023 Elsevier/Gold Standard (2022-02-02 00:00:00)  

## 2022-08-02 NOTE — Progress Notes (Signed)
Per Dr Gorsuch, ok to proceed with elevated HR today 

## 2022-08-02 NOTE — Progress Notes (Signed)
Pt returned to baseline.  VSS, no nausea or flushing.  Per Anda Kraft PA, ok to restart Taxol at previous rate.  Pt aware of need to alert RN with any changes in status

## 2022-08-02 NOTE — Telephone Encounter (Signed)
Called and left a message paracentesis scheduled 10/10 at 0930 at [facility], arrive at 9 am. Ask her to call the office for questions.

## 2022-08-02 NOTE — Assessment & Plan Note (Signed)
We discussed the importance of aggressive laxatives

## 2022-08-02 NOTE — Progress Notes (Signed)
[facility]  Initial Assessment   Lauren Chandler is a 56 y.o. year old female accompanied by spouse, Lauren Chandler. Clinical Social Work was referred by new patient protocol for assessment of psychosocial needs.   SDOH (Social Determinants of Health) assessments performed: Yes SDOH Interventions    Flowsheet Row Clinical Support from 08/02/2022 in [facility]  SDOH Interventions   Food Insecurity Interventions Intervention Not Indicated  Transportation Interventions Intervention Not Indicated  Financial Strain Interventions Intervention Not Indicated       SDOH Screenings   Food Insecurity: No Food Insecurity (08/02/2022)  Transportation Needs: No Transportation Needs (08/02/2022)  Financial Resource Strain: Low Risk  (08/02/2022)  Tobacco Use: Low Risk  (08/02/2022)     Distress Screen completed: No     No data to display            Family/Social Information:  Housing Arrangement: patient lives with spouse, 61 yo son. 3 other adult children outside the home Family members/support persons in your life? Family Transportation concerns: no  Employment: Retired .  Income source: retirement, spouse Financial concerns: No Type of concern: None Food access concerns: no Religious or spiritual practice: Not known Services Currently in place:  n/a  Coping/ Adjustment to diagnosis: Patient understands treatment plan and what happens next? Yes. They understand the plan and are having another paracentesis tomorrow. Most concerned about 62yo son and how he is coping. He has connected recently with mental health support for other stressors. 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  and Supportive family/friends     SUMMARY: Current SDOH Barriers:  No major SDOH  barriers Concerns with family support  Clinical Social Work Clinical Goal(s):  Explore and connect with resources for family support with assistance of CSW  Interventions: Discussed common feeling and emotions when being diagnosed with cancer, and the importance of support during treatment Informed patient of the support team roles and support services at [facility] Provided [place] contact information and encouraged patient to call with any questions or concerns Encouraged pt/family to utilize supports through [facility]   Follow Up Plan: Patient will contact CSW with any support or resource needs Patient verbalizes understanding of plan: Yes    Lauren Chandler E Lauren Risinger, LCSW

## 2022-08-02 NOTE — Progress Notes (Signed)
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[facility]) I have reviewed recent cytology report and gave her a copy I explained to her and her husband the rationale of not pursuing CT-guided biopsy due to positive cytology We will proceed with neoadjuvant chemotherapy today I plan to see her weekly to assess whether she needs repeat paracentesis on not We discussed importance of avoiding constipation and we discussed the benefits of using cryotherapy to reduce risk of neuropathy Due to significant symptomatic ascites, I will arrange for urgent paracentesis   Malignant ascites Her abdomen is visibly distended and she is having recurrent reflux sensation and difficulties with eating I recommend urgent paracentesis  Other constipation We discussed the importance of aggressive laxatives  Orders Placed This Encounter  Procedures   US Paracentesis    Standing Status:   Future    Standing Expiration Date:   08/02/2023    Order Specific Question:   If therapeutic, is there a maximum amount of fluid to be removed?    Answer:   Yes    Order Specific Question:   What is the maximum amount of fluide to be removed?    Answer:   5 liters    Order Specific Question:   Are labs required for specimen collection?    Answer:   No    Order Specific Question:   Is Albumin medication needed?    Answer:   No    Order Specific Question:   Reason for Exam (SYMPTOM  OR DIAGNOSIS REQUIRED)    Answer:   malignant ascites    Order Specific Question:   Preferred imaging location?    Answer:   [facility]    All questions were answered. The patient knows to call the clinic with any problems, questions or concerns. The total time spent in the appointment was  30 minutes encounter with patients including review of chart and various tests results, discussions about plan of care and coordination of care plan   Heath Lark, MD 08/02/2022 9:23 AM  INTERVAL HISTORY: Please see below for problem oriented charting. she returns for treatment follow-up seen prior to cycle 1 of treatment She is here accompanied by her husband I have reviewed her documented food intake She is able to maintain approximately 90 g of protein and have almost daily bowel movement She is getting progressively uncomfortable with abdominal distention and have multiple reflux sensation and early satiety  REVIEW OF SYSTEMS:   Constitutional: Denies fevers, chills or abnormal weight loss Eyes: Denies blurriness of vision Ears, nose, mouth, throat, and face: Denies mucositis or sore throat Respiratory: Denies cough, dyspnea or wheezes Cardiovascular: Denies palpitation, chest discomfort or lower extremity swelling Skin: Denies abnormal skin rashes Lymphatics: Denies new lymphadenopathy or easy bruising Neurological:Denies numbness, tingling or new weaknesses Behavioral/Psych: Mood is stable, no new changes  All other systems were reviewed with the patient and are negative.  I have reviewed the past medical history, past surgical history, social history and family history with the patient and they are unchanged from previous note.  ALLERGIES:  has No Known Allergies.  MEDICATIONS:  Current Outpatient Medications  Medication Sig Dispense Refill   acetaminophen (TYLENOL) 500 MG tablet Take 1,000 mg by mouth every 6 (six) hours as needed for moderate  pain.     aspirin EC 81 MG tablet Take 1 tablet (81 mg total) by mouth daily. Swallow whole. 90 tablet 3   Cholecalciferol (DIALYVITE VITAMIN D 5000) 125 MCG (5000 UT) capsule Take 5,000 Units by mouth daily.     dexamethasone (DECADRON) 4 MG tablet Take 2 tabs at the night before and 2 tab the morning of chemotherapy, every 3 weeks, by  mouth x 6 cycles 36 tablet 6   hydrOXYzine (VISTARIL) 25 MG capsule Take 1 capsule (25 mg total) by mouth 3 (three) times daily as needed. 30 capsule 2   ibuprofen (ADVIL) 200 MG tablet Take 400 mg by mouth every 6 (six) hours as needed for moderate pain.     levothyroxine (SYNTHROID) 125 MCG tablet Take 125 mcg by mouth every morning.     lidocaine-prilocaine (EMLA) cream Apply to affected area once 30 g 3   ondansetron (ZOFRAN) 8 MG tablet Take 1 tablet (8 mg total) by mouth every 8 (eight) hours as needed for nausea or vomiting. Start on the third day after chemotherapy. 30 tablet 1   pantoprazole (PROTONIX) 40 MG tablet Take 40 mg by mouth daily.     prochlorperazine (COMPAZINE) 10 MG tablet Take 1 tablet (10 mg total) by mouth every 6 (six) hours as needed for nausea or vomiting. 30 tablet 1   vitamin B-12 (CYANOCOBALAMIN) 500 MCG tablet Take 500 mcg by mouth daily.     No current facility-administered medications for this visit.    SUMMARY OF ONCOLOGIC HISTORY: Oncology History  Primary peritoneal adenocarcinoma ([place])  07/19/2022 Imaging   1. Small left pleural effusion  2. 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[place])   07/23/2022 Cancer Staging   Staging form: Ovary, Fallopian Tube, and Primary Peritoneal Carcinoma, AJCC 8th Edition - Clinical stage from 07/23/2022: FIGO Stage IIIC (cT3c, cN0, cM0) - Signed by Heath Lark, MD on 07/23/2022 Stage prefix: Initial diagnosis   07/23/2022 Pathology Results   FINAL MICROSCOPIC DIAGNOSIS:  - Malignant cells present  - See comment   SPECIMEN ADEQUACY:  Satisfactory for evaluation   DIAGNOSTIC COMMENTS:  Immunohistochemical stains show that the tumor cells are positive for ER, PAX8, p16 and p53 (clonal overexpression pattern), consistent with a high-grade serous carcinoma.  Immunostain for WT1 is also diffusely positive in the tumor cells, most  consistent with an ovarian primary.  Dr. Arby Barrette reviewed the case and concurs with the above diagnosis.    07/30/2022 Procedure   Successful right chest port placement via the right internal jugular vein. The port is ready for immediate use.   08/02/2022 -  Chemotherapy   Patient is on Treatment Plan : OVARIAN Carboplatin (AUC 6) + Paclitaxel (175) q21d X 6 Cycles       PHYSICAL EXAMINATION: ECOG PERFORMANCE STATUS: 2 - Symptomatic, <50% confined to bed  Vitals:   08/02/22 0853  BP: (!) 138/93  Pulse: (!) 124  Resp: 18  Temp: (!) 97.4 F (36.3 C)  SpO2: 98%   Filed Weights   08/02/22 0853  Weight: 181 lb (82.1 kg)    GENERAL:alert, no distress and comfortable ABDOMEN:abdomen is significantly distended compared to previous visit Musculoskeletal:no cyanosis of digits and no clubbing  NEURO: alert & oriented x 3 with fluent speech, no focal motor/sensory deficits  LABORATORY DATA:  I have reviewed the data as listed    Component Value Date/Time   NA 134 (L) 07/30/2022 1610  K 4.4 07/30/2022 0928   CL 101 07/30/2022 0928   CO2 26 07/30/2022 0928   GLUCOSE 103 (H) 07/30/2022 0928   BUN 21 (H) 07/30/2022 0928   CREATININE 1.00 07/30/2022 0928   CALCIUM 8.8 (L) 07/30/2022 0928   PROT 7.7 07/30/2022 0928   ALBUMIN 3.1 (L) 07/30/2022 0928   AST 27 07/30/2022 0928   ALT 14 07/30/2022 0928   ALKPHOS 54 07/30/2022 0928   BILITOT 0.3 07/30/2022 0928   GFRNONAA >60 07/30/2022 0928    No results found for: "SPEP", "UPEP"  Lab Results  Component Value Date   WBC 9.0 07/30/2022   NEUTROABS 5.9 07/30/2022   HGB 11.3 (L) 07/30/2022   HCT 34.0 (L) 07/30/2022   MCV 84.4 07/30/2022   PLT 694 (H) 07/30/2022      Chemistry      Component Value Date/Time   NA 134 (L) 07/30/2022 0928   K 4.4 07/30/2022 0928   CL 101 07/30/2022 0928   CO2 26 07/30/2022 0928   BUN 21 (H) 07/30/2022 0928   CREATININE 1.00 07/30/2022 0928      Component Value Date/Time   CALCIUM 8.8  (L) 07/30/2022 0928   ALKPHOS 54 07/30/2022 0928   AST 27 07/30/2022 0928   ALT 14 07/30/2022 0928   BILITOT 0.3 07/30/2022 5397

## 2022-08-02 NOTE — Progress Notes (Signed)
Patient seen in infusion center for nausea and facial flushing during first time taxol infusion. She was 2.5 hours into infusion when she reported feeling nauseous and had flushed cheeks. She had just eaten a protein bar. The infusion was stopped and 125 mg IV solumedrol administered by RN. I gathered more information from patient and spouse who report she has this reaction after eating over the last week. Symptoms not suggestive of chemotherapy reaction. Dr. Alvy Bimler made aware.  Patient was observed for 20 minutes and had returned to baseline. She tolerated remainder of taxol without adverse reactions.

## 2022-08-02 NOTE — Assessment & Plan Note (Signed)
I have reviewed recent cytology report and gave her a copy I explained to her and her husband the rationale of not pursuing CT-guided biopsy due to positive cytology We will proceed with neoadjuvant chemotherapy today I plan to see her weekly to assess whether she needs repeat paracentesis on not We discussed importance of avoiding constipation and we discussed the benefits of using cryotherapy to reduce risk of neuropathy Due to significant symptomatic ascites, I will arrange for urgent paracentesis

## 2022-08-03 ENCOUNTER — Other Ambulatory Visit: Payer: Self-pay | Admitting: *Deleted

## 2022-08-03 ENCOUNTER — Ambulatory Visit (HOSPITAL_COMMUNITY)
Admission: RE | Admit: 2022-08-03 | Discharge: 2022-08-03 | Disposition: A | Discharge status: Home / Self Care | Payer: BC Managed Care – PPO | Source: Ambulatory Visit | Attending: Hematology and Oncology | Admitting: Hematology and Oncology

## 2022-08-03 DIAGNOSIS — R180 Malignant ascites: Secondary | ICD-10-CM | POA: Insufficient documentation

## 2022-08-03 DIAGNOSIS — C482 Malignant neoplasm of peritoneum, unspecified: Secondary | ICD-10-CM | POA: Diagnosis not present

## 2022-08-03 DIAGNOSIS — R188 Other ascites: Secondary | ICD-10-CM | POA: Diagnosis not present

## 2022-08-03 MED ORDER — LIDOCAINE HCL 1 % IJ SOLN
INTRAMUSCULAR | Status: AC
  Administered 2022-08-03: 10 mL
  Filled 2022-08-03: qty 20

## 2022-08-03 MED ORDER — LIDOCAINE HCL 1 % IJ SOLN
INTRAMUSCULAR | Status: AC
  Administered 2022-08-03: 15 mL
  Filled 2022-08-03: qty 20

## 2022-08-03 NOTE — Procedures (Addendum)
PROCEDURE SUMMARY:  Successful US guided therapeutic paracentesis from RLQ.  Yielded 4.1 L of clear, yellow fluid.  No immediate complications.  Pt tolerated well.   Specimen not sent for labs.  EBL < 1 mL  Lauren Chandler, AGNP 08/03/2022 12:07 PM

## 2022-08-03 NOTE — Progress Notes (Signed)
The proposed treatment discussed in conference is for discussion purpose only and is not a binding recommendation.  The patients have not been physically examined, or presented with their treatment options.  Therefore, final treatment plans cannot be decided.  

## 2022-08-05 ENCOUNTER — Other Ambulatory Visit: Payer: Self-pay | Admitting: Hematology and Oncology

## 2022-08-05 ENCOUNTER — Telehealth: Payer: Self-pay

## 2022-08-05 DIAGNOSIS — M898X9 Other specified disorders of bone, unspecified site: Secondary | ICD-10-CM

## 2022-08-05 DIAGNOSIS — C482 Malignant neoplasm of peritoneum, unspecified: Secondary | ICD-10-CM

## 2022-08-05 MED ORDER — OXYCODONE HCL 5 MG PO TABS: 5.0000 mg | ORAL_TABLET | Freq: Four times a day (QID) | ORAL | 0 refills | Status: DC | PRN

## 2022-08-05 NOTE — Telephone Encounter (Signed)
Patient informed of new oxycodone prescription that had been sent in to help with her ongoing joint pain. Constipation precautions reviewed with patient.  Patient verbalized an understanding of the information and had no further questions.

## 2022-08-05 NOTE — Telephone Encounter (Signed)
-----   Message from Heath Lark, MD sent at 08/05/2022 11:33 AM EDT ----- Regarding: RE: Joint Pain - Request for Pain Relief Recommendations I sent low dose oxycodone to [facility] She can combine with tylenol Watch out for constipation ----- Message ----- From: Severiano Gilbert, RN Sent: 08/05/2022  11:21 AM EDT To: Flo Shanks, RN; Heath Lark, MD Subject: Joint Pain - Request for Pain Relief Recomme#  Hello, Patient called noting joint pain that has not been relieved with extra strength tylenol. Patient was calling for recommendations on other things she can try.  Patient's first chemotherapy treatment was on 10/9 where she had taxol/carbo. No GSF added to cycle. Patient does not currently have any additional pain medication prescribed to her. Please advise. Thank you, Junie Panning

## 2022-08-06 ENCOUNTER — Encounter: Payer: Self-pay | Admitting: Hematology and Oncology

## 2022-08-06 NOTE — Progress Notes (Signed)
Called pt to introduce myself as her Arboriculturist and to discuss the [employer].  I went over what it covers but she declined it.  I will request for the registration staff to give her my card in case she changes her mind and for any questions or concerns she may have in the future.

## 2022-08-07 LAB — CA 125: Cancer Antigen (CA) 125: 452 U/mL — ABNORMAL HIGH (ref 0.0–38.1)

## 2022-08-09 ENCOUNTER — Inpatient Hospital Stay (HOSPITAL_BASED_OUTPATIENT_CLINIC_OR_DEPARTMENT_OTHER): Payer: BC Managed Care – PPO | Admitting: Hematology and Oncology

## 2022-08-09 ENCOUNTER — Other Ambulatory Visit: Payer: Self-pay

## 2022-08-09 ENCOUNTER — Encounter: Payer: Self-pay | Admitting: Hematology and Oncology

## 2022-08-09 VITALS — BP 119/63 | HR 119 | Temp 97.4°F | Resp 18 | Ht 63.0 in | Wt 171.0 lb

## 2022-08-09 DIAGNOSIS — R180 Malignant ascites: Secondary | ICD-10-CM | POA: Diagnosis not present

## 2022-08-09 DIAGNOSIS — Z9081 Acquired absence of spleen: Secondary | ICD-10-CM | POA: Diagnosis not present

## 2022-08-09 DIAGNOSIS — J90 Pleural effusion, not elsewhere classified: Secondary | ICD-10-CM | POA: Diagnosis not present

## 2022-08-09 DIAGNOSIS — R140 Abdominal distension (gaseous): Secondary | ICD-10-CM | POA: Diagnosis not present

## 2022-08-09 DIAGNOSIS — Z79890 Hormone replacement therapy (postmenopausal): Secondary | ICD-10-CM | POA: Diagnosis not present

## 2022-08-09 DIAGNOSIS — Z5111 Encounter for antineoplastic chemotherapy: Secondary | ICD-10-CM | POA: Diagnosis not present

## 2022-08-09 DIAGNOSIS — C482 Malignant neoplasm of peritoneum, unspecified: Secondary | ICD-10-CM

## 2022-08-09 DIAGNOSIS — K219 Gastro-esophageal reflux disease without esophagitis: Secondary | ICD-10-CM | POA: Diagnosis not present

## 2022-08-09 DIAGNOSIS — R6881 Early satiety: Secondary | ICD-10-CM | POA: Diagnosis not present

## 2022-08-09 DIAGNOSIS — K5909 Other constipation: Secondary | ICD-10-CM | POA: Diagnosis not present

## 2022-08-09 DIAGNOSIS — I429 Cardiomyopathy, unspecified: Secondary | ICD-10-CM | POA: Diagnosis not present

## 2022-08-09 DIAGNOSIS — Z79899 Other long term (current) drug therapy: Secondary | ICD-10-CM | POA: Diagnosis not present

## 2022-08-09 DIAGNOSIS — M898X9 Other specified disorders of bone, unspecified site: Secondary | ICD-10-CM | POA: Diagnosis not present

## 2022-08-09 NOTE — Assessment & Plan Note (Signed)
This is resolving I recommend the patient to continue on laxatives

## 2022-08-09 NOTE — Assessment & Plan Note (Signed)
Overall, she tolerated chemotherapy well except for bone aches that resolved with oxycodone She did not feel that her ascites is returning She had multiple loose stools which I suspect is due to improvement of bowel habits with resolution of carcinomatosis I will see her again next week for further follow-up We discussed the timing of CT imaging and surgery in the future

## 2022-08-09 NOTE — Progress Notes (Signed)
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   All questions were answered. The patient knows to call the clinic with any problems, questions or concerns. The total time spent in the appointment was 20 minutes encounter with patients including review of chart and various tests results, discussions about plan of care and coordination of care plan   Heath Lark, MD 08/09/2022 11:34 AM  INTERVAL HISTORY: Please see below for problem oriented charting. she returns for treatment follow-up after recent chemotherapy She have lost 10 pounds likely due to resolution of ascites She had frequent small bowel movement No recent nausea She is able to tolerate oral intake without vomiting She has bone aches last week that resolved with low-dose oxycodone  REVIEW OF SYSTEMS:   Constitutional: Denies fevers, chills or abnormal weight loss Eyes: Denies blurriness of vision Ears, nose, mouth, throat, and face: Denies mucositis or sore throat Respiratory: Denies cough, dyspnea or wheezes Cardiovascular: Denies palpitation, chest discomfort or lower extremity swelling Skin: Denies abnormal skin rashes Lymphatics: Denies new  lymphadenopathy or easy bruising Neurological:Denies numbness, tingling or new weaknesses Behavioral/Psych: Mood is stable, no new changes  All other systems were reviewed with the patient and are negative.  I have reviewed the past medical history, past surgical history, social history and family history with the patient and they are unchanged from previous note.  ALLERGIES:  has No Known Allergies.  MEDICATIONS:  Current Outpatient Medications  Medication Sig Dispense Refill   oxyCODONE (OXY IR/ROXICODONE) 5 MG immediate release tablet Take 1 tablet (5 mg total) by mouth every 6 (six) hours as needed for severe pain. 30 tablet 0   acetaminophen (TYLENOL) 500 MG tablet Take 1,000 mg by mouth every 6 (six) hours as needed for moderate pain.     aspirin EC 81 MG tablet Take 1 tablet (81 mg total) by mouth daily. Swallow whole. 90 tablet 3   Cholecalciferol (DIALYVITE VITAMIN D 5000) 125 MCG (5000 UT) capsule Take 5,000 Units by mouth daily.     dexamethasone (DECADRON) 4 MG tablet Take 2 tabs at the night before and 2 tab the morning of chemotherapy, every 3 weeks, by mouth x 6 cycles 36 tablet 6   hydrOXYzine (VISTARIL) 25 MG capsule Take 1 capsule (25 mg total) by mouth 3 (three) times daily as needed. 30 capsule 2   ibuprofen (ADVIL) 200 MG tablet Take 400 mg by mouth every 6 (six) hours as needed for moderate pain.     levothyroxine (SYNTHROID) 125 MCG tablet Take 125 mcg by mouth every morning.     lidocaine-prilocaine (EMLA) cream Apply to affected area once 30 g 3   ondansetron (ZOFRAN) 8 MG tablet Take 1 tablet (8 mg total) by mouth every 8 (eight) hours as  needed for nausea or vomiting. Start on the third day after chemotherapy. 30 tablet 1   pantoprazole (PROTONIX) 40 MG tablet Take 40 mg by mouth daily.     prochlorperazine (COMPAZINE) 10 MG tablet Take 1 tablet (10 mg total) by mouth every 6 (six) hours as needed for nausea or vomiting. 30 tablet 1   vitamin B-12 (CYANOCOBALAMIN)  500 MCG tablet Take 500 mcg by mouth daily.     No current facility-administered medications for this visit.    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 Small left pleural effusion  2. 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[place])   07/23/2022 Cancer Staging   Staging form: Ovary, Fallopian Tube, and Primary Peritoneal Carcinoma, AJCC 8th Edition - Clinical stage from 07/23/2022: FIGO Stage IIIC (cT3c, cN0, cM0) - Signed by Heath Lark, MD on 07/23/2022 Stage prefix: Initial diagnosis   07/23/2022 Pathology Results   FINAL MICROSCOPIC DIAGNOSIS:  - Malignant cells present  - See comment   SPECIMEN ADEQUACY:  Satisfactory for evaluation   DIAGNOSTIC COMMENTS:  Immunohistochemical stains show that the tumor cells are positive for ER, PAX8, p16 and p53 (clonal overexpression pattern), consistent with a high-grade serous carcinoma.  Immunostain for WT1 is also diffusely positive in the tumor cells, most consistent with an ovarian primary.  Dr. Arby Barrette reviewed the case and concurs with the above diagnosis.    07/30/2022 Procedure   Successful right chest port placement via the right internal jugular vein. The port is ready for immediate use.   08/02/2022 -  Chemotherapy   Patient is on Treatment Plan : OVARIAN Carboplatin (AUC 6) + Paclitaxel (175) q21d X 6 Cycles     08/03/2022 Procedure   Successful ultrasound-guided paracentesis yielding 4.1 liters of peritoneal fluid.   08/09/2022 Tumor Marker   Patient's tumor was tested for the following markers: CA-125. Results of the tumor marker test revealed 452.     PHYSICAL EXAMINATION: ECOG PERFORMANCE STATUS: 1 - Symptomatic but completely ambulatory  Vitals:   08/09/22 1057  BP: 119/63  Pulse: (!) 119  Resp: 18  Temp: (!) 97.4 F (36.3 C)  SpO2: 98%   Filed Weights    08/09/22 1057  Weight: 171 lb (77.6 kg)    GENERAL:alert, no distress and comfortable NEURO: alert & oriented x 3 with fluent speech, no focal motor/sensory deficits  LABORATORY DATA:  I have reviewed the data as listed    Component Value Date/Time   NA 134 (L) 07/30/2022 0928   K 4.4 07/30/2022 0928   CL 101 07/30/2022 0928   CO2 26 07/30/2022 0928   GLUCOSE 103 (H) 07/30/2022 0928   BUN 21 (H) 07/30/2022 0928   CREATININE 1.00 07/30/2022 0928   CALCIUM 8.8 (L) 07/30/2022 0928   PROT 7.7 07/30/2022 0928   ALBUMIN 3.1 (L) 07/30/2022 0928   AST 27 07/30/2022 0928   ALT 14 07/30/2022 0928   ALKPHOS 54 07/30/2022 0928   BILITOT 0.3 07/30/2022 0928   GFRNONAA >60 07/30/2022 0928    No results found for: "SPEP", "UPEP"  Lab Results  Component Value Date   WBC 9.0 07/30/2022   NEUTROABS 5.9 07/30/2022   HGB 11.3 (L) 07/30/2022   HCT 34.0 (L) 07/30/2022   MCV 84.4 07/30/2022   PLT 694 (H) 07/30/2022      Chemistry      Component Value Date/Time  NA 134 (L) 07/30/2022 0928   K 4.4 07/30/2022 0928   CL 101 07/30/2022 0928   CO2 26 07/30/2022 0928   BUN 21 (H) 07/30/2022 0928   CREATININE 1.00 07/30/2022 0928      Component Value Date/Time   CALCIUM 8.8 (L) 07/30/2022 0928   ALKPHOS 54 07/30/2022 0928   AST 27 07/30/2022 0928   ALT 14 07/30/2022 0928   BILITOT 0.3 07/30/2022 3716

## 2022-08-12 ENCOUNTER — Ambulatory Visit (HOSPITAL_COMMUNITY): Payer: BC Managed Care – PPO | Attending: Internal Medicine

## 2022-08-12 DIAGNOSIS — R0600 Dyspnea, unspecified: Secondary | ICD-10-CM | POA: Insufficient documentation

## 2022-08-12 DIAGNOSIS — I2510 Atherosclerotic heart disease of native coronary artery without angina pectoris: Secondary | ICD-10-CM | POA: Insufficient documentation

## 2022-08-12 LAB — ECHOCARDIOGRAM COMPLETE
Area-P 1/2: 5.62 cm2
S' Lateral: 3.9 cm

## 2022-08-13 ENCOUNTER — Encounter: Payer: Self-pay | Admitting: Gynecologic Oncology

## 2022-08-13 ENCOUNTER — Telehealth: Payer: Self-pay

## 2022-08-13 NOTE — Telephone Encounter (Signed)
Returned her call. She is having cramping and pain with Miralax for the last 2 days. She does not want to take the Miralax due to pain/cramping. Denies constipation. She is having daily soft stools. Instructed to take Senokot 2 tabs up to TID and to start today. Next appt in the office 10/23. She will discuss then,

## 2022-08-16 ENCOUNTER — Other Ambulatory Visit: Payer: Self-pay

## 2022-08-16 ENCOUNTER — Inpatient Hospital Stay (HOSPITAL_BASED_OUTPATIENT_CLINIC_OR_DEPARTMENT_OTHER): Payer: BC Managed Care – PPO | Admitting: Hematology and Oncology

## 2022-08-16 ENCOUNTER — Encounter: Payer: Self-pay | Admitting: Hematology and Oncology

## 2022-08-16 VITALS — BP 117/73 | HR 117 | Temp 98.0°F | Resp 1 | Ht 63.0 in | Wt 168.4 lb

## 2022-08-16 DIAGNOSIS — R180 Malignant ascites: Secondary | ICD-10-CM | POA: Diagnosis not present

## 2022-08-16 DIAGNOSIS — R140 Abdominal distension (gaseous): Secondary | ICD-10-CM | POA: Diagnosis not present

## 2022-08-16 DIAGNOSIS — Z9081 Acquired absence of spleen: Secondary | ICD-10-CM | POA: Diagnosis not present

## 2022-08-16 DIAGNOSIS — K5909 Other constipation: Secondary | ICD-10-CM | POA: Diagnosis not present

## 2022-08-16 DIAGNOSIS — C482 Malignant neoplasm of peritoneum, unspecified: Secondary | ICD-10-CM | POA: Diagnosis not present

## 2022-08-16 DIAGNOSIS — I429 Cardiomyopathy, unspecified: Secondary | ICD-10-CM | POA: Diagnosis not present

## 2022-08-16 DIAGNOSIS — R6881 Early satiety: Secondary | ICD-10-CM | POA: Diagnosis not present

## 2022-08-16 DIAGNOSIS — K219 Gastro-esophageal reflux disease without esophagitis: Secondary | ICD-10-CM | POA: Diagnosis not present

## 2022-08-16 DIAGNOSIS — Z79899 Other long term (current) drug therapy: Secondary | ICD-10-CM | POA: Diagnosis not present

## 2022-08-16 DIAGNOSIS — M898X9 Other specified disorders of bone, unspecified site: Secondary | ICD-10-CM | POA: Diagnosis not present

## 2022-08-16 DIAGNOSIS — Z79890 Hormone replacement therapy (postmenopausal): Secondary | ICD-10-CM | POA: Diagnosis not present

## 2022-08-16 DIAGNOSIS — Z5111 Encounter for antineoplastic chemotherapy: Secondary | ICD-10-CM | POA: Diagnosis not present

## 2022-08-16 DIAGNOSIS — J90 Pleural effusion, not elsewhere classified: Secondary | ICD-10-CM | POA: Diagnosis not present

## 2022-08-16 NOTE — Progress Notes (Signed)
[place] OFFICE PROGRESS NOTE  Patient Care Team: Michael Boston, MD as PCP - General (Internal Medicine) Early Osmond, MD as PCP - Cardiology (Cardiology) Juanita Craver, MD as Consulting Physician (Gastroenterology)  ASSESSMENT & PLAN:  Primary peritoneal adenocarcinoma [facility]) Clinically, she is resting and into treatment Her abdominal distention is improving I will see her again next week prior to cycle 2 of therapy I plan to repeat imaging study after cycle 3 of chemotherapy  Malignant ascites This is resolving She does not need repeat paracentesis  No orders of the defined types were placed in this encounter.   All questions were answered. The patient knows to call the clinic with any problems, questions or concerns. The total time spent in the appointment was 25 minutes encounter with patients including review of chart and various tests results, discussions about plan of care and coordination of care plan   Heath Lark, MD 08/16/2022 12:11 PM  INTERVAL HISTORY: Please see below for problem oriented charting. she returns for treatment follow-up  She felt better Her abdominal distention is improving She is having daily bowel movement She denies chest pain or shortness of breath  REVIEW OF SYSTEMS:   Constitutional: Denies fevers, chills or abnormal weight loss Eyes: Denies blurriness of vision Ears, nose, mouth, throat, and face: Denies mucositis or sore throat Respiratory: Denies cough, dyspnea or wheezes Cardiovascular: Denies palpitation, chest discomfort or lower extremity swelling Skin: Denies abnormal skin rashes Lymphatics: Denies new lymphadenopathy or easy bruising Neurological:Denies numbness, tingling or new weaknesses Behavioral/Psych: Mood is stable, no new changes  All other systems were reviewed with the patient and are negative.  I have reviewed the past medical history, past surgical history, social history and family history with  the patient and they are unchanged from previous note.  ALLERGIES:  has No Known Allergies.  MEDICATIONS:  Current Outpatient Medications  Medication Sig Dispense Refill   oxyCODONE (OXY IR/ROXICODONE) 5 MG immediate release tablet Take 1 tablet (5 mg total) by mouth every 6 (six) hours as needed for severe pain. 30 tablet 0   acetaminophen (TYLENOL) 500 MG tablet Take 1,000 mg by mouth every 6 (six) hours as needed for moderate pain.     aspirin EC 81 MG tablet Take 1 tablet (81 mg total) by mouth daily. Swallow whole. 90 tablet 3   Cholecalciferol (DIALYVITE VITAMIN D 5000) 125 MCG (5000 UT) capsule Take 5,000 Units by mouth daily.     dexamethasone (DECADRON) 4 MG tablet Take 2 tabs at the night before and 2 tab the morning of chemotherapy, every 3 weeks, by mouth x 6 cycles 36 tablet 6   hydrOXYzine (VISTARIL) 25 MG capsule Take 1 capsule (25 mg total) by mouth 3 (three) times daily as needed. 30 capsule 2   ibuprofen (ADVIL) 200 MG tablet Take 400 mg by mouth every 6 (six) hours as needed for moderate pain.     levothyroxine (SYNTHROID) 125 MCG tablet Take 125 mcg by mouth every morning.     lidocaine-prilocaine (EMLA) cream Apply to affected area once 30 g 3   ondansetron (ZOFRAN) 8 MG tablet Take 1 tablet (8 mg total) by mouth every 8 (eight) hours as needed for nausea or vomiting. Start on the third day after chemotherapy. 30 tablet 1   pantoprazole (PROTONIX) 40 MG tablet Take 40 mg by mouth daily.     prochlorperazine (COMPAZINE) 10 MG tablet Take 1 tablet (10 mg total) by mouth every 6 (six) hours  as needed for nausea or vomiting. 30 tablet 1   vitamin B-12 (CYANOCOBALAMIN) 500 MCG tablet Take 500 mcg by mouth daily.     No current facility-administered medications for this visit.    SUMMARY OF ONCOLOGIC HISTORY: Oncology History  Primary peritoneal adenocarcinoma ([place])  07/19/2022 Imaging   1. Small left pleural effusion  2. Extensive peritoneal carcinomatosis with moderate  ascites    07/23/2022 Initial Diagnosis   Primary peritoneal adenocarcinoma ([place])   07/23/2022 Cancer Staging   Staging form: Ovary, Fallopian Tube, and Primary Peritoneal Carcinoma, AJCC 8th Edition - Clinical stage from 07/23/2022: FIGO Stage IIIC (cT3c, cN0, cM0) - Signed by Heath Lark, MD on 07/23/2022 Stage prefix: Initial diagnosis   07/23/2022 Pathology Results   FINAL MICROSCOPIC DIAGNOSIS:  - Malignant cells present  - See comment   SPECIMEN ADEQUACY:  Satisfactory for evaluation   DIAGNOSTIC COMMENTS:  Immunohistochemical stains show that the tumor cells are positive for ER, PAX8, p16 and p53 (clonal overexpression pattern), consistent with a high-grade serous carcinoma.  Immunostain for WT1 is also diffusely positive in the tumor cells, most consistent with an ovarian primary.  Dr. Arby Barrette reviewed the case and concurs with the above diagnosis.    07/30/2022 Procedure   Successful right chest port placement via the right internal jugular vein. The port is ready for immediate use.   08/02/2022 -  Chemotherapy   Patient is on Treatment Plan : OVARIAN Carboplatin (AUC 6) + Paclitaxel (175) q21d X 6 Cycles     08/03/2022 Procedure   Successful ultrasound-guided paracentesis yielding 4.1 liters of peritoneal fluid.   08/09/2022 Tumor Marker   Patient's tumor was tested for the following markers: CA-125. Results of the tumor marker test revealed 452.     PHYSICAL EXAMINATION: ECOG PERFORMANCE STATUS: 1 - Symptomatic but completely ambulatory  Vitals:   08/16/22 1059  BP: 117/73  Pulse: (!) 117  Resp: (!) 1  Temp: 98 F (36.7 C)  SpO2: 99%   Filed Weights   08/16/22 1059  Weight: 168 lb 6.4 oz (76.4 kg)    GENERAL:alert, no distress and comfortable SKIN: skin color, texture, turgor are normal, no rashes or significant lesions EYES: normal, Conjunctiva are pink and non-injected, sclera clear OROPHARYNX:no exudate, no erythema and lips, buccal mucosa, and tongue  normal  NECK: supple, thyroid normal size, non-tender, without nodularity LYMPH:  no palpable lymphadenopathy in the cervical, axillary or inguinal LUNGS: clear to auscultation and percussion with normal breathing effort HEART: regular rate & rhythm and no murmurs and no lower extremity edema ABDOMEN:abdomen soft, mildly distended, probable carcinomatosis Musculoskeletal:no cyanosis of digits and no clubbing  NEURO: alert & oriented x 3 with fluent speech, no focal motor/sensory deficits  LABORATORY DATA:  I have reviewed the data as listed    Component Value Date/Time   NA 134 (L) 07/30/2022 0928   K 4.4 07/30/2022 0928   CL 101 07/30/2022 0928   CO2 26 07/30/2022 0928   GLUCOSE 103 (H) 07/30/2022 0928   BUN 21 (H) 07/30/2022 0928   CREATININE 1.00 07/30/2022 0928   CALCIUM 8.8 (L) 07/30/2022 0928   PROT 7.7 07/30/2022 0928   ALBUMIN 3.1 (L) 07/30/2022 0928   AST 27 07/30/2022 0928   ALT 14 07/30/2022 0928   ALKPHOS 54 07/30/2022 0928   BILITOT 0.3 07/30/2022 0928   GFRNONAA >60 07/30/2022 0928    No results found for: "SPEP", "UPEP"  Lab Results  Component Value Date   WBC 9.0 07/30/2022  NEUTROABS 5.9 07/30/2022   HGB 11.3 (L) 07/30/2022   HCT 34.0 (L) 07/30/2022   MCV 84.4 07/30/2022   PLT 694 (H) 07/30/2022      Chemistry      Component Value Date/Time   NA 134 (L) 07/30/2022 0928   K 4.4 07/30/2022 0928   CL 101 07/30/2022 0928   CO2 26 07/30/2022 0928   BUN 21 (H) 07/30/2022 0928   CREATININE 1.00 07/30/2022 0928      Component Value Date/Time   CALCIUM 8.8 (L) 07/30/2022 0928   ALKPHOS 54 07/30/2022 0928   AST 27 07/30/2022 0928   ALT 14 07/30/2022 0928   BILITOT 0.3 07/30/2022 0928       RADIOGRAPHIC STUDIES: I have personally reviewed the radiological images as listed and agreed with the findings in the report. ECHOCARDIOGRAM COMPLETE  Result Date: 08/12/2022    ECHOCARDIOGRAM REPORT   Patient Name:   FOREST PRUDEN Date of Exam:  08/12/2022 Medical Rec #:  [id number]       Height:       63.0 in Accession #:    [id number]      Weight:       171.0 lb Date of Birth:  Feb 27, 1966        BSA:          1.809 m Patient Age:    8 years        BP:           119/63 mmHg Patient Gender: F               HR:           107 bpm. Exam Location:  [facility] Procedure: 2D Echo, Cardiac Doppler and Color Doppler Indications:    R06.00 Dyspnea  History:        Patient has prior history of Echocardiogram examinations, most                 recent 03/29/2006. Coronary artery calcification seen on CAT scan.                 Breast cancer. Hodgkin's disease. Thyroid disease.  Sonographer:    Diamond Nickel RCS Sonographer#2:  Marygrace Drought RCS Referring Phys: [id number] Early Osmond  Sonographer Comments: Image acquisition challenging due to breast implants. IMPRESSIONS  1. Left ventricular ejection fraction, by estimation, is 40 to 45%. The left ventricle has mildly decreased function. The left ventricle demonstrates global hypokinesis. Left ventricular diastolic parameters are indeterminate.  2. Right ventricular systolic function is normal. The right ventricular size is normal.  3. The mitral valve is normal in structure. Mild mitral valve regurgitation. No evidence of mitral stenosis.  4. The aortic valve is normal in structure. Aortic valve regurgitation is mild to moderate. Aortic valve sclerosis/calcification is present, without any evidence of aortic stenosis.  5. The inferior vena cava is normal in size with greater than 50% respiratory variability, suggesting right atrial pressure of 3 mmHg. FINDINGS  Left Ventricle: Left ventricular ejection fraction, by estimation, is 40 to 45%. The left ventricle has mildly decreased function. The left ventricle demonstrates global hypokinesis. The left ventricular internal cavity size was normal in size. There is  no left ventricular hypertrophy. Left ventricular diastolic parameters are indeterminate. Right Ventricle:  The right ventricular size is normal. No increase in right ventricular wall thickness. Right ventricular systolic function is normal. Left Atrium: Left atrial size was normal in size. Right Atrium: Right atrial size  was normal in size. Pericardium: There is no evidence of pericardial effusion. Mitral Valve: The mitral valve is normal in structure. Mild mitral annular calcification. Mild mitral valve regurgitation. No evidence of mitral valve stenosis. Tricuspid Valve: The tricuspid valve is normal in structure. Tricuspid valve regurgitation is mild . No evidence of tricuspid stenosis. Aortic Valve: The aortic valve is normal in structure. There is mild to moderate aortic valve annular calcification. Aortic valve regurgitation is mild to moderate. Aortic valve sclerosis/calcification is present, without any evidence of aortic stenosis. Pulmonic Valve: The pulmonic valve was not well visualized. Pulmonic valve regurgitation is trivial. No evidence of pulmonic stenosis. Aorta: The aortic root is normal in size and structure. Venous: The inferior vena cava is normal in size with greater than 50% respiratory variability, suggesting right atrial pressure of 3 mmHg. IAS/Shunts: No atrial level shunt detected by color flow Doppler.  LEFT VENTRICLE PLAX 2D LVIDd:         4.60 cm   Diastology LVIDs:         3.90 cm   LV e' medial:    5.77 cm/s LV PW:         0.80 cm   LV E/e' medial:  12.5 LV IVS:        0.80 cm   LV e' lateral:   5.22 cm/s LVOT diam:     2.10 cm   LV E/e' lateral: 13.9 LV SV:         43 LV SV Index:   24 LVOT Area:     3.46 cm  RIGHT VENTRICLE RV S prime:     10.90 cm/s TAPSE (M-mode): 1.8 cm LEFT ATRIUM             Index LA Vol (A2C):   22.6 ml 12.49 ml/m LA Vol (A4C):   20.5 ml 11.33 ml/m LA Biplane Vol: 22.7 ml 12.55 ml/m  AORTIC VALVE LVOT Vmax:   80.70 cm/s LVOT Vmean:  54.000 cm/s LVOT VTI:    0.123 m  AORTA Ao Root diam: 3.10 cm MITRAL VALVE MV Area (PHT): 5.62 cm    SHUNTS MV Decel Time: 135  msec    Systemic VTI:  0.12 m MV E velocity: 72.30 cm/s  Systemic Diam: 2.10 cm MV A velocity: 94.30 cm/s MV E/A ratio:  0.77 Kardie Tobb DO Electronically signed by Berniece Salines DO Signature Date/Time: 08/12/2022/2:40:59 PM    Final    US Paracentesis  Result Date: 08/03/2022 INDICATION: History of peritoneal adenocarcinoma with recurrent malignant ascites. Request received for therapeutic paracentesis with a max of 5 L. EXAM: ULTRASOUND GUIDED THERAPEUTIC RIGHT LOWER QUADRANT PARACENTESIS MEDICATIONS: 10 mL 1 % lidocaine COMPLICATIONS: None immediate. PROCEDURE: Informed written consent was obtained from the patient after a discussion of the risks, benefits and alternatives to treatment. A timeout was performed prior to the initiation of the procedure. Initial ultrasound scanning demonstrates a large amount of ascites within the right lower abdominal quadrant. The right lower abdomen was prepped and draped in the usual sterile fashion. 1% lidocaine was used for local anesthesia. Following this, a 19 gauge, 10-cm, Yueh catheter was introduced. An ultrasound image was saved for documentation purposes. The paracentesis was performed. While draining fluid stopped but ultrasound imaging showed moderate amount ascites. The catheter was removed and reinserted using 19 gauge, 15 cm, Yueh by Rowe Robert, PA. The remaining peritoneal fluid was drained. The catheter was removed and a dressing was applied. The patient tolerated the procedure well without immediate  post procedural complication. FINDINGS: A total of approximately 4.1 L of clear, yellow fluid was removed. IMPRESSION: Successful ultrasound-guided paracentesis yielding 4.1 liters of peritoneal fluid. Read by: Narda Rutherford, AGNP-BC Electronically Signed   By: Aletta Edouard M.D.   On: 08/03/2022 12:46   IR IMAGING GUIDED PORT INSERTION  Result Date: 07/29/2022 INDICATION: Chemotherapy access EXAM: Port placement using ultrasound and fluoroscopic guidance  MEDICATIONS: Per EMR ANESTHESIA/SEDATION: Moderate (conscious) sedation was employed during this procedure. A total of Versed 3 mg and Fentanyl 100 mcg was administered intravenously. Moderate Sedation Time: 34 minutes. The patient's level of consciousness and vital signs were monitored continuously by radiology nursing throughout the procedure under my direct supervision. FLUOROSCOPY TIME:  Fluoroscopy Time: 0.2 minutes (1 mGy) COMPLICATIONS: None immediate. PROCEDURE: Informed written consent was obtained from the patient after a thorough discussion of the procedural risks, benefits and alternatives. All questions were addressed. Maximal Sterile Barrier Technique was utilized including caps, mask, sterile gowns, sterile gloves, sterile drape, hand hygiene and skin antiseptic. A timeout was performed prior to the initiation of the procedure. The patient was placed supine on the exam table. The right neck and chest was prepped and draped in the standard sterile fashion. A preliminary ultrasound of the right neck was performed and demonstrates a patent right internal jugular vein. A permanent ultrasound image was stored in the electronic medical record. The overlying skin was anesthetized with 1% Lidocaine. Using ultrasound guidance, access was obtained into the right internal jugular vein using a 21 gauge micropuncture set. A wire was advanced into the SVC, a short incision was made at the puncture site, and serial dilatation performed. Next, in an ipsilateral infraclavicular location, an incision was made at the site of the subcutaneous reservoir. Blunt dissection was used to open a pocket to contain the reservoir. A subcutaneous tunnel was then created from the port site to the puncture site. A(n) 8 Fr single lumen catheter was advanced through the tunnel. The catheter was attached to the port and this was placed in the subcutaneous pocket. Under fluoroscopic guidance, a peel away sheath was placed, and the  catheter was trimmed to the appropriate length and was advanced into the central veins. The catheter length is 19 cm. The tip of the catheter lies near the superior cavoatrial junction. The port flushes and aspirates appropriately. The port was flushed and locked with heparinized saline. The port pocket was closed in 2 layers using 3-0 and 4-0 Vicryl/absorbable suture. Dermabond was also applied to both incisions. The patient tolerated the procedure well and was transferred to recovery in stable condition. IMPRESSION: Successful right chest port placement via the right internal jugular vein. The port is ready for immediate use. Electronically Signed   By: Albin Felling M.D.   On: 07/29/2022 15:23   IR Paracentesis  Result Date: 07/23/2022 INDICATION: Malignant ascites EXAM: ULTRASOUND GUIDED diagnostic and therapeutic PARACENTESIS MEDICATIONS: 7 cc 1% lidocaine COMPLICATIONS: None immediate. PROCEDURE: Informed written consent was obtained from the patient after a discussion of the risks, benefits and alternatives to treatment. A timeout was performed prior to the initiation of the procedure. Initial ultrasound scanning demonstrates a large amount of ascites within the right lower abdominal quadrant. The right lower abdomen was prepped and draped in the usual sterile fashion. 1% lidocaine was used for local anesthesia. Following this, a 19 gauge, 7-cm, Yueh catheter was introduced. An ultrasound image was saved for documentation purposes. The paracentesis was performed. The catheter was removed and a dressing was  applied. The patient tolerated the procedure well without immediate post procedural complication. FINDINGS: A total of approximately 4.2 L of yellow fluid was removed. Samples were sent to the laboratory as requested by the clinical team. IMPRESSION: Successful ultrasound-guided paracentesis yielding 4.2 liters of peritoneal fluid. Read by: Reatha Armour, PA-C Electronically Signed   By: Corrie Mckusick  D.O.   On: 07/23/2022 16:50

## 2022-08-16 NOTE — Assessment & Plan Note (Signed)
Clinically, she is resting and into treatment Her abdominal distention is improving I will see her again next week prior to cycle 2 of therapy I plan to repeat imaging study after cycle 3 of chemotherapy

## 2022-08-16 NOTE — Assessment & Plan Note (Signed)
This is resolving She does not need repeat paracentesis

## 2022-08-17 ENCOUNTER — Telehealth: Payer: Self-pay | Admitting: Internal Medicine

## 2022-08-17 ENCOUNTER — Encounter: Payer: Self-pay | Admitting: Hematology and Oncology

## 2022-08-17 NOTE — Telephone Encounter (Signed)
Patient is returning call to discuss echo results. °

## 2022-08-17 NOTE — Telephone Encounter (Signed)
Spoke with the patient.  She voices understanding of echo results and agreed to an appointment with Dr. Ali Lowe 09/02/22.  I forwarded results to Dr. Berline Lopes and Dr. Alvy Bimler per patient request.  She is currently undergoing treatment for peritoneal adenocarcinoma.

## 2022-08-20 MED FILL — Fosaprepitant Dimeglumine For IV Infusion 150 MG (Base Eq): INTRAVENOUS | Qty: 5 | Status: AC

## 2022-08-20 MED FILL — Dexamethasone Sodium Phosphate Inj 100 MG/10ML: INTRAMUSCULAR | Qty: 1 | Status: AC

## 2022-08-23 ENCOUNTER — Inpatient Hospital Stay: Payer: BC Managed Care – PPO

## 2022-08-23 ENCOUNTER — Inpatient Hospital Stay (HOSPITAL_BASED_OUTPATIENT_CLINIC_OR_DEPARTMENT_OTHER): Payer: BC Managed Care – PPO | Admitting: Hematology and Oncology

## 2022-08-23 ENCOUNTER — Other Ambulatory Visit: Payer: Self-pay

## 2022-08-23 ENCOUNTER — Encounter: Payer: Self-pay | Admitting: Hematology and Oncology

## 2022-08-23 VITALS — BP 128/73 | HR 116 | Temp 97.8°F | Resp 18 | Ht 63.0 in | Wt 165.0 lb

## 2022-08-23 VITALS — BP 129/83 | HR 107 | Resp 17

## 2022-08-23 DIAGNOSIS — M898X9 Other specified disorders of bone, unspecified site: Secondary | ICD-10-CM | POA: Diagnosis not present

## 2022-08-23 DIAGNOSIS — C482 Malignant neoplasm of peritoneum, unspecified: Secondary | ICD-10-CM

## 2022-08-23 DIAGNOSIS — Z79890 Hormone replacement therapy (postmenopausal): Secondary | ICD-10-CM | POA: Diagnosis not present

## 2022-08-23 DIAGNOSIS — K5909 Other constipation: Secondary | ICD-10-CM

## 2022-08-23 DIAGNOSIS — R180 Malignant ascites: Secondary | ICD-10-CM | POA: Diagnosis not present

## 2022-08-23 DIAGNOSIS — R6881 Early satiety: Secondary | ICD-10-CM | POA: Diagnosis not present

## 2022-08-23 DIAGNOSIS — J90 Pleural effusion, not elsewhere classified: Secondary | ICD-10-CM | POA: Diagnosis not present

## 2022-08-23 DIAGNOSIS — R140 Abdominal distension (gaseous): Secondary | ICD-10-CM | POA: Diagnosis not present

## 2022-08-23 DIAGNOSIS — Z9081 Acquired absence of spleen: Secondary | ICD-10-CM | POA: Diagnosis not present

## 2022-08-23 DIAGNOSIS — K219 Gastro-esophageal reflux disease without esophagitis: Secondary | ICD-10-CM | POA: Diagnosis not present

## 2022-08-23 DIAGNOSIS — Z5111 Encounter for antineoplastic chemotherapy: Secondary | ICD-10-CM | POA: Diagnosis not present

## 2022-08-23 DIAGNOSIS — I429 Cardiomyopathy, unspecified: Secondary | ICD-10-CM | POA: Diagnosis not present

## 2022-08-23 DIAGNOSIS — Z79899 Other long term (current) drug therapy: Secondary | ICD-10-CM | POA: Diagnosis not present

## 2022-08-23 LAB — CBC WITH DIFFERENTIAL (CANCER CENTER ONLY)
Abs Immature Granulocytes: 0.08 10*3/uL — ABNORMAL HIGH (ref 0.00–0.07)
Basophils Absolute: 0 10*3/uL (ref 0.0–0.1)
Basophils Relative: 0 %
Eosinophils Absolute: 0 10*3/uL (ref 0.0–0.5)
Eosinophils Relative: 0 %
HCT: 32.6 % — ABNORMAL LOW (ref 36.0–46.0)
Hemoglobin: 10.9 g/dL — ABNORMAL LOW (ref 12.0–15.0)
Immature Granulocytes: 1 %
Lymphocytes Relative: 16 %
Lymphs Abs: 1.4 10*3/uL (ref 0.7–4.0)
MCH: 27.9 pg (ref 26.0–34.0)
MCHC: 33.4 g/dL (ref 30.0–36.0)
MCV: 83.4 fL (ref 80.0–100.0)
Monocytes Absolute: 0.2 10*3/uL (ref 0.1–1.0)
Monocytes Relative: 2 %
Neutro Abs: 7.6 10*3/uL (ref 1.7–7.7)
Neutrophils Relative %: 81 %
Platelet Count: 551 10*3/uL — ABNORMAL HIGH (ref 150–400)
RBC: 3.91 MIL/uL (ref 3.87–5.11)
RDW: 17.1 % — ABNORMAL HIGH (ref 11.5–15.5)
WBC Count: 9.3 10*3/uL (ref 4.0–10.5)
nRBC: 0 % (ref 0.0–0.2)

## 2022-08-23 LAB — CMP (CANCER CENTER ONLY)
ALT: 18 U/L (ref 0–44)
AST: 22 U/L (ref 15–41)
Albumin: 3.8 g/dL (ref 3.5–5.0)
Alkaline Phosphatase: 82 U/L (ref 38–126)
Anion gap: 8 (ref 5–15)
BUN: 23 mg/dL — ABNORMAL HIGH (ref 6–20)
CO2: 26 mmol/L (ref 22–32)
Calcium: 9.5 mg/dL (ref 8.9–10.3)
Chloride: 102 mmol/L (ref 98–111)
Creatinine: 0.92 mg/dL (ref 0.44–1.00)
GFR, Estimated: >60 mL/min (ref >60)
Glucose, Bld: 135 mg/dL — ABNORMAL HIGH (ref 70–99)
Potassium: 4 mmol/L (ref 3.5–5.1)
Sodium: 136 mmol/L (ref 135–145)
Total Bilirubin: 0.2 mg/dL — ABNORMAL LOW (ref 0.3–1.2)
Total Protein: 9.1 g/dL — ABNORMAL HIGH (ref 6.5–8.1)

## 2022-08-23 MED ORDER — SODIUM CHLORIDE 0.9 % IV SOLN
10.0000 mg | Freq: Once | INTRAVENOUS | Status: AC
  Administered 2022-08-23: 10 mg via INTRAVENOUS
  Filled 2022-08-23: qty 10

## 2022-08-23 MED ORDER — HEPARIN SOD (PORK) LOCK FLUSH 100 UNIT/ML IV SOLN
500.0000 [IU] | Freq: Once | INTRAVENOUS | Status: AC | PRN
  Administered 2022-08-23: 500 [IU]

## 2022-08-23 MED ORDER — SODIUM CHLORIDE 0.9% FLUSH
10.0000 mL | Freq: Once | INTRAVENOUS | Status: AC
  Administered 2022-08-23: 10 mL

## 2022-08-23 MED ORDER — SODIUM CHLORIDE 0.9% FLUSH
10.0000 mL | INTRAVENOUS | Status: DC | PRN
  Administered 2022-08-23: 10 mL

## 2022-08-23 MED ORDER — FAMOTIDINE IN NACL 20-0.9 MG/50ML-% IV SOLN
20.0000 mg | Freq: Once | INTRAVENOUS | Status: AC
  Administered 2022-08-23: 20 mg via INTRAVENOUS
  Filled 2022-08-23: qty 50

## 2022-08-23 MED ORDER — CETIRIZINE HCL 10 MG/ML IV SOLN
10.0000 mg | Freq: Once | INTRAVENOUS | Status: AC
  Administered 2022-08-23: 10 mg via INTRAVENOUS

## 2022-08-23 MED ORDER — SODIUM CHLORIDE 0.9 % IV SOLN
640.0000 mg | Freq: Once | INTRAVENOUS | Status: AC
  Administered 2022-08-23: 640 mg via INTRAVENOUS
  Filled 2022-08-23: qty 64

## 2022-08-23 MED ORDER — PALONOSETRON HCL INJECTION 0.25 MG/5ML
0.2500 mg | Freq: Once | INTRAVENOUS | Status: AC
  Administered 2022-08-23: 0.25 mg via INTRAVENOUS
  Filled 2022-08-23: qty 5

## 2022-08-23 MED ORDER — SODIUM CHLORIDE 0.9 % IV SOLN
150.0000 mg | Freq: Once | INTRAVENOUS | Status: AC
  Administered 2022-08-23: 150 mg via INTRAVENOUS
  Filled 2022-08-23: qty 150

## 2022-08-23 MED ORDER — SODIUM CHLORIDE 0.9 % IV SOLN: Freq: Once | INTRAVENOUS | Status: AC

## 2022-08-23 MED ORDER — SODIUM CHLORIDE 0.9 % IV SOLN
175.0000 mg/m2 | Freq: Once | INTRAVENOUS | Status: AC
  Administered 2022-08-23: 318 mg via INTRAVENOUS
  Filled 2022-08-23: qty 53

## 2022-08-23 NOTE — Assessment & Plan Note (Signed)
Overall, she tolerated treatment well with some expected side effects Her recent weight loss is related to resolution of ascites Clinically, she felt great We will proceed with cycle 2 with minor dose adjustment based on her most current weight I recommend we schedule cycle 3 and 4 with interim imaging study to be done after cycle 3 She will see GYN surgeon after cycle 3 to determine whether she would be ready for interval debulking surgery

## 2022-08-23 NOTE — Assessment & Plan Note (Signed)
This is unlikely caused by recent regimen I recommend the patient to keep her appointment to see cardiologist She has no clinical signs or symptoms of congestive heart failure

## 2022-08-23 NOTE — Assessment & Plan Note (Signed)
We discussed importance of laxatives

## 2022-08-23 NOTE — Assessment & Plan Note (Signed)
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

## 2022-08-23 NOTE — Progress Notes (Signed)
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[place]) Overall, she tolerated treatment well with some expected side effects Her recent weight loss is related to resolution of ascites Clinically, she felt great We will proceed with cycle 2 with minor dose adjustment based on her most current weight I recommend we schedule cycle 3 and 4 with interim imaging study to be done after cycle 3 She will see GYN surgeon after cycle 3 to determine whether she would be ready for interval debulking surgery  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   All questions were answered. The patient knows to call the clinic with any problems, questions or concerns. The total time spent in the appointment was 30 minutes encounter with patients including review of chart and various tests results, discussions about plan of care and coordination of care plan   Heath Lark, MD 08/23/2022 11:09 AM  INTERVAL HISTORY: Please see below for problem oriented charting. she returns for treatment follow-up with her husband She felt great since her last visit She has resolution of ascites Denies recent cough,  chest pain or shortness of breath.  She had recent abnormal echocardiogram Her bony aches and pain has resolved  REVIEW OF SYSTEMS:   Constitutional: Denies fevers, chills or abnormal weight loss Eyes: Denies blurriness of vision Ears, nose, mouth, throat, and face: Denies mucositis or sore throat Respiratory: Denies cough, dyspnea or wheezes Cardiovascular: Denies palpitation, chest discomfort or lower extremity swelling Gastrointestinal:  Denies nausea, heartburn or change in bowel habits Skin: Denies abnormal skin rashes Lymphatics: Denies new lymphadenopathy or easy bruising Neurological:Denies numbness, tingling or new weaknesses Behavioral/Psych: Mood is stable, no new changes  All other systems were reviewed with the patient and are negative.  I have reviewed the past medical history, past surgical history, social history and family history with the patient and they are unchanged from previous note.  ALLERGIES:  has No Known Allergies.  MEDICATIONS:  Current Outpatient Medications  Medication Sig Dispense Refill   oxyCODONE (OXY IR/ROXICODONE) 5 MG immediate release tablet Take 1 tablet (5 mg total) by mouth every 6 (six) hours as needed for severe pain. 30 tablet 0   acetaminophen (TYLENOL) 500 MG tablet Take 1,000 mg by mouth every 6 (six) hours as needed for moderate pain.     aspirin EC 81 MG tablet Take 1 tablet (81 mg total) by mouth daily. Swallow whole. 90 tablet 3   Cholecalciferol (DIALYVITE VITAMIN D 5000) 125 MCG (5000 UT) capsule Take 5,000 Units by mouth daily.     dexamethasone (DECADRON) 4 MG tablet Take 2 tabs at the night before and 2 tab the morning of chemotherapy, every 3 weeks, by mouth x 6 cycles 36 tablet 6   hydrOXYzine (  VISTARIL) 25 MG capsule Take 1 capsule (25 mg total) by mouth 3 (three) times daily as needed. 30 capsule 2   ibuprofen (ADVIL) 200 MG tablet Take 400 mg by mouth every 6 (six) hours as needed for moderate pain.     levothyroxine  (SYNTHROID) 125 MCG tablet Take 125 mcg by mouth every morning.     lidocaine-prilocaine (EMLA) cream Apply to affected area once 30 g 3   ondansetron (ZOFRAN) 8 MG tablet Take 1 tablet (8 mg total) by mouth every 8 (eight) hours as needed for nausea or vomiting. Start on the third day after chemotherapy. 30 tablet 1   pantoprazole (PROTONIX) 40 MG tablet Take 40 mg by mouth daily.     prochlorperazine (COMPAZINE) 10 MG tablet Take 1 tablet (10 mg total) by mouth every 6 (six) hours as needed for nausea or vomiting. 30 tablet 1   vitamin B-12 (CYANOCOBALAMIN) 500 MCG tablet Take 500 mcg by mouth daily.     No current facility-administered medications for this visit.    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 Small left pleural effusion  2. 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[place])   07/23/2022 Cancer Staging   Staging form: Ovary, Fallopian Tube, and Primary Peritoneal Carcinoma, AJCC 8th Edition - Clinical stage from 07/23/2022: FIGO Stage IIIC (cT3c, cN0, cM0) - Signed by Heath Lark, MD on 07/23/2022 Stage prefix: Initial diagnosis   07/23/2022 Pathology Results   FINAL MICROSCOPIC DIAGNOSIS:  - Malignant cells present  - See comment   SPECIMEN ADEQUACY:  Satisfactory for evaluation   DIAGNOSTIC COMMENTS:  Immunohistochemical stains show that the tumor cells are positive for ER, PAX8, p16 and p53 (clonal overexpression pattern), consistent with a high-grade serous carcinoma.  Immunostain for WT1 is also diffusely positive in the tumor cells, most consistent with an ovarian primary.  Dr. Arby Barrette reviewed the case and concurs with the above diagnosis.    07/30/2022 Procedure   Successful right chest port placement via the right internal jugular vein. The port is ready for immediate use.   08/02/2022 -  Chemotherapy    Patient is on Treatment Plan : OVARIAN Carboplatin (AUC 6) + Paclitaxel (175) q21d X 6 Cycles     08/03/2022 Procedure   Successful ultrasound-guided paracentesis yielding 4.1 liters of peritoneal fluid.   08/09/2022 Tumor Marker   Patient's tumor was tested for the following markers: CA-125. Results of the tumor marker test revealed 452.   08/12/2022 Echocardiogram    1. Left ventricular ejection fraction, by estimation, is 40 to 45%. The left ventricle has mildly decreased function. The left ventricle demonstrates global hypokinesis. Left ventricular diastolic parameters are indeterminate.   2. Right ventricular systolic function is normal. The right ventricular size is normal.   3. The mitral valve is normal in structure. Mild mitral valve regurgitation. No evidence of mitral stenosis.   4. The aortic valve is normal in structure. Aortic valve regurgitation is mild to moderate. Aortic valve sclerosis/calcification is present, without any evidence of aortic stenosis.   5. The inferior vena cava is normal in size with greater than 50% respiratory variability, suggesting right atrial pressure of 3 mmHg.      PHYSICAL EXAMINATION: ECOG PERFORMANCE STATUS: 1 - Symptomatic but completely ambulatory  Vitals:   08/23/22 1042  BP: 128/73  Pulse: (!) 116  Resp: 18  Temp: 97.8 F (  36.6 C)  SpO2: 96%   Filed Weights   08/23/22 1042  Weight: 165 lb (74.8 kg)    GENERAL:alert, no distress and comfortable NEURO: alert & oriented x 3 with fluent speech, no focal motor/sensory deficits  LABORATORY DATA:  I have reviewed the data as listed    Component Value Date/Time   NA 136 08/23/2022 1023   K 4.0 08/23/2022 1023   CL 102 08/23/2022 1023   CO2 26 08/23/2022 1023   GLUCOSE 135 (H) 08/23/2022 1023   BUN 23 (H) 08/23/2022 1023   CREATININE 0.92 08/23/2022 1023   CALCIUM 9.5 08/23/2022 1023   PROT 9.1 (H) 08/23/2022 1023   ALBUMIN 3.8 08/23/2022 1023   AST 22 08/23/2022 1023    ALT 18 08/23/2022 1023   ALKPHOS 82 08/23/2022 1023   BILITOT 0.2 (L) 08/23/2022 1023   GFRNONAA >60 08/23/2022 1023    No results found for: "SPEP", "UPEP"  Lab Results  Component Value Date   WBC 9.3 08/23/2022   NEUTROABS 7.6 08/23/2022   HGB 10.9 (L) 08/23/2022   HCT 32.6 (L) 08/23/2022   MCV 83.4 08/23/2022   PLT 551 (H) 08/23/2022      Chemistry      Component Value Date/Time   NA 136 08/23/2022 1023   K 4.0 08/23/2022 1023   CL 102 08/23/2022 1023   CO2 26 08/23/2022 1023   BUN 23 (H) 08/23/2022 1023   CREATININE 0.92 08/23/2022 1023      Component Value Date/Time   CALCIUM 9.5 08/23/2022 1023   ALKPHOS 82 08/23/2022 1023   AST 22 08/23/2022 1023   ALT 18 08/23/2022 1023   BILITOT 0.2 (L) 08/23/2022 1023       RADIOGRAPHIC STUDIES: I have personally reviewed the radiological images as listed and agreed with the findings in the report. ECHOCARDIOGRAM COMPLETE  Result Date: 08/12/2022    ECHOCARDIOGRAM REPORT   Patient Name:   Lauren Chandler Date of Exam: 08/12/2022 Medical Rec #:  [id number]       Height:       63.0 in Accession #:    [id number]      Weight:       171.0 lb Date of Birth:  April 16, 1966        BSA:          1.809 m Patient Age:    56 years        BP:           119/63 mmHg Patient Gender: F               HR:           107 bpm. Exam Location:  [address] Procedure: 2D Echo, Cardiac Doppler and Color Doppler Indications:    R06.00 Dyspnea  History:        Patient has prior history of Echocardiogram examinations, most                 recent 03/29/2006. Coronary artery calcification seen on CAT scan.                 Breast cancer. Hodgkin's disease. Thyroid disease.  Sonographer:    Diamond Nickel RCS Sonographer#2:  Marygrace Drought RCS Referring Phys: [id number] Early Osmond  Sonographer Comments: Image acquisition challenging due to breast implants. IMPRESSIONS  1. Left ventricular ejection fraction, by estimation, is 40 to 45%. The left ventricle has mildly  decreased function. The left ventricle demonstrates global hypokinesis.  Left ventricular diastolic parameters are indeterminate.  2. Right ventricular systolic function is normal. The right ventricular size is normal.  3. The mitral valve is normal in structure. Mild mitral valve regurgitation. No evidence of mitral stenosis.  4. The aortic valve is normal in structure. Aortic valve regurgitation is mild to moderate. Aortic valve sclerosis/calcification is present, without any evidence of aortic stenosis.  5. The inferior vena cava is normal in size with greater than 50% respiratory variability, suggesting right atrial pressure of 3 mmHg. FINDINGS  Left Ventricle: Left ventricular ejection fraction, by estimation, is 40 to 45%. The left ventricle has mildly decreased function. The left ventricle demonstrates global hypokinesis. The left ventricular internal cavity size was normal in size. There is  no left ventricular hypertrophy. Left ventricular diastolic parameters are indeterminate. Right Ventricle: The right ventricular size is normal. No increase in right ventricular wall thickness. Right ventricular systolic function is normal. Left Atrium: Left atrial size was normal in size. Right Atrium: Right atrial size was normal in size. Pericardium: There is no evidence of pericardial effusion. Mitral Valve: The mitral valve is normal in structure. Mild mitral annular calcification. Mild mitral valve regurgitation. No evidence of mitral valve stenosis. Tricuspid Valve: The tricuspid valve is normal in structure. Tricuspid valve regurgitation is mild . No evidence of tricuspid stenosis. Aortic Valve: The aortic valve is normal in structure. There is mild to moderate aortic valve annular calcification. Aortic valve regurgitation is mild to moderate. Aortic valve sclerosis/calcification is present, without any evidence of aortic stenosis. Pulmonic Valve: The pulmonic valve was not well visualized. Pulmonic valve  regurgitation is trivial. No evidence of pulmonic stenosis. Aorta: The aortic root is normal in size and structure. Venous: The inferior vena cava is normal in size with greater than 50% respiratory variability, suggesting right atrial pressure of 3 mmHg. IAS/Shunts: No atrial level shunt detected by color flow Doppler.  LEFT VENTRICLE PLAX 2D LVIDd:         4.60 cm   Diastology LVIDs:         3.90 cm   LV e' medial:    5.77 cm/s LV PW:         0.80 cm   LV E/e' medial:  12.5 LV IVS:        0.80 cm   LV e' lateral:   5.22 cm/s LVOT diam:     2.10 cm   LV E/e' lateral: 13.9 LV SV:         43 LV SV Index:   24 LVOT Area:     3.46 cm  RIGHT VENTRICLE RV S prime:     10.90 cm/s TAPSE (M-mode): 1.8 cm LEFT ATRIUM             Index LA Vol (A2C):   22.6 ml 12.49 ml/m LA Vol (A4C):   20.5 ml 11.33 ml/m LA Biplane Vol: 22.7 ml 12.55 ml/m  AORTIC VALVE LVOT Vmax:   80.70 cm/s LVOT Vmean:  54.000 cm/s LVOT VTI:    0.123 m  AORTA Ao Root diam: 3.10 cm MITRAL VALVE MV Area (PHT): 5.62 cm    SHUNTS MV Decel Time: 135 msec    Systemic VTI:  0.12 m MV E velocity: 72.30 cm/s  Systemic Diam: 2.10 cm MV A velocity: 94.30 cm/s MV E/A ratio:  0.77 Kardie Tobb DO Electronically signed by Berniece Salines DO Signature Date/Time: 08/12/2022/2:40:59 PM    Final    US Paracentesis  Result Date: 08/03/2022 INDICATION: History  of peritoneal adenocarcinoma with recurrent malignant ascites. Request received for therapeutic paracentesis with a max of 5 L. EXAM: ULTRASOUND GUIDED THERAPEUTIC RIGHT LOWER QUADRANT PARACENTESIS MEDICATIONS: 10 mL 1 % lidocaine COMPLICATIONS: None immediate. PROCEDURE: Informed written consent was obtained from the patient after a discussion of the risks, benefits and alternatives to treatment. A timeout was performed prior to the initiation of the procedure. Initial ultrasound scanning demonstrates a large amount of ascites within the right lower abdominal quadrant. The right lower abdomen was prepped and draped  in the usual sterile fashion. 1% lidocaine was used for local anesthesia. Following this, a 19 gauge, 10-cm, Yueh catheter was introduced. An ultrasound image was saved for documentation purposes. The paracentesis was performed. While draining fluid stopped but ultrasound imaging showed moderate amount ascites. The catheter was removed and reinserted using 19 gauge, 15 cm, Yueh by Rowe Robert, PA. The remaining peritoneal fluid was drained. The catheter was removed and a dressing was applied. The patient tolerated the procedure well without immediate post procedural complication. FINDINGS: A total of approximately 4.1 L of clear, yellow fluid was removed. IMPRESSION: Successful ultrasound-guided paracentesis yielding 4.1 liters of peritoneal fluid. Read by: Narda Rutherford, AGNP-BC Electronically Signed   By: Aletta Edouard M.D.   On: 08/03/2022 12:46   IR IMAGING GUIDED PORT INSERTION  Result Date: 07/29/2022 INDICATION: Chemotherapy access EXAM: Port placement using ultrasound and fluoroscopic guidance MEDICATIONS: Per EMR ANESTHESIA/SEDATION: Moderate (conscious) sedation was employed during this procedure. A total of Versed 3 mg and Fentanyl 100 mcg was administered intravenously. Moderate Sedation Time: 34 minutes. The patient's level of consciousness and vital signs were monitored continuously by radiology nursing throughout the procedure under my direct supervision. FLUOROSCOPY TIME:  Fluoroscopy Time: 0.2 minutes (1 mGy) COMPLICATIONS: None immediate. PROCEDURE: Informed written consent was obtained from the patient after a thorough discussion of the procedural risks, benefits and alternatives. All questions were addressed. Maximal Sterile Barrier Technique was utilized including caps, mask, sterile gowns, sterile gloves, sterile drape, hand hygiene and skin antiseptic. A timeout was performed prior to the initiation of the procedure. The patient was placed supine on the exam table. The right neck and  chest was prepped and draped in the standard sterile fashion. A preliminary ultrasound of the right neck was performed and demonstrates a patent right internal jugular vein. A permanent ultrasound image was stored in the electronic medical record. The overlying skin was anesthetized with 1% Lidocaine. Using ultrasound guidance, access was obtained into the right internal jugular vein using a 21 gauge micropuncture set. A wire was advanced into the SVC, a short incision was made at the puncture site, and serial dilatation performed. Next, in an ipsilateral infraclavicular location, an incision was made at the site of the subcutaneous reservoir. Blunt dissection was used to open a pocket to contain the reservoir. A subcutaneous tunnel was then created from the port site to the puncture site. A(n) 8 Fr single lumen catheter was advanced through the tunnel. The catheter was attached to the port and this was placed in the subcutaneous pocket. Under fluoroscopic guidance, a peel away sheath was placed, and the catheter was trimmed to the appropriate length and was advanced into the central veins. The catheter length is 19 cm. The tip of the catheter lies near the superior cavoatrial junction. The port flushes and aspirates appropriately. The port was flushed and locked with heparinized saline. The port pocket was closed in 2 layers using 3-0 and 4-0 Vicryl/absorbable suture. Dermabond was also  applied to both incisions. The patient tolerated the procedure well and was transferred to recovery in stable condition. IMPRESSION: Successful right chest port placement via the right internal jugular vein. The port is ready for immediate use. Electronically Signed   By: Albin Felling M.D.   On: 07/29/2022 15:23

## 2022-08-23 NOTE — Assessment & Plan Note (Signed)
This has resolved She does not need paracentesis

## 2022-08-23 NOTE — Assessment & Plan Note (Signed)
I anticipate she will have minor bony aches and pain after chemo Observe closely She has prescription oxycodone to take as needed

## 2022-08-23 NOTE — Patient Instructions (Addendum)
[facility]  Discharge Instructions: Thank you for choosing [facility] to provide your oncology and hematology care.   If you have a lab appointment with the [facility], please go directly to the [facility] and check in at the registration area.   Wear comfortable clothing and clothing appropriate for easy access to any Portacath or PICC line.   We strive to give you quality time with your provider. You may need to reschedule your appointment if you arrive late (15 or more minutes).  Arriving late affects you and other patients whose appointments are after yours.  Also, if you miss three or more appointments without notifying the office, you may be dismissed from the clinic at the provider's discretion.      For prescription refill requests, have your pharmacy contact our office and allow 72 hours for refills to be completed.    Today you received the following chemotherapy and/or immunotherapy agents: Taxol/Carbo      To help prevent nausea and vomiting after your treatment, we encourage you to take your nausea medication as directed.  BELOW ARE SYMPTOMS THAT SHOULD BE REPORTED IMMEDIATELY: *FEVER GREATER THAN 100.4 F (38 C) OR HIGHER *CHILLS OR SWEATING *NAUSEA AND VOMITING THAT IS NOT CONTROLLED WITH YOUR NAUSEA MEDICATION *UNUSUAL SHORTNESS OF BREATH *UNUSUAL BRUISING OR BLEEDING *URINARY PROBLEMS (pain or burning when urinating, or frequent urination) *BOWEL PROBLEMS (unusual diarrhea, constipation, pain near the anus) TENDERNESS IN MOUTH AND THROAT WITH OR WITHOUT PRESENCE OF ULCERS (sore throat, sores in mouth, or a toothache) UNUSUAL RASH, SWELLING OR PAIN  UNUSUAL VAGINAL DISCHARGE OR ITCHING   Items with * indicate a potential emergency and should be followed up as soon as possible or go to the Emergency Department if any problems should occur.  Please show the CHEMOTHERAPY ALERT CARD or IMMUNOTHERAPY ALERT CARD at check-in  to the Emergency Department and triage nurse.  Should you have questions after your visit or need to cancel or reschedule your appointment, please contact [facility]  Dept: [phone]  and follow the prompts.  Office hours are 8:00 a.m. to 4:30 p.m. Monday - Friday. Please note that voicemails left after 4:00 p.m. may not be returned until the following business day.  We are closed weekends and major holidays. You have access to a nurse at all times for urgent questions. Please call the main number to the clinic Dept: [phone] and follow the prompts.   For any non-urgent questions, you may also contact your provider using MyChart. We now offer e-Visits for anyone 37 and older to request care online for non-urgent symptoms. For details visit [email or web address].   Also download the MyChart app! Go to the app store, search "MyChart", open the app, select [facility], and log in with your MyChart username and password.  Masks are optional in the cancer centers. If you would like for your care team to wear a mask while they are taking care of you, please let them know. You may have one support person who is at least 56 years old accompany you for your appointments. Paclitaxel Injection What is this medication? PACLITAXEL (PAK li TAX el) treats some types of cancer. It works by slowing down the growth of cancer cells. This medicine may be used for other purposes; ask your health care provider or pharmacist if you have questions. COMMON BRAND NAME(S): Onxol, Taxol What should I tell my care team before I take this medication?  They need to know if you have any of these conditions: Heart disease Liver disease Low white blood cell levels An unusual or allergic reaction to paclitaxel, other medications, foods, dyes, or preservatives If you or your partner are pregnant or trying to get pregnant Breast-feeding How should I use this medication? This medication is  injected into a vein. It is given by your care team in a hospital or clinic setting. Talk to your care team about the use of this medication in children. While it may be given to children for selected conditions, precautions do apply. Overdosage: If you think you have taken too much of this medicine contact a poison control center or emergency room at once. NOTE: This medicine is only for you. Do not share this medicine with others. What if I miss a dose? Keep appointments for follow-up doses. It is important not to miss your dose. Call your care team if you are unable to keep an appointment. What may interact with this medication? Do not take this medication with any of the following: Live virus vaccines Other medications may affect the way this medication works. Talk with your care team about all of the medications you take. They may suggest changes to your treatment plan to lower the risk of side effects and to make sure your medications work as intended. This list may not describe all possible interactions. Give your health care provider a list of all the medicines, herbs, non-prescription drugs, or dietary supplements you use. Also tell them if you smoke, drink alcohol, or use illegal drugs. Some items may interact with your medicine. What should I watch for while using this medication? Your condition will be monitored carefully while you are receiving this medication. You may need blood work while taking this medication. This medication may make you feel generally unwell. This is not uncommon as chemotherapy can affect healthy cells as well as cancer cells. Report any side effects. Continue your course of treatment even though you feel ill unless your care team tells you to stop. This medication can cause serious allergic reactions. To reduce the risk, your care team may give you other medications to take before receiving this one. Be sure to follow the directions from your care team. This  medication may increase your risk of getting an infection. Call your care team for advice if you get a fever, chills, sore throat, or other symptoms of a cold or flu. Do not treat yourself. Try to avoid being around people who are sick. This medication may increase your risk to bruise or bleed. Call your care team if you notice any unusual bleeding. Be careful brushing or flossing your teeth or using a toothpick because you may get an infection or bleed more easily. If you have any dental work done, tell your dentist you are receiving this medication. Talk to your care team if you may be pregnant. Serious birth defects can occur if you take this medication during pregnancy. Talk to your care team before breastfeeding. Changes to your treatment plan may be needed. What side effects may I notice from receiving this medication? Side effects that you should report to your care team as soon as possible: Allergic reactions--skin rash, itching, hives, swelling of the face, lips, tongue, or throat Heart rhythm changes--fast or irregular heartbeat, dizziness, feeling faint or lightheaded, chest pain, trouble breathing Increase in blood pressure Infection--fever, chills, cough, sore throat, wounds that don't heal, pain or trouble when passing urine, general feeling of discomfort  or being unwell Low blood pressure--dizziness, feeling faint or lightheaded, blurry vision Low red blood cell level--unusual weakness or fatigue, dizziness, headache, trouble breathing Painful swelling, warmth, or redness of the skin, blisters or sores at the infusion site Pain, tingling, or numbness in the hands or feet Slow heartbeat--dizziness, feeling faint or lightheaded, confusion, trouble breathing, unusual weakness or fatigue Unusual bruising or bleeding Side effects that usually do not require medical attention (report to your care team if they continue or are bothersome): Diarrhea Hair loss Joint pain Loss of  appetite Muscle pain Nausea Vomiting This list may not describe all possible side effects. Call your doctor for medical advice about side effects. You may report side effects to FDA at 1-800-FDA-1088. Where should I keep my medication? This medication is given in a hospital or clinic. It will not be stored at home. NOTE: This sheet is a summary. It may not cover all possible information. If you have questions about this medicine, talk to your doctor, pharmacist, or health care provider.  2023 Elsevier/Gold Standard (2022-02-10 00:00:00) Carboplatin Injection What is this medication? CARBOPLATIN (KAR boe pla tin) treats some types of cancer. It works by slowing down the growth of cancer cells. This medicine may be used for other purposes; ask your health care provider or pharmacist if you have questions. COMMON BRAND NAME(S): Paraplatin What should I tell my care team before I take this medication? They need to know if you have any of these conditions: Blood disorders Hearing problems Kidney disease Recent or ongoing radiation therapy An unusual or allergic reaction to carboplatin, cisplatin, other medications, foods, dyes, or preservatives Pregnant or trying to get pregnant Breast-feeding How should I use this medication? This medication is injected into a vein. It is given by your care team in a hospital or clinic setting. Talk to your care team about the use of this medication in children. Special care may be needed. Overdosage: If you think you have taken too much of this medicine contact a poison control center or emergency room at once. NOTE: This medicine is only for you. Do not share this medicine with others. What if I miss a dose? Keep appointments for follow-up doses. It is important not to miss your dose. Call your care team if you are unable to keep an appointment. What may interact with this medication? Medications for seizures Some antibiotics, such as amikacin,  gentamicin, neomycin, streptomycin, tobramycin Vaccines This list may not describe all possible interactions. Give your health care provider a list of all the medicines, herbs, non-prescription drugs, or dietary supplements you use. Also tell them if you smoke, drink alcohol, or use illegal drugs. Some items may interact with your medicine. What should I watch for while using this medication? Your condition will be monitored carefully while you are receiving this medication. You may need blood work while taking this medication. This medication may make you feel generally unwell. This is not uncommon, as chemotherapy can affect healthy cells as well as cancer cells. Report any side effects. Continue your course of treatment even though you feel ill unless your care team tells you to stop. In some cases, you may be given additional medications to help with side effects. Follow all directions for their use. This medication may increase your risk of getting an infection. Call your care team for advice if you get a fever, chills, sore throat, or other symptoms of a cold or flu. Do not treat yourself. Try to avoid being around people  who are sick. Avoid taking medications that contain aspirin, acetaminophen, ibuprofen, naproxen, or ketoprofen unless instructed by your care team. These medications may hide a fever. Be careful brushing or flossing your teeth or using a toothpick because you may get an infection or bleed more easily. If you have any dental work done, tell your dentist you are receiving this medication. Talk to your care team if you wish to become pregnant or think you might be pregnant. This medication can cause serious birth defects. Talk to your care team about effective forms of contraception. Do not breast-feed while taking this medication. What side effects may I notice from receiving this medication? Side effects that you should report to your care team as soon as possible: Allergic  reactions--skin rash, itching, hives, swelling of the face, lips, tongue, or throat Infection--fever, chills, cough, sore throat, wounds that don't heal, pain or trouble when passing urine, general feeling of discomfort or being unwell Low red blood cell level--unusual weakness or fatigue, dizziness, headache, trouble breathing Pain, tingling, or numbness in the hands or feet, muscle weakness, change in vision, confusion or trouble speaking, loss of balance or coordination, trouble walking, seizures Unusual bruising or bleeding Side effects that usually do not require medical attention (report to your care team if they continue or are bothersome): Hair loss Nausea Unusual weakness or fatigue Vomiting This list may not describe all possible side effects. Call your doctor for medical advice about side effects. You may report side effects to FDA at 1-800-FDA-1088. Where should I keep my medication? This medication is given in a hospital or clinic. It will not be stored at home. NOTE: This sheet is a summary. It may not cover all possible information. If you have questions about this medicine, talk to your doctor, pharmacist, or health care provider.  2023 Elsevier/Gold Standard (2022-01-25 00:00:00)

## 2022-08-24 ENCOUNTER — Other Ambulatory Visit: Payer: Self-pay

## 2022-08-25 ENCOUNTER — Encounter: Payer: Self-pay | Admitting: Hematology and Oncology

## 2022-08-30 NOTE — Progress Notes (Unsigned)
Cardiology Office Note:    Date:  09/02/2022   ID:  Lauren Chandler, DOB 19-Jun-1966, MRN [id number]  PCP:  Michael Boston, MD   [facility] Prov[facility] Cardiologist:  Lenna Sciara, MD Referring MD: Michael Boston, MD   Chief Complaint/Reason for Referral: Elevated calcium score  ASSESSMENT:    1. Coronary artery calcification seen on CAT scan   2. Aortic atherosclerosis ([facility])   3. Hyperlipidemia LDL goal <70   4. 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 Cardiomyopathy, unspecified type ([facility])      PLAN:    In order of problems listed above: 1.  Coronary artery calcification: Continue aspirin; intolerant of multiple statins.  See #3 below. 2.  Aortic atherosclerosis: See #1 above. 3.  Hyperlipidemia: The patient has tried atorvastatin and rosuvastatin and is intolerant due to myalgias.  The patient is seeing pharmacy later this month.  We will check a lipid panel and LP(a) today.  Her LDL checked at her doctor's office several months ago was above 200. 4.  Primary peritoneal adenocarcinoma: She is undergoing chemotherapy.  5.  Cardiomyopathy: I will start Toprol XL 12.5 mg at bedtime and losartan 12.5 mg at bedtime.  The patient is tachycardic and I do not think a coronary CTA will be a good test for her.  We will obtain coronary angiography and right heart catheterization study to evaluate her cardiomyopathy.  We will plan on starting an SGLT2 inhibitor in the future.  More than we will check lab work in preparation for cardiac catheterization.              Dispo:  Return in about 3 months (around 12/03/2022).      Medication Adjustments/Labs and Tests Ordered: Current medicines are reviewed at length with the patient today.  Concerns regarding medicines are outlined above.  The following changes have been made:     Labs/tests ordered: Orders Placed This Encounter  Procedures   Lipid panel   Lipoprotein A (LPA)    Medication Changes: Meds ordered this  encounter  Medications   losartan (COZAAR) 25 MG tablet    Sig: Take 0.5 tablets (12.5 mg total) by mouth at bedtime.    Dispense:  45 tablet    Refill:  3   metoprolol succinate (TOPROL XL) 25 MG 24 hr tablet    Sig: Take 0.5 tablets (12.5 mg total) by mouth at bedtime.    Dispense:  45 tablet    Refill:  3     Current medicines are reviewed at length with the patient today.  The patient does not have concerns regarding medicines.   History of Present Illness:    FOCUSED PROBLEM LIST:   1.  Coronary artery calcification and aortic atherosclerosis CT scan 2023 2.  Hyperlipidemia; intolerant of atorvastatin and rosuvastatin 3.  Hodgkin's disease status post XRT 4.  Right breast cancer status post mastectomy and chemotherapy 5.  GERD 6.  BMI of 30 7.  Hypothyroidism 8.  Primary peritoneal adenocarcinoma diagnosed 2023; being treated with carboplatin and Taxol 9.  Cardiomyopathy ejection fraction 40 to 45%  October 2023 consultation: The patient is a 56 y.o. female with the indicated medical history here for recommendations regarding elevated calcium score.  Patient had a screening calcium score done in August elevated calcium aortic atherosclerosis.  She was started on rosuvastatin 10 mg.  She is here for further recommendations.  On review of her record it seems that she was diagnosed with peritoneal adenocarcinoma started  on chemotherapy recently.  She developed ascites and underwent paracentesis recently.  This is helped her orthopnea and dyspnea somewhat but she is still complaining of this though it very low level.  She denies any exertional angina, presyncope or syncope.  She has had no bleeding or bruising, unilateral lower extremity edema, black or bloody stools, or signs or symptoms of stroke.  She does not smoke.  She is recently retired from being a Pharmacist, hospital.  Plan: Obtain echocardiogram, start aspirin 81 mg, and refer to pharmacy given intolerance to atorvastatin and  rosuvastatin.  Today: In the interim the patient had an echocardiogram which showed ejection fraction 40 to 45% with hypokinesis.  She is here to discuss those results.  She is responding to therapy our plans for a few more cycles before potential debulking surgery.  Her breathing is stable.  She denies any exertional angina.  She denies any presyncope or syncope.  She has had no peripheral edema.  She did require a paracentesis October and the fluid has not not reaccumulated.  Overall she is doing fairly well.        Current Medications: Current Meds  Medication Sig   acetaminophen (TYLENOL) 500 MG tablet Take 1,000 mg by mouth every 6 (six) hours as needed for moderate pain.   aspirin EC 81 MG tablet Take 1 tablet (81 mg total) by mouth daily. Swallow whole.   Cholecalciferol (DIALYVITE VITAMIN D 5000) 125 MCG (5000 UT) capsule Take 5,000 Units by mouth daily.   dexamethasone (DECADRON) 4 MG tablet Take 2 tabs at the night before and 2 tab the morning of chemotherapy, every 3 weeks, by mouth x 6 cycles   hydrOXYzine (VISTARIL) 25 MG capsule Take 1 capsule (25 mg total) by mouth 3 (three) times daily as needed.   ibuprofen (ADVIL) 200 MG tablet Take 400 mg by mouth every 6 (six) hours as needed for moderate pain.   levothyroxine (SYNTHROID) 125 MCG tablet Take 125 mcg by mouth every morning.   lidocaine-prilocaine (EMLA) cream Apply to affected area once   losartan (COZAAR) 25 MG tablet Take 0.5 tablets (12.5 mg total) by mouth at bedtime.   metoprolol succinate (TOPROL XL) 25 MG 24 hr tablet Take 0.5 tablets (12.5 mg total) by mouth at bedtime.   ondansetron (ZOFRAN) 8 MG tablet Take 1 tablet (8 mg total) by mouth every 8 (eight) hours as needed for nausea or vomiting. Start on the third day after chemotherapy.   oxyCODONE (OXY IR/ROXICODONE) 5 MG immediate release tablet Take 1 tablet (5 mg total) by mouth every 6 (six) hours as needed for severe pain.   pantoprazole (PROTONIX) 40 MG tablet  Take 40 mg by mouth daily.   prochlorperazine (COMPAZINE) 10 MG tablet Take 1 tablet (10 mg total) by mouth every 6 (six) hours as needed for nausea or vomiting.   vitamin B-12 (CYANOCOBALAMIN) 500 MCG tablet Take 500 mcg by mouth daily.     Allergies:    Patient has no known allergies.   Social History:   Social History   Tobacco Use   Smoking status: Never   Smokeless tobacco: Never  Substance Use Topics   Alcohol use: Yes    Comment: rare   Drug use: Never     Family Hx: Family History  Problem Relation Age of Onset   Prostate cancer Father    Prostate cancer Brother    Colon cancer Maternal Grandmother    Breast cancer Paternal Grandmother    Ovarian cancer  Neg Hx    Endometrial cancer Neg Hx    Pancreatic cancer Neg Hx      Review of Systems:   Please see the history of present illness.    All other systems reviewed and are negative.     EKGs/Labs/Other Test Reviewed:    EKG:  EKG performed today that I personally reviewed demonstrates sinus tachycardia  Prior CV studies:  TTE 2023:  1. Left ventricular ejection fraction, by estimation, is 40 to 45%. The  left ventricle has mildly decreased function. The left ventricle  demonstrates global hypokinesis. Left ventricular diastolic parameters are  indeterminate.   2. Right ventricular systolic function is normal. The right ventricular  size is normal.   3. The mitral valve is normal in structure. Mild mitral valve  regurgitation. No evidence of mitral stenosis.   4. The aortic valve is normal in structure. Aortic valve regurgitation is  mild to moderate. Aortic valve sclerosis/calcification is present, without  any evidence of aortic stenosis.   5. The inferior vena cava is normal in size with greater than 50%  respiratory variability, suggesting right atrial pressure of 3 mmHg.   Calcium score 2023 Left Main: 8.0 LAD: 0 LCx: 40.2 RCA: 1.95 Total Agatston score: 50.2 [place] database percentile:  90 Aortic atherosclerosis. No acute extra cardiac abnormality.  Other studies Reviewed: Review of the additional studies/records demonstrates: CT abdomen pelvis  Recent Labs: 08/23/2022: ALT 18; BUN 23; Creatinine 0.92; Hemoglobin 10.9; Platelet Count 551; Potassium 4.0; Sodium 136   Recent Lipid Panel No results found for: "CHOL", "TRIG", "HDL", "LDLCALC", "LDLDIRECT"  External labs from February 2023 demonstrated creatinine 0.9, normal LFTs, hemoglobin 13.4, total cholesterol 294, triglycerides 180, HDL 49, LDL obtained  Risk Assessment/Calculations:                Physical Exam:    VS:  BP 112/64   Pulse (!) 118   Ht 5' (1.524 m)   Wt 160 lb (72.6 kg)   SpO2 97%   BMI 31.25 kg/m    Wt Readings from Last 3 Encounters:  09/02/22 160 lb (72.6 kg)  08/23/22 165 lb (74.8 kg)  08/16/22 168 lb 6.4 oz (76.4 kg)    GENERAL:  No apparent distress, AOx3 HEENT:  No carotid bruits, +2 carotid impulses, no scleral icterus; no JVD CAR: Regular but tachycardic no murmurs, gallops, rubs, or thrills; port in right upper chest RES:  Clear to auscultation bilaterally ABD: Firm, mildly distended, positive bowel sounds x 4 VASC:  +2 radial pulses, +2 carotid pulses, palpable pedal pulses NEURO:  CN 2-12 grossly intact; motor and sensory grossly intact PSYCH:  No active depression or anxiety EXT:  No edema, ecchymosis, or cyanosis  Signed, Early Osmond, MD  09/02/2022 9:54 AM    [address], [place], [place]  [id number] Phone: [phone]; Fax: [phone]   Note:  This document was prepared using Dragon voice recognition software and may include unintentional dictation errors.

## 2022-08-31 ENCOUNTER — Other Ambulatory Visit: Payer: Self-pay

## 2022-09-02 ENCOUNTER — Encounter: Payer: Self-pay | Admitting: Internal Medicine

## 2022-09-02 ENCOUNTER — Encounter: Payer: Self-pay | Admitting: Hematology and Oncology

## 2022-09-02 ENCOUNTER — Ambulatory Visit: Payer: BC Managed Care – PPO | Attending: Internal Medicine | Admitting: Internal Medicine

## 2022-09-02 VITALS — BP 112/64 | HR 118 | Ht 60.0 in | Wt 160.0 lb

## 2022-09-02 DIAGNOSIS — I700 Atherosclerosis of aorta: Secondary | ICD-10-CM | POA: Diagnosis not present

## 2022-09-02 DIAGNOSIS — C482 Malignant neoplasm of peritoneum, unspecified: Secondary | ICD-10-CM

## 2022-09-02 DIAGNOSIS — E785 Hyperlipidemia, unspecified: Secondary | ICD-10-CM

## 2022-09-02 DIAGNOSIS — I2510 Atherosclerotic heart disease of native coronary artery without angina pectoris: Secondary | ICD-10-CM | POA: Diagnosis not present

## 2022-09-02 DIAGNOSIS — I429 Cardiomyopathy, unspecified: Secondary | ICD-10-CM

## 2022-09-02 MED ORDER — METOPROLOL SUCCINATE ER 25 MG PO TB24: 12.5000 mg | ORAL_TABLET | Freq: Every day | ORAL | 3 refills | Status: DC

## 2022-09-02 MED ORDER — LOSARTAN POTASSIUM 25 MG PO TABS: 12.5000 mg | ORAL_TABLET | Freq: Every day | ORAL | 3 refills | Status: DC

## 2022-09-02 NOTE — Patient Instructions (Signed)
Medication Instructions:  Your physician has recommended you make the following change in your medication:  1.) start losartan 25 mg - take HALF TAB AT BEDTIME 2.) start metoprolol succinate (Toprol XL) 25 mg -take HALF TAB AT BEDTIME  *If you need a refill on your cardiac medications before your next appointment, please call your pharmacy*   Lab Work: Today: lipids, Lp(a)   Testing/Procedures: RIGHT AND LEFT HEART CATH Your physician has requested that you have a cardiac catheterization. Cardiac catheterization is used to diagnose and/or treat various heart conditions. Doctors may recommend this procedure for a number of different reasons. The most common reason is to evaluate chest pain. Chest pain can be a symptom of coronary artery disease (CAD), and cardiac catheterization can show whether plaque is narrowing or blocking your heart's arteries. This procedure is also used to evaluate the valves, as well as measure the blood flow and oxygen levels in different parts of your heart. For further information please visit [email or web address]. Please follow instruction sheet, as given.   Follow-Up: At [facility], you and your health needs are our priority.  As part of our continuing mission to provide you with exceptional heart care, we have created designated Provider Care Teams.  These Care Teams include your primary Cardiologist (physician) and Advanced Practice Providers (APPs -  Physician Assistants and Nurse Practitioners) who all work together to provide you with the care you need, when you need it.  We recommend signing up for the patient portal called "MyChart".  Sign up information is provided on this After Visit Summary.  MyChart is used to connect with patients for Virtual Visits (Telemedicine).  Patients are able to view lab/test results, encounter notes, upcoming appointments, etc.  Non-urgent messages can be sent to your provider as well.   To learn more about what you can  do with MyChart, go to [email or web address].    Your next appointment:   3 month(s)  The format for your next appointment:   In Person  Provider:   Early Osmond, MD     Other Instructions       Cardiac/Peripheral Catheterization   You are scheduled for a Cardiac Catheterization on Thursday, January 4 with Dr. Lenna Sciara.  1. Please arrive at the Main Entrance A at [facility]: [place], [place] [address] on January 4 at 7:00 AM (This time is two hours before your procedure to ensure your preparation). Free valet parking service is available. You will check in at ADMITTING. The support person will be asked to wait in the waiting room.  It is OK to have someone drop you off and come back when you are ready to be discharged.        Special note: Every effort is made to have your procedure done on time. Please understand that emergencies sometimes delay scheduled procedures.   . 2. Diet: Do not eat solid foods after midnight.  You may have clear liquids until 5 AM the day of the procedure.  3. Labs: You will need to have blood drawn (cbc, bmet).  You do not need to be fasting.  4. Medication instructions in preparation for your procedure:   Contrast Allergy: No   On the morning of your procedure, take Aspirin 81 mg and any morning medicines NOT listed above.  You may use sips of water.  5. Plan to go home the same day, you will only stay overnight if medically necessary. 6. You MUST have  a responsible adult to drive you home. 7. An adult MUST be with you the first 24 hours after you arrive home. 8. Bring a current list of your medications, and the last time and date medication taken. 9. Bring ID and current insurance cards. 10.Please wear clothes that are easy to get on and off and wear slip-on shoes.  Thank you for allowing [place] to care for you!   -- [facility]   Important Information About Sugar

## 2022-09-03 ENCOUNTER — Other Ambulatory Visit: Payer: Self-pay

## 2022-09-03 LAB — LIPOPROTEIN A (LPA): Lipoprotein (a): 288 nmol/L — ABNORMAL HIGH (ref <75.0)

## 2022-09-03 LAB — LIPID PANEL
Chol/HDL Ratio: 8.8 ratio — ABNORMAL HIGH (ref 0.0–4.4)
Cholesterol, Total: 379 mg/dL — ABNORMAL HIGH (ref 100–199)
HDL: 43 mg/dL (ref >39)
LDL Chol Calc (NIH): 282 mg/dL — ABNORMAL HIGH (ref 0–99)
Triglycerides: 246 mg/dL — ABNORMAL HIGH (ref 0–149)
VLDL Cholesterol Cal: 54 mg/dL — ABNORMAL HIGH (ref 5–40)

## 2022-09-06 ENCOUNTER — Ambulatory Visit: Payer: BC Managed Care – PPO | Attending: Cardiovascular Disease | Admitting: Pharmacist

## 2022-09-06 DIAGNOSIS — E785 Hyperlipidemia, unspecified: Secondary | ICD-10-CM | POA: Diagnosis not present

## 2022-09-06 MED ORDER — ROSUVASTATIN CALCIUM 5 MG PO TABS: 5.0000 mg | ORAL_TABLET | ORAL | 3 refills | Status: DC

## 2022-09-06 NOTE — Progress Notes (Addendum)
Patient ID: Lauren Chandler                 DOB: [birth year]                    MRN: [id number]      HPI: Lauren Chandler is a 56 y.o. female patient referred to lipid clinic by [facility]. PMH is significant for HLD, GERD, hypothyroidism, peritoneal adenocarcinoma (carboplatin and taxol), hodgkin's, right breast cancer s/p mastectomy and chemotherapy, aortic atherosclerosis. Her coronary calcium score had a total agatston score of 50.2 and in 90th percentile. Patient was referred to lipid clinic for medication management.  Patient is intolerant to statins and has tried both high intensity statins at low doses: atorvastatin 10 mg and rosuvastatin 10 mg and had to discontinue both due to myalgias. Currently on no lipid lowering medications. Her current labs show elevated TC, lipoprotein (a), TG, and LDL-C.  Current Medications: none Intolerances: rosuvastatin and atorvastatin 10 mg (myalgias) Risk Factors: hx of cancer, coronary artery calcification on CAT scan LDL goal: <70  Diet: high protein diet (chicken, keish/eggs, bacon, kodiak, tuna, chicken salad, grilled salmon limited carbs, no desserts, and consumes vegetables/fruits, but mainly focuses on protein. Chemo can make patient nauseous and she will not have an appetite  Exercise: trying to walk 73mn/day, used to play pickleball and tennis when able, likes to swim during the warmer months, does not have a membership for a gym  Family History:  Family History  Problem Relation Age of Onset   Prostate cancer Father    Prostate cancer Brother    Colon cancer Maternal Grandmother    Breast cancer Paternal Grandmother    Ovarian cancer Neg Hx    Endometrial cancer Neg Hx    Pancreatic cancer Neg Hx      Social History:  reports that she has never smoked. She has never used smokeless tobacco. She reports current alcohol use. She reports that she does not use drugs.   Labs: Lipid Panel     Component Value Date/Time   CHOL 379 (H)  09/02/2022 0933   TRIG 246 (H) 09/02/2022 0933   HDL 43 09/02/2022 0933   CHOLHDL 8.8 (H) 09/02/2022 0933   LDLCALC 282 (H) 09/02/2022 0933   LABVLDL 54 (H) 09/02/2022 0933     Past Medical History:  Diagnosis Date   BRCA2 gene mutation positive 01/08/2019   [place]   [place]   [place]   [place]   [place]    [place]   Thyroid dysfunction    2020, as results on Hodgkin's radiation    Current Outpatient Medications on File Prior to Visit  Medication Sig Dispense Refill   acetaminophen (TYLENOL) 500 MG tablet Take 1,000 mg by mouth every 6 (six) hours as needed for moderate pain.     aspirin EC 81 MG tablet Take 1 tablet (81 mg total) by mouth daily. Swallow whole. 90 tablet 3   Cholecalciferol (DIALYVITE VITAMIN D 5000) 125 MCG (5000 UT) capsule Take 5,000 Units by mouth daily.     dexamethasone (DECADRON) 4 MG tablet Take 2 tabs at the night before and 2 tab the morning of chemotherapy, every 3 weeks, by mouth x 6 cycles 36 tablet 6   hydrOXYzine (VISTARIL) 25 MG capsule Take 1 capsule (25 mg total) by mouth 3 (three) times daily as needed. 30 capsule 2   ibuprofen (ADVIL) 200 MG tablet Take 400  mg by mouth every 6 (six) hours as needed for moderate pain.     levothyroxine (SYNTHROID) 125 MCG tablet Take 125 mcg by mouth every morning.     lidocaine-prilocaine (EMLA) cream Apply to affected area once 30 g 3   losartan (COZAAR) 25 MG tablet Take 0.5 tablets (12.5 mg total) by mouth at bedtime. 45 tablet 3   metoprolol succinate (TOPROL XL) 25 MG 24 hr tablet Take 0.5 tablets (12.5 mg total) by mouth at bedtime. 45 tablet 3   ondansetron (ZOFRAN) 8 MG tablet Take 1 tablet (8 mg total) by mouth every 8 (eight) hours as needed for nausea or vomiting. Start on the third day after chemotherapy. 30 tablet 1   oxyCODONE (OXY IR/ROXICODONE) 5 MG immediate release tablet Take 1 tablet (5 mg total) by mouth every 6 (six) hours as  needed for severe pain. 30 tablet 0   pantoprazole (PROTONIX) 40 MG tablet Take 40 mg by mouth daily.     prochlorperazine (COMPAZINE) 10 MG tablet Take 1 tablet (10 mg total) by mouth every 6 (six) hours as needed for nausea or vomiting. 30 tablet 1   vitamin B-12 (CYANOCOBALAMIN) 500 MCG tablet Take 500 mcg by mouth daily.     No current facility-administered medications on file prior to visit.   Assessment/Plan: Dyslipidemia Assessment: LDL-C 282, LP(a) 288, TG 246, TC 379 Patient aware that needs to start lipid lowering therapy: discussed PCSK9i vs re-challenging statin medication Diet consists of high protein consumption, with limited carbs. Tends to eat protein prior to anything else including vegetables. Does not drink soda. Sometimes is nauseous from the chemo, does not have an appetite  Physical activity consists of walking when she can for 30 minutes/day, used to play pickle ball, and swims in the summer time Patient concerned about PCKS9i and interaction with her chemo  Plan: Since patient hesitant about PCKS9i, will first re-challenge the rosuvastatin 5 mg 3x per week and titrate as tolerated Monitor for side effects of the medication such as muscle aches/pains Follow up labs in few weeks to assess new lipid panel Continue current diet and physical activity   Thank you,  Sandford Craze, PharmD. [facility] Acute [facility] PGY-1 09/06/2022 5:00 PM   Melissa D Prentiss Bells, Pharm.D, BCPS, CPP [place]  [address], [place], [place] [address]  Phone: [phone]; Fax: [phone]

## 2022-09-06 NOTE — Assessment & Plan Note (Addendum)
Assessment: LDL-C 282, LP(a) 288, TG 246, TC 379 Patient aware that needs to start lipid lowering therapy: discussed PCSK9i vs re-challenging statin medication Diet consists of high protein consumption, with limited carbs. Tends to eat protein prior to anything else including vegetables. Does not drink soda. Sometimes is nauseous from the chemo, does not have an appetite  Physical activity consists of walking when she can for 30 minutes/day, used to play pickle ball, and swims in the summer time Patient concerned about PCKS9i and interaction with her chemo  Plan: Since patient hesitant about PCKS9i, will first re-challenge the rosuvastatin 5 mg 3x per week and titrate as tolerated Monitor for side effects of the medication such as muscle aches/pains Follow up labs in few weeks to assess new lipid panel Continue current diet and physical activity

## 2022-09-06 NOTE — Patient Instructions (Addendum)
-  start rosuvastatin 5 mg 3x per week -continue to eat lean proteins and limiting carbs/sweets -continue to walk 30 minutes per day when able

## 2022-09-07 ENCOUNTER — Telehealth: Payer: Self-pay | Admitting: Pharmacist

## 2022-09-07 DIAGNOSIS — E785 Hyperlipidemia, unspecified: Secondary | ICD-10-CM

## 2022-09-17 ENCOUNTER — Encounter: Payer: Self-pay | Admitting: Hematology and Oncology

## 2022-09-20 ENCOUNTER — Inpatient Hospital Stay: Payer: BC Managed Care – PPO

## 2022-09-20 ENCOUNTER — Other Ambulatory Visit: Payer: Self-pay

## 2022-09-20 ENCOUNTER — Inpatient Hospital Stay: Payer: BC Managed Care – PPO | Attending: Gynecologic Oncology | Admitting: Hematology and Oncology

## 2022-09-20 VITALS — BP 125/71 | HR 102 | Temp 97.9°F | Resp 18 | Ht 60.0 in | Wt 159.2 lb

## 2022-09-20 VITALS — BP 133/75 | HR 104 | Temp 98.2°F | Resp 18

## 2022-09-20 DIAGNOSIS — R971 Elevated cancer antigen 125 [CA 125]: Secondary | ICD-10-CM | POA: Diagnosis not present

## 2022-09-20 DIAGNOSIS — D6481 Anemia due to antineoplastic chemotherapy: Secondary | ICD-10-CM | POA: Diagnosis not present

## 2022-09-20 DIAGNOSIS — C482 Malignant neoplasm of peritoneum, unspecified: Secondary | ICD-10-CM | POA: Diagnosis not present

## 2022-09-20 DIAGNOSIS — Z9081 Acquired absence of spleen: Secondary | ICD-10-CM | POA: Insufficient documentation

## 2022-09-20 DIAGNOSIS — Z79890 Hormone replacement therapy (postmenopausal): Secondary | ICD-10-CM | POA: Insufficient documentation

## 2022-09-20 DIAGNOSIS — G620 Drug-induced polyneuropathy: Secondary | ICD-10-CM | POA: Insufficient documentation

## 2022-09-20 DIAGNOSIS — D75839 Thrombocytosis, unspecified: Secondary | ICD-10-CM | POA: Insufficient documentation

## 2022-09-20 DIAGNOSIS — R180 Malignant ascites: Secondary | ICD-10-CM | POA: Insufficient documentation

## 2022-09-20 DIAGNOSIS — Z79899 Other long term (current) drug therapy: Secondary | ICD-10-CM | POA: Insufficient documentation

## 2022-09-20 DIAGNOSIS — T451X5A Adverse effect of antineoplastic and immunosuppressive drugs, initial encounter: Secondary | ICD-10-CM | POA: Insufficient documentation

## 2022-09-20 DIAGNOSIS — Z5111 Encounter for antineoplastic chemotherapy: Secondary | ICD-10-CM | POA: Diagnosis not present

## 2022-09-20 DIAGNOSIS — I429 Cardiomyopathy, unspecified: Secondary | ICD-10-CM

## 2022-09-20 DIAGNOSIS — J90 Pleural effusion, not elsewhere classified: Secondary | ICD-10-CM | POA: Insufficient documentation

## 2022-09-20 LAB — T4, FREE: Free T4: 1.46 ng/dL — ABNORMAL HIGH (ref 0.61–1.12)

## 2022-09-20 LAB — CBC WITH DIFFERENTIAL (CANCER CENTER ONLY)
Abs Immature Granulocytes: 0.04 10*3/uL (ref 0.00–0.07)
Basophils Absolute: 0 10*3/uL (ref 0.0–0.1)
Basophils Relative: 0 %
Eosinophils Absolute: 0 10*3/uL (ref 0.0–0.5)
Eosinophils Relative: 0 %
HCT: 31.9 % — ABNORMAL LOW (ref 36.0–46.0)
Hemoglobin: 10.5 g/dL — ABNORMAL LOW (ref 12.0–15.0)
Immature Granulocytes: 1 %
Lymphocytes Relative: 20 %
Lymphs Abs: 1.4 10*3/uL (ref 0.7–4.0)
MCH: 28.2 pg (ref 26.0–34.0)
MCHC: 32.9 g/dL (ref 30.0–36.0)
MCV: 85.5 fL (ref 80.0–100.0)
Monocytes Absolute: 0.1 10*3/uL (ref 0.1–1.0)
Monocytes Relative: 1 %
Neutro Abs: 5.5 10*3/uL (ref 1.7–7.7)
Neutrophils Relative %: 78 %
Platelet Count: 552 10*3/uL — ABNORMAL HIGH (ref 150–400)
RBC: 3.73 MIL/uL — ABNORMAL LOW (ref 3.87–5.11)
RDW: 20.6 % — ABNORMAL HIGH (ref 11.5–15.5)
WBC Count: 7 10*3/uL (ref 4.0–10.5)
nRBC: 0 % (ref 0.0–0.2)

## 2022-09-20 LAB — CMP (CANCER CENTER ONLY)
ALT: 19 U/L (ref 0–44)
AST: 20 U/L (ref 15–41)
Albumin: 3.8 g/dL (ref 3.5–5.0)
Alkaline Phosphatase: 86 U/L (ref 38–126)
Anion gap: 9 (ref 5–15)
BUN: 21 mg/dL — ABNORMAL HIGH (ref 6–20)
CO2: 25 mmol/L (ref 22–32)
Calcium: 9.9 mg/dL (ref 8.9–10.3)
Chloride: 102 mmol/L (ref 98–111)
Creatinine: 0.97 mg/dL (ref 0.44–1.00)
GFR, Estimated: >60 mL/min (ref >60)
Glucose, Bld: 166 mg/dL — ABNORMAL HIGH (ref 70–99)
Potassium: 4.1 mmol/L (ref 3.5–5.1)
Sodium: 136 mmol/L (ref 135–145)
Total Bilirubin: 0.3 mg/dL (ref 0.3–1.2)
Total Protein: 9 g/dL — ABNORMAL HIGH (ref 6.5–8.1)

## 2022-09-20 LAB — TSH: TSH: 1.356 u[IU]/mL (ref 0.350–4.500)

## 2022-09-20 MED ORDER — SODIUM CHLORIDE 0.9 % IV SOLN
150.0000 mg | Freq: Once | INTRAVENOUS | Status: DC
  Filled 2022-09-20: qty 5

## 2022-09-20 MED ORDER — HEPARIN SOD (PORK) LOCK FLUSH 100 UNIT/ML IV SOLN
500.0000 [IU] | Freq: Once | INTRAVENOUS | Status: AC | PRN
  Administered 2022-09-20: 500 [IU]

## 2022-09-20 MED ORDER — SODIUM CHLORIDE 0.9 % IV SOLN
10.0000 mg | Freq: Once | INTRAVENOUS | Status: AC
  Administered 2022-09-20: 10 mg via INTRAVENOUS
  Filled 2022-09-20: qty 10

## 2022-09-20 MED ORDER — SODIUM CHLORIDE 0.9 % IV SOLN
150.0000 mg | Freq: Once | INTRAVENOUS | Status: AC
  Administered 2022-09-20: 150 mg via INTRAVENOUS
  Filled 2022-09-20: qty 150

## 2022-09-20 MED ORDER — SODIUM CHLORIDE 0.9 % IV SOLN
10.0000 mg | Freq: Once | INTRAVENOUS | Status: DC
  Filled 2022-09-20: qty 1

## 2022-09-20 MED ORDER — FAMOTIDINE IN NACL 20-0.9 MG/50ML-% IV SOLN
20.0000 mg | Freq: Once | INTRAVENOUS | Status: AC
  Administered 2022-09-20: 20 mg via INTRAVENOUS
  Filled 2022-09-20: qty 50

## 2022-09-20 MED ORDER — SODIUM CHLORIDE 0.9 % IV SOLN
595.2000 mg | Freq: Once | INTRAVENOUS | Status: AC
  Administered 2022-09-20: 600 mg via INTRAVENOUS
  Filled 2022-09-20: qty 60

## 2022-09-20 MED ORDER — CETIRIZINE HCL 10 MG/ML IV SOLN
10.0000 mg | Freq: Once | INTRAVENOUS | Status: AC
  Administered 2022-09-20: 10 mg via INTRAVENOUS
  Filled 2022-09-20: qty 1

## 2022-09-20 MED ORDER — SODIUM CHLORIDE 0.9 % IV SOLN
175.0000 mg/m2 | Freq: Once | INTRAVENOUS | Status: AC
  Administered 2022-09-20: 318 mg via INTRAVENOUS
  Filled 2022-09-20: qty 53

## 2022-09-20 MED ORDER — PALONOSETRON HCL INJECTION 0.25 MG/5ML
0.2500 mg | Freq: Once | INTRAVENOUS | Status: AC
  Administered 2022-09-20: 0.25 mg via INTRAVENOUS
  Filled 2022-09-20: qty 5

## 2022-09-20 MED ORDER — SODIUM CHLORIDE 0.9% FLUSH
10.0000 mL | INTRAVENOUS | Status: DC | PRN
  Administered 2022-09-20: 10 mL

## 2022-09-20 MED ORDER — SODIUM CHLORIDE 0.9% FLUSH
10.0000 mL | Freq: Once | INTRAVENOUS | Status: AC
  Administered 2022-09-20: 10 mL

## 2022-09-20 MED ORDER — SODIUM CHLORIDE 0.9 % IV SOLN: Freq: Once | INTRAVENOUS | Status: AC

## 2022-09-20 NOTE — Patient Instructions (Signed)
[facility] ONCOLOGY  Discharge Instructions: Thank you for choosing [facility] to provide your oncology and hematology care.   If you have a lab appointment with the [facility], please go directly to the [facility] and check in at the registration area.   Wear comfortable clothing and clothing appropriate for easy access to any Portacath or PICC line.   We strive to give you quality time with your provider. You may need to reschedule your appointment if you arrive late (15 or more minutes).  Arriving late affects you and other patients whose appointments are after yours.  Also, if you miss three or more appointments without notifying the office, you may be dismissed from the clinic at the provider's discretion.      For prescription refill requests, have your pharmacy contact our office and allow 72 hours for refills to be completed.    Today you received the following chemotherapy and/or immunotherapy agents: Taxol/Carbo      To help prevent nausea and vomiting after your treatment, we encourage you to take your nausea medication as directed.  BELOW ARE SYMPTOMS THAT SHOULD BE REPORTED IMMEDIATELY: *FEVER GREATER THAN 100.4 F (38 C) OR HIGHER *CHILLS OR SWEATING *NAUSEA AND VOMITING THAT IS NOT CONTROLLED WITH YOUR NAUSEA MEDICATION *UNUSUAL SHORTNESS OF BREATH *UNUSUAL BRUISING OR BLEEDING *URINARY PROBLEMS (pain or burning when urinating, or frequent urination) *BOWEL PROBLEMS (unusual diarrhea, constipation, pain near the anus) TENDERNESS IN MOUTH AND THROAT WITH OR WITHOUT PRESENCE OF ULCERS (sore throat, sores in mouth, or a toothache) UNUSUAL RASH, SWELLING OR PAIN  UNUSUAL VAGINAL DISCHARGE OR ITCHING   Items with * indicate a potential emergency and should be followed up as soon as possible or go to the Emergency Department if any problems should occur.  Please show the CHEMOTHERAPY ALERT CARD or IMMUNOTHERAPY ALERT CARD at check-in  to the Emergency Department and triage nurse.  Should you have questions after your visit or need to cancel or reschedule your appointment, please contact [facility]  Dept: [phone]  and follow the prompts.  Office hours are 8:00 a.m. to 4:30 p.m. Monday - Friday. Please note that voicemails left after 4:00 p.m. may not be returned until the following business day.  We are closed weekends and major holidays. You have access to a nurse at all times for urgent questions. Please call the main number to the clinic Dept: [phone] and follow the prompts.   For any non-urgent questions, you may also contact your provider using MyChart. We now offer e-Visits for anyone 41 and older to request care online for non-urgent symptoms. For details visit [email or web address].   Also download the MyChart app! Go to the app store, search "MyChart", open the app, select [facility], and log in with your MyChart username and password.  Masks are optional in the cancer centers. If you would like for your care team to wear a mask while they are taking care of you, please let them know. You may have one support person who is at least 57 years old accompany you for your appointments. Paclitaxel Injection What is this medication? PACLITAXEL (PAK li TAX el) treats some types of cancer. It works by slowing down the growth of cancer cells. This medicine may be used for other purposes; ask your health care provider or pharmacist if you have questions. COMMON BRAND NAME(S): Onxol, Taxol What should I tell my care team before I take this medication?  They need to know if you have any of these conditions: Heart disease Liver disease Low white blood cell levels An unusual or allergic reaction to paclitaxel, other medications, foods, dyes, or preservatives If you or your partner are pregnant or trying to get pregnant Breast-feeding How should I use this medication? This medication is  injected into a vein. It is given by your care team in a hospital or clinic setting. Talk to your care team about the use of this medication in children. While it may be given to children for selected conditions, precautions do apply. Overdosage: If you think you have taken too much of this medicine contact a poison control center or emergency room at once. NOTE: This medicine is only for you. Do not share this medicine with others. What if I miss a dose? Keep appointments for follow-up doses. It is important not to miss your dose. Call your care team if you are unable to keep an appointment. What may interact with this medication? Do not take this medication with any of the following: Live virus vaccines Other medications may affect the way this medication works. Talk with your care team about all of the medications you take. They may suggest changes to your treatment plan to lower the risk of side effects and to make sure your medications work as intended. This list may not describe all possible interactions. Give your health care provider a list of all the medicines, herbs, non-prescription drugs, or dietary supplements you use. Also tell them if you smoke, drink alcohol, or use illegal drugs. Some items may interact with your medicine. What should I watch for while using this medication? Your condition will be monitored carefully while you are receiving this medication. You may need blood work while taking this medication. This medication may make you feel generally unwell. This is not uncommon as chemotherapy can affect healthy cells as well as cancer cells. Report any side effects. Continue your course of treatment even though you feel ill unless your care team tells you to stop. This medication can cause serious allergic reactions. To reduce the risk, your care team may give you other medications to take before receiving this one. Be sure to follow the directions from your care team. This  medication may increase your risk of getting an infection. Call your care team for advice if you get a fever, chills, sore throat, or other symptoms of a cold or flu. Do not treat yourself. Try to avoid being around people who are sick. This medication may increase your risk to bruise or bleed. Call your care team if you notice any unusual bleeding. Be careful brushing or flossing your teeth or using a toothpick because you may get an infection or bleed more easily. If you have any dental work done, tell your dentist you are receiving this medication. Talk to your care team if you may be pregnant. Serious birth defects can occur if you take this medication during pregnancy. Talk to your care team before breastfeeding. Changes to your treatment plan may be needed. What side effects may I notice from receiving this medication? Side effects that you should report to your care team as soon as possible: Allergic reactions--skin rash, itching, hives, swelling of the face, lips, tongue, or throat Heart rhythm changes--fast or irregular heartbeat, dizziness, feeling faint or lightheaded, chest pain, trouble breathing Increase in blood pressure Infection--fever, chills, cough, sore throat, wounds that don't heal, pain or trouble when passing urine, general feeling of discomfort  or being unwell Low blood pressure--dizziness, feeling faint or lightheaded, blurry vision Low red blood cell level--unusual weakness or fatigue, dizziness, headache, trouble breathing Painful swelling, warmth, or redness of the skin, blisters or sores at the infusion site Pain, tingling, or numbness in the hands or feet Slow heartbeat--dizziness, feeling faint or lightheaded, confusion, trouble breathing, unusual weakness or fatigue Unusual bruising or bleeding Side effects that usually do not require medical attention (report to your care team if they continue or are bothersome): Diarrhea Hair loss Joint pain Loss of  appetite Muscle pain Nausea Vomiting This list may not describe all possible side effects. Call your doctor for medical advice about side effects. You may report side effects to FDA at 1-800-FDA-1088. Where should I keep my medication? This medication is given in a hospital or clinic. It will not be stored at home. NOTE: This sheet is a summary. It may not cover all possible information. If you have questions about this medicine, talk to your doctor, pharmacist, or health care provider.  2023 Elsevier/Gold Standard (2022-02-10 00:00:00) Carboplatin Injection What is this medication? CARBOPLATIN (KAR boe pla tin) treats some types of cancer. It works by slowing down the growth of cancer cells. This medicine may be used for other purposes; ask your health care provider or pharmacist if you have questions. COMMON BRAND NAME(S): Paraplatin What should I tell my care team before I take this medication? They need to know if you have any of these conditions: Blood disorders Hearing problems Kidney disease Recent or ongoing radiation therapy An unusual or allergic reaction to carboplatin, cisplatin, other medications, foods, dyes, or preservatives Pregnant or trying to get pregnant Breast-feeding How should I use this medication? This medication is injected into a vein. It is given by your care team in a hospital or clinic setting. Talk to your care team about the use of this medication in children. Special care may be needed. Overdosage: If you think you have taken too much of this medicine contact a poison control center or emergency room at once. NOTE: This medicine is only for you. Do not share this medicine with others. What if I miss a dose? Keep appointments for follow-up doses. It is important not to miss your dose. Call your care team if you are unable to keep an appointment. What may interact with this medication? Medications for seizures Some antibiotics, such as amikacin,  gentamicin, neomycin, streptomycin, tobramycin Vaccines This list may not describe all possible interactions. Give your health care provider a list of all the medicines, herbs, non-prescription drugs, or dietary supplements you use. Also tell them if you smoke, drink alcohol, or use illegal drugs. Some items may interact with your medicine. What should I watch for while using this medication? Your condition will be monitored carefully while you are receiving this medication. You may need blood work while taking this medication. This medication may make you feel generally unwell. This is not uncommon, as chemotherapy can affect healthy cells as well as cancer cells. Report any side effects. Continue your course of treatment even though you feel ill unless your care team tells you to stop. In some cases, you may be given additional medications to help with side effects. Follow all directions for their use. This medication may increase your risk of getting an infection. Call your care team for advice if you get a fever, chills, sore throat, or other symptoms of a cold or flu. Do not treat yourself. Try to avoid being around people  who are sick. Avoid taking medications that contain aspirin, acetaminophen, ibuprofen, naproxen, or ketoprofen unless instructed by your care team. These medications may hide a fever. Be careful brushing or flossing your teeth or using a toothpick because you may get an infection or bleed more easily. If you have any dental work done, tell your dentist you are receiving this medication. Talk to your care team if you wish to become pregnant or think you might be pregnant. This medication can cause serious birth defects. Talk to your care team about effective forms of contraception. Do not breast-feed while taking this medication. What side effects may I notice from receiving this medication? Side effects that you should report to your care team as soon as possible: Allergic  reactions--skin rash, itching, hives, swelling of the face, lips, tongue, or throat Infection--fever, chills, cough, sore throat, wounds that don't heal, pain or trouble when passing urine, general feeling of discomfort or being unwell Low red blood cell level--unusual weakness or fatigue, dizziness, headache, trouble breathing Pain, tingling, or numbness in the hands or feet, muscle weakness, change in vision, confusion or trouble speaking, loss of balance or coordination, trouble walking, seizures Unusual bruising or bleeding Side effects that usually do not require medical attention (report to your care team if they continue or are bothersome): Hair loss Nausea Unusual weakness or fatigue Vomiting This list may not describe all possible side effects. Call your doctor for medical advice about side effects. You may report side effects to FDA at 1-800-FDA-1088. Where should I keep my medication? This medication is given in a hospital or clinic. It will not be stored at home. NOTE: This sheet is a summary. It may not cover all possible information. If you have questions about this medicine, talk to your doctor, pharmacist, or health care provider.  2023 Elsevier/Gold Standard (2022-01-25 00:00:00)

## 2022-09-21 ENCOUNTER — Encounter: Payer: Self-pay | Admitting: Hematology and Oncology

## 2022-09-21 ENCOUNTER — Telehealth: Payer: Self-pay | Admitting: Surgery

## 2022-09-21 DIAGNOSIS — T451X5A Adverse effect of antineoplastic and immunosuppressive drugs, initial encounter: Secondary | ICD-10-CM | POA: Insufficient documentation

## 2022-09-21 DIAGNOSIS — D6481 Anemia due to antineoplastic chemotherapy: Secondary | ICD-10-CM | POA: Insufficient documentation

## 2022-09-21 DIAGNOSIS — G620 Drug-induced polyneuropathy: Secondary | ICD-10-CM | POA: Insufficient documentation

## 2022-09-21 LAB — CA 125: Cancer Antigen (CA) 125: 41.3 U/mL — ABNORMAL HIGH (ref 0.0–38.1)

## 2022-09-21 NOTE — Assessment & Plan Note (Signed)
Overall, she tolerated treatment well with some expected side effects Her recent weight loss is related to resolution of ascites Clinically, she felt great We will proceed with cycle 3 without further delay I plan to repeat imaging study next month with follow-up to see GYN surgeon to see whether she will be appropriate for interval surgery

## 2022-09-21 NOTE — Telephone Encounter (Addendum)
Spoke with patient regarding upcoming appointment and surgery schedules. Patient verbalized understanding and had no further concerns at this time.   Per Lenna Sciara, the plan will be to proceed with cycle 4 of chemotherapy as scheduled on 10/11/22 in order to get cardiac workup prior to surgery. This is necessary to have prior to surgery.  We will still plan on seeing you in the office to see Dr. Berline Lopes on 10/08/22 after your upcoming scans.  I have scheduled her for surgery on November 11, 2021 at [facility] with Dr. Berline Lopes. We will discuss this in more detail when she comes for her visit. This is always subject to change based on scan results and whatever is best for her but we just wanted to make sure the spot was held for her.   She may also get a phone call from the pre surgical department at [facility] to arrange for pre op appointment as well but this would need to take place after she meets with [place] just in case there's a change in plans.   Patient verbalized understanding and had no further concerns at this time.

## 2022-09-21 NOTE — Progress Notes (Signed)
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[place]) Overall, she tolerated treatment well with some expected side effects Her recent weight loss is related to resolution of ascites Clinically, she felt great We will proceed with cycle 3 without further delay I plan to repeat imaging study next month with follow-up to see GYN surgeon to see whether she will be appropriate for interval surgery  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 The patient denies recent history of bleeding such as epistaxis, hematuria or hematochezia. She is asymptomatic from the anemia. I will observe for now.  She does not require transfusion now. I will continue the chemotherapy at current dose without dosage adjustment.  If the anemia gets progressive worse in the future, I might have to delay her treatment or adjust the chemotherapy dose.   Peripheral neuropathy due to chemotherapy [facility]) she has mild peripheral neuropathy, likely related to side effects of treatment. It is only mild, not bothering the patient. I will observe for now If it gets worse in the future, I will consider modifying the dose of the treatment   Orders Placed This Encounter  Procedures   CT CHEST ABDOMEN PELVIS W CONTRAST    Standing Status:   Future    Standing Expiration Date:   09/21/2023    Order Specific Question:   Preferred imaging location?    Answer:   [facility]     Order Specific Question:   Radiology Contrast Protocol - do NOT remove file path    Answer:   [email or web address]    Order Specific Question:   Is patient pregnant?    Answer:   No   TSH    Standing Status:   Future    Number of Occurrences:   1    Standing Expiration Date:   09/21/2023   T4, free    Standing Status:   Future    Number of Occurrences:   1    Standing Expiration Date:   09/21/2023    All questions were answered. The patient knows to call the clinic with any problems, questions or concerns. The total time spent in the appointment was 30 minutes encounter with patients including review of chart and various tests results, discussions about plan of care and coordination of care plan   Heath Lark, MD 09/21/2022 7:59 AM  INTERVAL HISTORY: Please see below for problem oriented charting. she returns for treatment follow-up seen prior to cycle 3 of chemotherapy She has noted slight neuropathy in the fingers but not much in her toes Denies recent nausea, bloating or changes in bowel habits Overall, she felt great Denies recent signs or symptoms of congestive heart failure  REVIEW OF SYSTEMS:   Constitutional: Denies fevers, chills or abnormal weight loss Eyes: Denies blurriness of vision Ears, nose, mouth, throat, and face: Denies mucositis or sore throat Respiratory: Denies cough, dyspnea or wheezes Cardiovascular: Denies palpitation, chest discomfort or lower extremity swelling Gastrointestinal:  Denies nausea, heartburn or change in bowel habits Skin: Denies  abnormal skin rashes Lymphatics: Denies new lymphadenopathy or easy bruising Neurological:Denies numbness, tingling or new weaknesses Behavioral/Psych: Mood is stable, no new changes  All other systems were reviewed with the patient and are negative.  I have reviewed the past medical history, past surgical history, social history and family history with the patient and they  are unchanged from previous note.  ALLERGIES:  has No Known Allergies.  MEDICATIONS:  Current Outpatient Medications  Medication Sig Dispense Refill   acetaminophen (TYLENOL) 500 MG tablet Take 1,000 mg by mouth every 6 (six) hours as needed for moderate pain.     aspirin EC 81 MG tablet Take 1 tablet (81 mg total) by mouth daily. Swallow whole. 90 tablet 3   Cholecalciferol (DIALYVITE VITAMIN D 5000) 125 MCG (5000 UT) capsule Take 5,000 Units by mouth daily.     dexamethasone (DECADRON) 4 MG tablet Take 2 tabs at the night before and 2 tab the morning of chemotherapy, every 3 weeks, by mouth x 6 cycles 36 tablet 6   hydrOXYzine (VISTARIL) 25 MG capsule Take 1 capsule (25 mg total) by mouth 3 (three) times daily as needed. 30 capsule 2   ibuprofen (ADVIL) 200 MG tablet Take 400 mg by mouth every 6 (six) hours as needed for moderate pain.     levothyroxine (SYNTHROID) 125 MCG tablet Take 125 mcg by mouth every morning.     lidocaine-prilocaine (EMLA) cream Apply to affected area once 30 g 3   losartan (COZAAR) 25 MG tablet Take 0.5 tablets (12.5 mg total) by mouth at bedtime. 45 tablet 3   metoprolol succinate (TOPROL XL) 25 MG 24 hr tablet Take 0.5 tablets (12.5 mg total) by mouth at bedtime. 45 tablet 3   ondansetron (ZOFRAN) 8 MG tablet Take 1 tablet (8 mg total) by mouth every 8 (eight) hours as needed for nausea or vomiting. Start on the third day after chemotherapy. 30 tablet 1   oxyCODONE (OXY IR/ROXICODONE) 5 MG immediate release tablet Take 1 tablet (5 mg total) by mouth every 6 (six) hours as needed for severe pain. 30 tablet 0   pantoprazole (PROTONIX) 40 MG tablet Take 40 mg by mouth daily.     prochlorperazine (COMPAZINE) 10 MG tablet Take 1 tablet (10 mg total) by mouth every 6 (six) hours as needed for nausea or vomiting. 30 tablet 1   rosuvastatin (CRESTOR) 5 MG tablet Take 1 tablet (5 mg total) by mouth 3 (three) times a week. 30 tablet 3   vitamin B-12 (CYANOCOBALAMIN) 500 MCG  tablet Take 500 mcg by mouth daily.     No current facility-administered medications for this visit.    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 Small left pleural effusion  2. 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[place])   07/23/2022 Cancer Staging   Staging form: Ovary, Fallopian Tube, and Primary Peritoneal Carcinoma, AJCC 8th Edition - Clinical stage from 07/23/2022: FIGO Stage IIIC (cT3c, cN0, cM0) - Signed by Heath Lark, MD on 07/23/2022 Stage prefix: Initial diagnosis   07/23/2022 Pathology Results   FINAL MICROSCOPIC DIAGNOSIS:  - Malignant cells present  - See comment   SPECIMEN ADEQUACY:  Satisfactory for evaluation   DIAGNOSTIC COMMENTS:  Immunohistochemical stains show that the tumor cells are positive for ER, PAX8, p16 and p53 (clonal overexpression pattern), consistent with a high-grade serous carcinoma.  Immunostain for WT1 is  also diffusely positive in the tumor cells, most consistent with an ovarian primary.  Dr. Arby Barrette reviewed the case and concurs with the above diagnosis.    07/30/2022 Procedure   Successful right chest port placement via the right internal jugular vein. The port is ready for immediate use.   08/02/2022 -  Chemotherapy   Patient is on Treatment Plan : OVARIAN Carboplatin (AUC 6) + Paclitaxel (175) q21d X 6 Cycles     08/03/2022 Procedure   Successful ultrasound-guided paracentesis yielding 4.1 liters of peritoneal fluid.   08/09/2022 Tumor Marker   Patient's tumor was tested for the following markers: CA-125. Results of the tumor marker test revealed 452.   08/12/2022 Echocardiogram    1. Left ventricular ejection fraction, by estimation, is 40 to 45%. The left ventricle has mildly decreased function. The left ventricle demonstrates global hypokinesis. Left ventricular diastolic  parameters are indeterminate.   2. Right ventricular systolic function is normal. The right ventricular size is normal.   3. The mitral valve is normal in structure. Mild mitral valve regurgitation. No evidence of mitral stenosis.   4. The aortic valve is normal in structure. Aortic valve regurgitation is mild to moderate. Aortic valve sclerosis/calcification is present, without any evidence of aortic stenosis.   5. The inferior vena cava is normal in size with greater than 50% respiratory variability, suggesting right atrial pressure of 3 mmHg.      PHYSICAL EXAMINATION: ECOG PERFORMANCE STATUS: 1 - Symptomatic but completely ambulatory  Vitals:   09/20/22 1030  BP: 125/71  Pulse: (!) 102  Resp: 18  Temp: 97.9 F (36.6 C)  SpO2: 95%   Filed Weights   09/20/22 1030  Weight: 159 lb 3.2 oz (72.2 kg)    GENERAL:alert, no distress and comfortable   NEURO: alert & oriented x 3 with fluent speech, no focal motor/sensory deficits  LABORATORY DATA:  I have reviewed the data as listed    Component Value Date/Time   NA 136 09/20/2022 0948   K 4.1 09/20/2022 0948   CL 102 09/20/2022 0948   CO2 25 09/20/2022 0948   GLUCOSE 166 (H) 09/20/2022 0948   BUN 21 (H) 09/20/2022 0948   CREATININE 0.97 09/20/2022 0948   CALCIUM 9.9 09/20/2022 0948   PROT 9.0 (H) 09/20/2022 0948   ALBUMIN 3.8 09/20/2022 0948   AST 20 09/20/2022 0948   ALT 19 09/20/2022 0948   ALKPHOS 86 09/20/2022 0948   BILITOT 0.3 09/20/2022 0948   GFRNONAA >60 09/20/2022 0948    No results found for: "SPEP", "UPEP"  Lab Results  Component Value Date   WBC 7.0 09/20/2022   NEUTROABS 5.5 09/20/2022   HGB 10.5 (L) 09/20/2022   HCT 31.9 (L) 09/20/2022   MCV 85.5 09/20/2022   PLT 552 (H) 09/20/2022      Chemistry      Component Value Date/Time   NA 136 09/20/2022 0948   K 4.1 09/20/2022 0948   CL 102 09/20/2022 0948   CO2 25 09/20/2022 0948   BUN 21 (H) 09/20/2022 0948   CREATININE 0.97 09/20/2022 0948       Component Value Date/Time   CALCIUM 9.9 09/20/2022 0948   ALKPHOS 86 09/20/2022 0948   AST 20 09/20/2022 0948   ALT 19 09/20/2022 0948   BILITOT 0.3 09/20/2022 0948

## 2022-09-21 NOTE — Assessment & Plan Note (Signed)
she has mild peripheral neuropathy, likely related to side effects of treatment. It is only mild, not bothering the patient. I will observe for now If it gets worse in the future, I will consider modifying the dose of the treatment  

## 2022-09-21 NOTE — Assessment & Plan Note (Signed)
She has no clinical signs or symptoms of congestive heart failure She is scheduled for heart catheterization in January

## 2022-09-21 NOTE — Assessment & Plan Note (Signed)
This has resolved Observe only

## 2022-09-21 NOTE — Assessment & Plan Note (Signed)

## 2022-09-21 NOTE — Assessment & Plan Note (Signed)
This is the cause of her thrombocytosis Observe only

## 2022-09-22 ENCOUNTER — Encounter: Payer: Self-pay | Admitting: Hematology and Oncology

## 2022-10-05 ENCOUNTER — Encounter: Payer: Self-pay | Admitting: Hematology and Oncology

## 2022-10-06 ENCOUNTER — Ambulatory Visit (HOSPITAL_COMMUNITY): Payer: BC Managed Care – PPO

## 2022-10-06 ENCOUNTER — Ambulatory Visit (HOSPITAL_COMMUNITY)
Admission: RE | Admit: 2022-10-06 | Discharge: 2022-10-06 | Disposition: A | Discharge status: Home / Self Care | Payer: BC Managed Care – PPO | Source: Ambulatory Visit | Attending: Hematology and Oncology | Admitting: Hematology and Oncology

## 2022-10-06 ENCOUNTER — Encounter: Payer: Self-pay | Admitting: Gynecologic Oncology

## 2022-10-06 DIAGNOSIS — Z853 Personal history of malignant neoplasm of breast: Secondary | ICD-10-CM | POA: Diagnosis not present

## 2022-10-06 DIAGNOSIS — K5730 Diverticulosis of large intestine without perforation or abscess without bleeding: Secondary | ICD-10-CM | POA: Diagnosis not present

## 2022-10-06 DIAGNOSIS — N3289 Other specified disorders of bladder: Secondary | ICD-10-CM | POA: Diagnosis not present

## 2022-10-06 DIAGNOSIS — C482 Malignant neoplasm of peritoneum, unspecified: Secondary | ICD-10-CM | POA: Diagnosis not present

## 2022-10-06 DIAGNOSIS — Z8543 Personal history of malignant neoplasm of ovary: Secondary | ICD-10-CM | POA: Diagnosis not present

## 2022-10-06 DIAGNOSIS — S2241XA Multiple fractures of ribs, right side, initial encounter for closed fracture: Secondary | ICD-10-CM | POA: Diagnosis not present

## 2022-10-06 DIAGNOSIS — Z8572 Personal history of non-Hodgkin lymphomas: Secondary | ICD-10-CM | POA: Diagnosis not present

## 2022-10-06 DIAGNOSIS — J479 Bronchiectasis, uncomplicated: Secondary | ICD-10-CM | POA: Diagnosis not present

## 2022-10-06 MED ORDER — IOHEXOL 9 MG/ML PO SOLN
1000.0000 mL | Freq: Once | ORAL | Status: AC
  Administered 2022-10-06: 1000 mL via ORAL

## 2022-10-06 MED ORDER — HEPARIN SOD (PORK) LOCK FLUSH 100 UNIT/ML IV SOLN
500.0000 [IU] | Freq: Once | INTRAVENOUS | Status: AC
  Administered 2022-10-06: 500 [IU] via INTRAVENOUS

## 2022-10-06 MED ORDER — IOHEXOL 300 MG/ML  SOLN
100.0000 mL | Freq: Once | INTRAMUSCULAR | Status: AC | PRN
  Administered 2022-10-06: 100 mL via INTRAVENOUS

## 2022-10-06 MED ORDER — HEPARIN SOD (PORK) LOCK FLUSH 100 UNIT/ML IV SOLN
INTRAVENOUS | Status: AC
  Filled 2022-10-06: qty 5

## 2022-10-07 ENCOUNTER — Other Ambulatory Visit: Payer: Self-pay | Admitting: Hematology and Oncology

## 2022-10-07 ENCOUNTER — Telehealth: Payer: Self-pay

## 2022-10-07 DIAGNOSIS — N390 Urinary tract infection, site not specified: Secondary | ICD-10-CM

## 2022-10-07 NOTE — Telephone Encounter (Signed)
-----   Message from Heath Lark, MD sent at 10/07/2022  8:14 AM EST ----- She sees Dr. Berline Lopes tomorrow CT looks good but suggest she might have UTI Does she has symtpoms? Add lab appt for UA and UCx today or tomorrow, I will put in orders

## 2022-10-07 NOTE — Telephone Encounter (Signed)
Called Pt to relay below message. Pt reports pressure with urination and not feeling emptied each time. Pt aware of lab appt tomorrow at 1045.

## 2022-10-08 ENCOUNTER — Telehealth: Payer: Self-pay | Admitting: Oncology

## 2022-10-08 ENCOUNTER — Other Ambulatory Visit: Payer: Self-pay

## 2022-10-08 ENCOUNTER — Inpatient Hospital Stay: Payer: BC Managed Care – PPO

## 2022-10-08 ENCOUNTER — Encounter: Payer: Self-pay | Admitting: Gynecologic Oncology

## 2022-10-08 ENCOUNTER — Inpatient Hospital Stay: Payer: BC Managed Care – PPO | Attending: Gynecologic Oncology | Admitting: Gynecologic Oncology

## 2022-10-08 ENCOUNTER — Inpatient Hospital Stay (HOSPITAL_BASED_OUTPATIENT_CLINIC_OR_DEPARTMENT_OTHER): Payer: BC Managed Care – PPO | Admitting: Gynecologic Oncology

## 2022-10-08 VITALS — BP 118/64 | HR 103 | Temp 98.1°F | Resp 19 | Ht 60.0 in | Wt 158.4 lb

## 2022-10-08 DIAGNOSIS — C482 Malignant neoplasm of peritoneum, unspecified: Secondary | ICD-10-CM | POA: Diagnosis not present

## 2022-10-08 DIAGNOSIS — Z1509 Genetic susceptibility to other malignant neoplasm: Secondary | ICD-10-CM

## 2022-10-08 DIAGNOSIS — Z5111 Encounter for antineoplastic chemotherapy: Secondary | ICD-10-CM | POA: Diagnosis not present

## 2022-10-08 DIAGNOSIS — Z148 Genetic carrier of other disease: Secondary | ICD-10-CM | POA: Insufficient documentation

## 2022-10-08 DIAGNOSIS — G620 Drug-induced polyneuropathy: Secondary | ICD-10-CM | POA: Diagnosis not present

## 2022-10-08 DIAGNOSIS — Z1501 Genetic susceptibility to malignant neoplasm of breast: Secondary | ICD-10-CM

## 2022-10-08 DIAGNOSIS — R55 Syncope and collapse: Secondary | ICD-10-CM | POA: Diagnosis not present

## 2022-10-08 DIAGNOSIS — Z9013 Acquired absence of bilateral breasts and nipples: Secondary | ICD-10-CM | POA: Insufficient documentation

## 2022-10-08 DIAGNOSIS — N390 Urinary tract infection, site not specified: Secondary | ICD-10-CM

## 2022-10-08 DIAGNOSIS — K5900 Constipation, unspecified: Secondary | ICD-10-CM | POA: Diagnosis not present

## 2022-10-08 DIAGNOSIS — Z853 Personal history of malignant neoplasm of breast: Secondary | ICD-10-CM | POA: Insufficient documentation

## 2022-10-08 DIAGNOSIS — Z7189 Other specified counseling: Secondary | ICD-10-CM

## 2022-10-08 DIAGNOSIS — Z90722 Acquired absence of ovaries, bilateral: Secondary | ICD-10-CM | POA: Insufficient documentation

## 2022-10-08 DIAGNOSIS — I429 Cardiomyopathy, unspecified: Secondary | ICD-10-CM | POA: Insufficient documentation

## 2022-10-08 DIAGNOSIS — Z90710 Acquired absence of both cervix and uterus: Secondary | ICD-10-CM | POA: Insufficient documentation

## 2022-10-08 DIAGNOSIS — C786 Secondary malignant neoplasm of retroperitoneum and peritoneum: Secondary | ICD-10-CM

## 2022-10-08 DIAGNOSIS — R180 Malignant ascites: Secondary | ICD-10-CM

## 2022-10-08 LAB — URINALYSIS, COMPLETE (UACMP) WITH MICROSCOPIC
Bacteria, UA: NONE SEEN
Bilirubin Urine: NEGATIVE
Glucose, UA: NEGATIVE mg/dL
Ketones, ur: NEGATIVE mg/dL
Nitrite: NEGATIVE
Protein, ur: 100 mg/dL — AB
Specific Gravity, Urine: 1.019 (ref 1.005–1.030)
pH: 6 (ref 5.0–8.0)

## 2022-10-08 MED ORDER — ERYTHROMYCIN BASE 500 MG PO TABS
ORAL_TABLET | ORAL | 0 refills | Status: DC
  Filled 2022-11-10: qty 6, 1d supply, fill #0

## 2022-10-08 MED ORDER — BISACODYL 5 MG PO TBEC: DELAYED_RELEASE_TABLET | ORAL | 0 refills | Status: DC

## 2022-10-08 MED ORDER — POLYETHYLENE GLYCOL 3350 17 GM/SCOOP PO POWD: ORAL | 0 refills | Status: DC

## 2022-10-08 MED ORDER — NEOMYCIN SULFATE 500 MG PO TABS: ORAL_TABLET | ORAL | 0 refills | Status: DC

## 2022-10-08 MED ORDER — PEG 3350-KCL-NABCB-NACL-NASULF 236 G PO SOLR: 4000.0000 mL | Freq: Once | ORAL | 0 refills | Status: AC

## 2022-10-08 MED ORDER — SENNOSIDES-DOCUSATE SODIUM 8.6-50 MG PO TABS: 2.0000 | ORAL_TABLET | Freq: Every day | ORAL | 0 refills | Status: DC

## 2022-10-08 MED ORDER — TRAMADOL HCL 50 MG PO TABS: 50.0000 mg | ORAL_TABLET | Freq: Four times a day (QID) | ORAL | 0 refills | Status: DC | PRN

## 2022-10-08 MED FILL — Dexamethasone Sodium Phosphate Inj 100 MG/10ML: INTRAMUSCULAR | Qty: 1 | Status: AC

## 2022-10-08 MED FILL — Fosaprepitant Dimeglumine For IV Infusion 150 MG (Base Eq): INTRAVENOUS | Qty: 5 | Status: AC

## 2022-10-08 NOTE — Telephone Encounter (Signed)
I called the patient back.  We discussed what had been reviewed today.  I clarified that plan was for an additional cycle since we need to await cardiac clearance at the beginning of January.  Plan will be to delay surgery 4 weeks after this next cycle.  I apologize if there was any confusion in what was discussed at her appointment today.  She is understanding of this.  She was hoping to avoid chemotherapy around the holidays, but knows that our recommendation is to continue with 1 additional cycle as we plan for surgery next month.

## 2022-10-08 NOTE — Progress Notes (Signed)
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 Small left pleural effusion  2. 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[place])   07/23/2022 Cancer Staging   Staging form: Ovary, Fallopian Tube, and Primary Peritoneal Carcinoma, AJCC 8th Edition - Clinical stage from 07/23/2022: FIGO Stage IIIC (cT3c, cN0, cM0) - Signed by Heath Lark, MD on 07/23/2022 Stage prefix: Initial diagnosis   07/23/2022 Pathology Results   FINAL MICROSCOPIC DIAGNOSIS:  - Malignant cells present  - See comment   SPECIMEN ADEQUACY:  Satisfactory for evaluation   DIAGNOSTIC COMMENTS:  Immunohistochemical stains show that the tumor cells are positive for ER, PAX8, p16 and p53 (clonal overexpression pattern), consistent with a high-grade serous carcinoma.  Immunostain for WT1 is also diffusely positive in the tumor cells, most consistent with an ovarian primary.  Dr. Arby Barrette reviewed the case and concurs with the above diagnosis.    07/30/2022 Procedure   Successful right chest port placement via the right internal jugular vein. The port is ready for immediate use.   08/02/2022 -  Chemotherapy   Patient is on Treatment Plan : OVARIAN Carboplatin (AUC 6) + Paclitaxel (175) q21d X 6 Cycles     08/03/2022 Procedure   Successful ultrasound-guided paracentesis yielding 4.1 liters of peritoneal fluid.   08/09/2022 Tumor Marker   Patient's tumor was tested for the following markers: CA-125. Results of the tumor marker test revealed 452.   08/12/2022 Echocardiogram    1. Left ventricular ejection fraction, by estimation, is 40 to 45%. The left ventricle has mildly decreased function. The left ventricle demonstrates global hypokinesis. Left ventricular diastolic parameters are  indeterminate.   2. Right ventricular systolic function is normal. The right ventricular size is normal.   3. The mitral valve is normal in structure. Mild mitral valve regurgitation. No evidence of mitral stenosis.   4. The aortic valve is normal in structure. Aortic valve regurgitation is mild to moderate. Aortic valve sclerosis/calcification is present, without any evidence of aortic stenosis.   5. The inferior vena cava is normal in size with greater than 50% respiratory variability, suggesting right atrial pressure of 3 mmHg.    09/23/2022 Tumor Marker   Patient's tumor was tested for the following markers: CA-125. Results of the tumor marker test revealed 41.3.   10/07/2022 Imaging   1. Compared to the outside abdominopelvic CT of 07/19/2022, significant improvement in peritoneal carcinomatosis, without bowel obstruction or other acute complication. 2. Decreased size of upper abdominal nodes, possibly also representing response to therapy. 3. No new or progressive disease and no evidence of thoracic metastasis. 4. Bladder wall thickening and mucosal hyperenhancement are suspicious for cystitis. 5. 4 mm left upper lobe pulmonary nodule is most likely benign/incidental but can be re-evaluated at follow-up. 6. Aortic valvular calcifications. Consider echocardiography to evaluate for valvular dysfunction. 7. Coronary artery atherosclerosis. Aortic Atherosclerosis (ICD10-I70.0).       Interval History: Doing well.  Struggled some after her last cycle of chemotherapy.  Had 2 syncopal episodes, one that she thinks was related to dehydration, another while she was straining on the toilet to have a bowel movement.  She continues to endorse some fatigue.  Has some mild numbness in some of her fingertips.  Endorses a good appetite without nausea or emesis.  Bowel function other than around the time of  chemotherapy has been normal with the use of Senokot.  Past Medical/Surgical History: Past  Medical History:  Diagnosis Date   BRCA2 gene mutation positive 01/08/2019   Breast cancer ([place]) 2007   Left   Hodgkin's disease ([place]) 10/26/1987   clinical   Malignant tumor of breast ([place])    left   Thyroid dysfunction    2020, as results on Hodgkin's radiation    Past Surgical History:  Procedure Laterality Date   BREAST RECONSTRUCTION     2009   COLONOSCOPY     2017   excisional of bilateral breast     2007   Hysteroscopy w.biopsy     2012   IR IMAGING GUIDED PORT INSERTION  07/29/2022   IR PARACENTESIS  07/23/2022   KNEE ARTHROSCOPY     1985   laparoscopically assisted vaginal hysterectomy w/removal of tubes and/or ovaries  04/23/2019   LYMPH NODE BIOPSY  1989   MASTECTOMY Bilateral 2007   removal of spleen total     1989    Family History  Problem Relation Age of Onset   Prostate cancer Father    Prostate cancer Brother    Colon cancer Maternal Grandmother    Breast cancer Paternal Grandmother    Ovarian cancer Neg Hx    Endometrial cancer Neg Hx    Pancreatic cancer Neg Hx     Social History   Socioeconomic History   Marital status: Married    Spouse name: Not on file   Number of children: Not on file   Years of education: Not on file   Highest education level: Not on file  Occupational History   Occupation: retired  Tobacco Use   Smoking status: Never   Smokeless tobacco: Never  Substance and Sexual Activity   Alcohol use: Yes    Comment: rare   Drug use: Never   Sexual activity: Yes  Other Topics Concern   Not on file  Social History Narrative   Not on file   Social Determinants of Health   Financial Resource Strain: Low Risk  (08/02/2022)   Overall Financial Resource Strain (CARDIA)    Difficulty of Paying Living Expenses: Not very hard  Food Insecurity: No Food Insecurity (08/02/2022)   Hunger Vital Sign    Worried About Running Out of Food in the Last Year: Never true    Ran Out of Food in the Last Year: Never true  Transportation  Needs: No Transportation Needs (08/02/2022)   PRAPARE - Hydrologist (Medical): No    Lack of Transportation (Non-Medical): No  Physical Activity: Not on file  Stress: Not on file  Social Connections: Not on file    Current Medications:  Current Outpatient Medications:    acetaminophen (TYLENOL) 500 MG tablet, Take 1,000 mg by mouth every 6 (six) hours as needed for moderate pain., Disp: , Rfl:    aspirin EC 81 MG tablet, Take 1 tablet (81 mg total) by mouth daily. Swallow whole., Disp: 90 tablet, Rfl: 3   [START ON 11/10/2022] bisacodyl (DULCOLAX) 5 MG EC tablet, Swallow 4 dulcolax tablets with some water at 7 am the day before surgery, Disp: 5 tablet, Rfl: 0   Cholecalciferol (DIALYVITE VITAMIN D 5000) 125 MCG (5000 UT) capsule, Take 5,000 Units by mouth daily., Disp: , Rfl:    [START ON 11/10/2022] erythromycin base (E-MYCIN) 500 MG tablet, Plan to take two tablets (1000 mg total) at 2pm, 3 pm, and 10 pm the day  before surgery, Disp: 6 tablet, Rfl: 0   levothyroxine (SYNTHROID) 125 MCG tablet, Take 125 mcg by mouth every morning., Disp: , Rfl:    lidocaine-prilocaine (EMLA) cream, Apply to affected area once, Disp: 30 g, Rfl: 3   losartan (COZAAR) 25 MG tablet, Take 0.5 tablets (12.5 mg total) by mouth at bedtime., Disp: 45 tablet, Rfl: 3   metoprolol succinate (TOPROL XL) 25 MG 24 hr tablet, Take 0.5 tablets (12.5 mg total) by mouth at bedtime., Disp: 45 tablet, Rfl: 3   [START ON 11/10/2022] neomycin (MYCIFRADIN) 500 MG tablet, Plan to take two tablets (1000 mg total) at 2pm, 3 pm, and 10 pm the day before surgery, Disp: 6 tablet, Rfl: 0   pantoprazole (PROTONIX) 40 MG tablet, Take 40 mg by mouth daily., Disp: , Rfl:    [START ON 11/10/2022] polyethylene glycol (GOLYTELY) 236 g solution, Take 4,000 mLs by mouth once for 1 dose. Plan to begin drinking the day before surgery at 10 am, Disp: 4000 mL, Rfl: 0   rosuvastatin (CRESTOR) 5 MG tablet, Take 1 tablet (5 mg  total) by mouth 3 (three) times a week., Disp: 30 tablet, Rfl: 3   senna-docusate (SENOKOT-S) 8.6-50 MG tablet, Take 2 tablets by mouth at bedtime. For AFTER surgery, do not take if having diarrhea, Disp: 30 tablet, Rfl: 0   traMADol (ULTRAM) 50 MG tablet, Take 1 tablet (50 mg total) by mouth every 6 (six) hours as needed for severe pain. For AFTER surgery, do not take and drive, Disp: 10 tablet, Rfl: 0   vitamin B-12 (CYANOCOBALAMIN) 500 MCG tablet, Take 500 mcg by mouth daily., Disp: , Rfl:   Review of Systems: + Fatigue, numbness Denies appetite changes, fevers, chills, unexplained weight changes. Denies hearing loss, neck lumps or masses, mouth sores, ringing in ears or voice changes. Denies cough or wheezing.  Denies shortness of breath. Denies chest pain or palpitations. Denies leg swelling. Denies abdominal distention, pain, blood in stools, constipation, diarrhea, nausea, vomiting, or early satiety. Denies pain with intercourse, dysuria, frequency, hematuria or incontinence. Denies hot flashes, pelvic pain, vaginal bleeding or vaginal discharge.   Denies joint pain, back pain or muscle pain/cramps. Denies itching, rash, or wounds. Denies dizziness, headaches or seizures. Denies swollen lymph nodes or glands, denies easy bruising or bleeding. Denies anxiety, depression, confusion, or decreased concentration.  Physical Exam: BP 118/64 (BP Location: Left Arm, Patient Position: Sitting)   Pulse (!) 103   Temp 98.1 F (36.7 C) (Oral)   Resp 19   Ht 5' (1.524 m)   Wt 158 lb 7 oz (71.9 kg)   SpO2 98%   BMI 30.94 kg/m  General: Alert, oriented, no acute distress. HEENT: Normocephalic, atraumatic, sclera anicteric. Chest: Lungs clear to auscultation bilaterally, no wheezes or rhonchi. Cardiovascular: HR in 90s, regular rhythm, no murmurs. Abdomen: Obese, soft, nontender.  Normoactive bowel sounds.  No masses or hepatosplenomegaly appreciated.  Well-healed scar. Extremities: Grossly  normal range of motion.  Warm, well perfused.  No edema bilaterally. Skin: No rashes or lesions noted. Lymphatics: No cervical, supraclavicular, or inguinal adenopathy.  Laboratory & Radiologic Studies: CT C/A/P on 10/06/22: 1. Compared to the outside abdominopelvic CT of 07/19/2022, significant improvement in peritoneal carcinomatosis, without bowel obstruction or other acute complication. 2. Decreased size of upper abdominal nodes, possibly also representing response to therapy. 3. No new or progressive disease and no evidence of thoracic metastasis. 4. Bladder wall thickening and mucosal hyperenhancement are suspicious for cystitis. 5. 4 mm left  upper lobe pulmonary nodule is most likely benign/incidental but can be re-evaluated at follow-up. 6. Aortic valvular calcifications. Consider echocardiography to evaluate for valvular dysfunction. 7. Coronary artery atherosclerosis. Aortic Atherosclerosis (ICD10-I70.0).        Component Ref Range & Units 2 wk ago (09/20/22) 2 mo ago (07/30/22) 2 mo ago (07/23/22)  Cancer Antigen (CA) 125 0.0 - 38.1 U/mL 41.3 High  452.0 High  CM 490.0 High     Assessment & Plan: Lauren Chandler is a 56 y.o. 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  I reviewed with the patient and her husband more recent CT scan.  She has had evidence of disease response by imaging with significant decrease in her peritoneal and omental disease.  Additionally, her Ca1 25 has almost normalized.  Given the disease spread on CT scan, I recommend that we proceed with laparotomy for tumor debulking.  Plan will be for omentectomy, tumor debulking, and any other indicated procedures.  While I am hopeful that omentum can be removed from underlying transverse colon, there are some areas on CT imaging that make me concerned about proximity to the colon.  Plan for bowel prep in the event patient needs colonic  resection for R0 resection.  We also reviewed recent syncopal episodes and patient's scheduled cardiac catheterization at the beginning of January.  Given plan for major surgery, Dr. Alvy Bimler and I had discussed plan for fourth cycle of neoadjuvant chemotherapy so that we can delay surgery until after cardiac clearance.  I think this is the safest plan moving forward.  We have tentatively held time for surgery in mid January.  Given some thickening of bladder wall on recent CT scan, urine studies were sent today.  We reviewed the plan for a exploratory laparotomy, tumor debulking, and any other indicated procedures. The risks of surgery were discussed in detail and she understands these to include infection; wound separation; hernia; injury to adjacent organs such as bowel, bladder, blood vessels, ureters and nerves; bleeding which may require blood transfusion; anesthesia risk; thromboembolic events; possible death; unforeseen complications; possible need for re-exploration; medical complications such as heart attack, stroke, pleural effusion and pneumonia. The patient will receive DVT and antibiotic prophylaxis as indicated. She voiced a clear understanding. She had the opportunity to ask questions. Perioperative instructions were reviewed with her. Prescriptions for post-op medications were sent to her pharmacy of choice.  34 minutes of total time was spent for this patient encounter, including preparation, face-to-face counseling with the patient and coordination of care, and documentation of the encounter.  Jeral Pinch, MD  Division of Gynecologic Oncology  Department of Obstetrics and Gynecology  [facility]

## 2022-10-08 NOTE — Patient Instructions (Signed)
Proceed with your cardiac workup as planned.   Preparing for your Surgery   Plan for surgery on November 11, 2022 with Dr. Jeral Pinch at [facility] will be scheduled for exploratory laparotomy (larger incision on your abdomen), omentectomy (removal of the omentum), tumor debulking, and any other indicated procedures.    Pre-operative Testing -You will receive a phone call from presurgical testing at [facility] to arrange for a pre-operative appointment and lab work.   -Bring your insurance card, copy of an advanced directive if applicable, medication list   -At that visit, you will be asked to sign a consent for a possible blood transfusion in case a transfusion becomes necessary during surgery.  The need for a blood transfusion is rare but having consent is a necessary part of your care.      -You are fine to keep taking your baby aspirin 81 mg with your LAST DOSE BEING THE DAY BEFORE.   -Do not take supplements such as fish oil (omega 3), red yeast rice, turmeric before your surgery. You want to avoid medications with aspirin in them including headache powders such as BC or Goody's), Excedrin migraine.   Day Before Surgery at [facility]. You will be advised you can have clear liquids up until 3 hours before your surgery.     Your role in recovery Your role is to become active as soon as directed by your doctor, while still giving yourself time to heal.  Rest when you feel tired. You will be asked to do the following in order to speed your recovery:   - Cough and breathe deeply. This helps to clear and expand your lungs and can prevent pneumonia after surgery.  - [place]. Do mild physical activity. Walking or moving your legs help your circulation and body functions return to normal. Do not try to get up or walk alone the first time after surgery.   -If you develop swelling on one leg or the other, pain in the back of your leg,  redness/warmth in one of your legs, please call the office or go to the Emergency Room to have a doppler to rule out a blood clot. For shortness of breath, chest pain-seek care in the Emergency Room as soon as possible. - Actively manage your pain. Managing your pain lets you move in comfort. We will ask you to rate your pain on a scale of zero to 10. It is your responsibility to tell your doctor or nurse where and how much you hurt so your pain can be treated.   Special Considerations -If you are diabetic, you may be placed on insulin after surgery to have closer control over your blood sugars to promote healing and recovery.  This does not mean that you will be discharged on insulin.  If applicable, your oral antidiabetics will be resumed when you are tolerating a solid diet.   -Your final pathology results from surgery should be available around one week after surgery and the results will be relayed to you when available.   -FMLA forms can be faxed to [phone] and please allow 5-7 business days for completion.   Pain Management After Surgery -You have been prescribed your pain medication and bowel regimen medications before surgery so that you can have these available when you are discharged from the hospital. The pain medication is for use ONLY AFTER surgery and a new prescription will not be given.    -  Make sure that you have Tylenol and Ibuprofen IF YOU ARE ABLE TO TAKE THESE MEDICATIONS at home to use on a regular basis after surgery for pain control. We recommend alternating the medications every hour to six hours since they work differently and are processed in the body differently for pain relief.   -Review the attached handout on narcotic use and their risks and side effects.    Bowel Regimen -You have been prescribed Sennakot-S to take nightly to prevent constipation especially if you are taking the narcotic pain medication intermittently.  It is important to prevent constipation  and drink adequate amounts of liquids. You can stop taking this medication when you are not taking pain medication and you are back on your normal bowel routine.   Risks of Surgery Risks of surgery are low but include bleeding, infection, damage to surrounding structures, re-operation, blood clots, and very rarely death.     Blood Transfusion Information (For the consent to be signed before surgery)   We will be checking your blood type before surgery so in case of emergencies, we will know what type of blood you would need.                                             WHAT IS A BLOOD TRANSFUSION?   A transfusion is the replacement of blood or some of its parts. Blood is made up of multiple cells which provide different functions. Red blood cells carry oxygen and are used for blood loss replacement. White blood cells fight against infection. Platelets control bleeding. Plasma helps clot blood. Other blood products are available for specialized needs, such as hemophilia or other clotting disorders. BEFORE THE TRANSFUSION  Who gives blood for transfusions?  You may be able to donate blood to be used at a later date on yourself (autologous donation). Relatives can be asked to donate blood. This is generally not any safer than if you have received blood from a stranger. The same precautions are taken to ensure safety when a relative's blood is donated. Healthy volunteers who are fully evaluated to make sure their blood is safe. This is blood bank blood. Transfusion therapy is the safest it has ever been in the practice of medicine. Before blood is taken from a donor, a complete history is taken to make sure that person has no history of diseases nor engages in risky social behavior (examples are intravenous drug use or sexual activity with multiple partners). The donor's travel history is screened to minimize risk of transmitting infections, such as malaria. The donated blood is tested for signs of  infectious diseases, such as HIV and hepatitis. The blood is then tested to be sure it is compatible with you in order to minimize the chance of a transfusion reaction. If you or a relative donates blood, this is often done in anticipation of surgery and is not appropriate for emergency situations. It takes many days to process the donated blood. RISKS AND COMPLICATIONS Although transfusion therapy is very safe and saves many lives, the main dangers of transfusion include:  Getting an infectious disease. Developing a transfusion reaction. This is an allergic reaction to something in the blood you were given. Every precaution is taken to prevent this. The decision to have a blood transfusion has been considered carefully by your caregiver before blood is given. Blood is not given  unless the benefits outweigh the risks.   AFTER SURGERY INSTRUCTIONS   Return to work: 4-6 weeks if applicable   You will have a white honeycomb dressing over your larger incision. This dressing can be removed 5 days after surgery and you do not need to reapply a new dressing. Once you remove the dressing, you will notice that you have the surgical glue (dermabond) on the incision and this will peel off on its own. You can get this dressing wet in the shower the days after surgery prior to removal on the 5th day.    You will need to be on blood clot prevention medication for 4 weeks after surgery. This can be given in pill form or injections.   Activity: 1. Be up and out of the bed during the day.  Take a nap if needed.  You may walk up steps but be careful and use the hand rail.  Stair climbing will tire you more than you think, you may need to stop part way and rest.    2. No lifting or straining for 6 weeks over 10 pounds. No pushing, pulling, straining for 6 weeks.   3. No driving for around 4-69 days.  Do not drive if you are taking narcotic pain medicine and make sure that your reaction time has returned.    4.  You can shower as soon as the next day after surgery. Shower daily.  Use your regular soap and water (not directly on the incision) and pat your incision(s) dry afterwards; don't rub.  No tub baths or submerging your body in water until cleared by your surgeon. If you have the soap that was given to you by pre-surgical testing that was used before surgery, you do not need to use it afterwards because this can irritate your incisions.    5. No sexual activity and nothing in the vagina for 6 weeks.   6. You may experience a small amount of clear drainage from your incisions, which is normal.  If the drainage persists, increases, or changes color please call the office.   7. Do not use creams, lotions, or ointments such as neosporin on your incisions after surgery until advised by your surgeon because they can cause removal of the dermabond glue on your incisions.     8. Take Tylenol or ibuprofen (use sparingly since you will be on a blood thinner) first for pain if you are able to take these medications and only use narcotic pain medication for severe pain not relieved by the Tylenol or Ibuprofen.  Monitor your Tylenol intake to a max of 4,000 mg in a 24 hour period. You can alternate these medications after surgery.   Diet: 1. Low sodium Heart Healthy Diet is recommended but you are cleared to resume your normal (before surgery) diet after your procedure.   2. It is safe to use a laxative, such as Miralax or Colace, if you have difficulty moving your bowels. You have been prescribed Sennakot-S to take at bedtime every evening after surgery to keep bowel movements regular and to prevent constipation.     Wound Care: 1. Keep clean and dry.  Shower daily.   Reasons to call the Doctor: Fever - Oral temperature greater than 100.4 degrees Fahrenheit Foul-smelling vaginal discharge Difficulty urinating Nausea and vomiting Increased pain at the site of the incision that is unrelieved with pain  medicine. Difficulty breathing with or without chest pain New calf pain especially if only on one  side Sudden, continuing increased vaginal bleeding with or without clots.   Contacts: For questions or concerns you should contact:   Dr. Jeral Pinch at [phone]   Joylene John, NP at [phone]   After Hours: call [phone] and have the GYN Oncologist paged/contacted (after 5 pm or on the weekends).   Messages sent via mychart are for non-urgent matters and are not responded to after hours so for urgent needs, please call the after hours number.   COLON BOWEL PREP     FIVE DAYS PRIOR TO YOUR SURGERY   Obtain supplies for the bowel prep at a pharmacy of your choice: Office-e-mailed prescriptions for your antibiotic pills (Neomycin & Erythromycin)  Golytely- prescription sent A large bottle of Gatorade/Powerade (64 oz), avoid red/purple coloring- no prescription required             Dulcolax tablets (4 tablets)- prescription sent   Change your diet to make the bowel prep go more easily: Switch to a bland, low fiber diet Stop eating any nuts, popcorn, or fruit with seeds.  Stop all fiber supplements such as Metamucil, Miralax, etc.     Improve nutrition: Consider drinking 2-3 nutritional shakes (Ex: Ensure Surgery) every day, starting 5 days prior to surgery       DAY PRIOR TO SURGERY    Switch to a full liquid diet the day before surgery Drink plenty of liquids all day to avoid getting dehydrated      7:00 am Swallow 4 dulcolax tablets with some water   10:00 am Mix the Golytely container as directed and begin sipping  (8 oz glass every 15 minutes) until gone. (You should finish in 4 hours)   2:00pm Take 2 tablets of neomycin (total of 1000 mg) and 2 tablets of erythromycin   3:00pm Take 2 tablets of neomycin (total of 1000 mg) and 2 tablets of erythromycin Drink plenty of clear liquids all evening to avoid getting dehydrated   10:00pm Take 2 tablets  of neomycin (total of 1000 mg) and 2 tablets of erythromycin Drink 2 Carbohydrate loading nutrition drinks (ex: Ensure Presurgery). These will be given at pre-op appointment. Do not eat anything solid after bedtime (midnight) the night before your surgery.   BUT DO drink plenty of clear liquids (Water, Gatorade, juice, soda, coffee, tea, broths, etc.) up to 3 hours prior to surgery to avoid getting dehydrated.      MORNING OF SURGERY   Remember to not to eat anything solid that morning  Drink one final carbohydrate loading nutritional drink (ex: Ensure Presurgery) upon waking up in the morning (needs to be 3 hours before your surgery). Hold or take medications as recommended by the hospital staff at your Preoperative visit Stop drinking liquids before you leave the house (>3 hours prior to surgery)       If you have questions or concerns, please call GYN Oncology Office at [phone] during business hours to speak to the clinical staff for advice.       WHAT DO I NEED TO KNOW ABOUT A FULL LIQUID DIET? -You may have any liquid. -You may have any food that becomes a liquid at room temperature. The food is considered a liquid if it can be poured off a spoon at room temperature. WHAT FOODS CAN I EAT? -Grains: Any grain food that can be pureed in soup (such as crackers, pasta, and rice). Hot cereal (such as farina or oatmeal) that has been blended.  -Fruits: Fruit juice, including nectars  and juices. -Meats and Other Protein Sources: Eggs in custard, eggnog mix, and eggs used in ice cream or pudding. Strained meats, like in baby food, may be allowed. Consult your health care provider.  -Dairy: Milk and milk-based beverages, including milk shakes and instant breakfast mixes. Smooth yogurt. Pureed cottage cheese. Avoid these foods if they are not well tolerated. -Beverages: All beverages, including liquid nutritional supplements. AVOID CARBONATED BEVERAGES. -Condiments: Iodized salt, pepper,  spices, and flavorings. Cocoa powder. Vinegar, ketchup, yellow mustard, smooth sauces (such as hollandaise, cheese sauce, or white sauce), and soy sauce. -Sweets and Desserts: Custard, smooth pudding. Flavored gelatin. Tapioca, junket. Plain ice cream, sherbet, fruit ices. Frozen ice pops, frozen fudge pops, pudding pops, and other frozen bars with cream. Syrups, including chocolate syrup. Sugar, honey, jelly.  -Fats and Oils: Margarine, butter, cream, sour cream, and oils. -Other: Broth and cream soups. Strained, broth-based soups. The items listed above may not be a complete list of recommended foods or beverages.  WHAT FOODS CAN I NOT EAT? Grains: All breads. Grains are not allowed unless they are pureed into soup. Vegetables: No raw vegetables. Vegetables are not allowed unless they are juiced, or cooked and pureed into soup. Fruits: Fruits are not allowed unless they are juiced. Meats and Other Protein Sources: Any meat or fish. Cooked or raw eggs. Nut butters.  Dairy: Cheese.  Condiments: Stone ground mustards. Fats and Oils: Fats that are coarse or chunky. Sweets and Desserts: Ice cream or other frozen desserts that have any solids in them or on top, such as nuts, chocolate chips, and pieces of cookies. Cakes. Cookies. Candy. Others: Soups with chunks or pieces in them.

## 2022-10-08 NOTE — Telephone Encounter (Signed)
Called Lauren Chandler and advised that she will need to have chemotherapy on 10/11/22 per Dr. Berline Lopes so that she will not have a delay in treatment since surgery will be on 11/11/22.  Lauren Chandler is questioning why she would need it since she was told she wouldn't have to have it.  Again explained it is so she does not have a long delay in treatment and so that she can have her cardiac workup completed.  She was very tearful and asked if Dr. Berline Lopes could call her.

## 2022-10-08 NOTE — Progress Notes (Unsigned)
Patient here with her husband for follow up with Dr. Berline Lopes and for a pre-operative appointment prior to her scheduled surgery on November 11, 2022. She is scheduled for exploratory laparotomy, omentectomy, tumor debulking. She has her pre-admission testing appointment this am at [facility].  The surgery was discussed in detail.  See after visit summary for additional details. Visual aids used to discuss items related to surgery including the incentive spirometer, sequential compression stockings, foley catheter, IV pump, multi-modal pain regimen including tylenol, photo of the surgical robot, female reproductive system to discuss surgery in detail.      Discussed post-op pain management in detail including the aspects of the enhanced recovery pathway.  Advised her that a new prescription would be sent in for tramadol and it is only to be used for after her upcoming surgery.  We discussed the use of tylenol post-op and to monitor for a maximum of 4,000 mg in a 24 hour period.  Also prescribed sennakot to be used after surgery and to hold if having loose stools.  Discussed bowel regimen in detail.     Discussed measures to take at home to prevent DVT including frequent mobility.  Reportable signs and symptoms of DVT discussed. Post-operative instructions discussed and expectations for after surgery. Incisional care discussed as well including reportable signs and symptoms including erythema, drainage, wound separation.     10 minutes spent with the patient.  Verbalizing understanding of material discussed. No needs or concerns voiced at the end of the visit.   Advised patient and family to call for any needs.  Advised that her pre-op and post-operative medications had been prescribed and could be picked up at any time.    This appointment is included in the global surgical bundle as pre-operative teaching and has no charge.

## 2022-10-08 NOTE — Patient Instructions (Addendum)
Proceed with your cardiac workup as planned.  Preparing for your Surgery  Plan for surgery on November 11, 2022 with Dr. Jeral Pinch at [place] will be scheduled for exploratory laparotomy (larger incision on your abdomen), omentectomy (removal of the omentum), tumor debulking, and any other indicated procedures.   Pre-operative Testing -You will receive a phone call from presurgical testing at [facility] to arrange for a pre-operative appointment and lab work.  -Bring your insurance card, copy of an advanced directive if applicable, medication list  -At that visit, you will be asked to sign a consent for a possible blood transfusion in case a transfusion becomes necessary during surgery.  The need for a blood transfusion is rare but having consent is a necessary part of your care.     -You are fine to keep taking your baby aspirin 81 mg with your LAST DOSE BEING THE DAY BEFORE.  -Do not take supplements such as fish oil (omega 3), red yeast rice, turmeric before your surgery. You want to avoid medications with aspirin in them including headache powders such as BC or Goody's), Excedrin migraine.  Day Before Surgery at [place]. You will be advised you can have clear liquids up until 3 hours before your surgery.    Your role in recovery Your role is to become active as soon as directed by your doctor, while still giving yourself time to heal.  Rest when you feel tired. You will be asked to do the following in order to speed your recovery:  - Cough and breathe deeply. This helps to clear and expand your lungs and can prevent pneumonia after surgery.  - [place]. Do mild physical activity. Walking or moving your legs help your circulation and body functions return to normal. Do not try to get up or walk alone the first time after surgery.   -If you develop swelling on one leg or the other, pain in the back of your leg,  redness/warmth in one of your legs, please call the office or go to the Emergency Room to have a doppler to rule out a blood clot. For shortness of breath, chest pain-seek care in the Emergency Room as soon as possible. - Actively manage your pain. Managing your pain lets you move in comfort. We will ask you to rate your pain on a scale of zero to 10. It is your responsibility to tell your doctor or nurse where and how much you hurt so your pain can be treated.  Special Considerations -If you are diabetic, you may be placed on insulin after surgery to have closer control over your blood sugars to promote healing and recovery.  This does not mean that you will be discharged on insulin.  If applicable, your oral antidiabetics will be resumed when you are tolerating a solid diet.  -Your final pathology results from surgery should be available around one week after surgery and the results will be relayed to you when available.  -FMLA forms can be faxed to [phone] and please allow 5-7 business days for completion.  Pain Management After Surgery -You have been prescribed your pain medication and bowel regimen medications before surgery so that you can have these available when you are discharged from the hospital. The pain medication is for use ONLY AFTER surgery and a new prescription will not be given.   -Make sure that you have Tylenol and Ibuprofen IF YOU ARE ABLE TO  TAKE THESE MEDICATIONS at home to use on a regular basis after surgery for pain control. We recommend alternating the medications every hour to six hours since they work differently and are processed in the body differently for pain relief.  -Review the attached handout on narcotic use and their risks and side effects.   Bowel Regimen -You have been prescribed Sennakot-S to take nightly to prevent constipation especially if you are taking the narcotic pain medication intermittently.  It is important to prevent constipation and drink  adequate amounts of liquids. You can stop taking this medication when you are not taking pain medication and you are back on your normal bowel routine.  Risks of Surgery Risks of surgery are low but include bleeding, infection, damage to surrounding structures, re-operation, blood clots, and very rarely death.   Blood Transfusion Information (For the consent to be signed before surgery)  We will be checking your blood type before surgery so in case of emergencies, we will know what type of blood you would need.                                            WHAT IS A BLOOD TRANSFUSION?  A transfusion is the replacement of blood or some of its parts. Blood is made up of multiple cells which provide different functions. Red blood cells carry oxygen and are used for blood loss replacement. White blood cells fight against infection. Platelets control bleeding. Plasma helps clot blood. Other blood products are available for specialized needs, such as hemophilia or other clotting disorders. BEFORE THE TRANSFUSION  Who gives blood for transfusions?  You may be able to donate blood to be used at a later date on yourself (autologous donation). Relatives can be asked to donate blood. This is generally not any safer than if you have received blood from a stranger. The same precautions are taken to ensure safety when a relative's blood is donated. Healthy volunteers who are fully evaluated to make sure their blood is safe. This is blood bank blood. Transfusion therapy is the safest it has ever been in the practice of medicine. Before blood is taken from a donor, a complete history is taken to make sure that person has no history of diseases nor engages in risky social behavior (examples are intravenous drug use or sexual activity with multiple partners). The donor's travel history is screened to minimize risk of transmitting infections, such as malaria. The donated blood is tested for signs of infectious  diseases, such as HIV and hepatitis. The blood is then tested to be sure it is compatible with you in order to minimize the chance of a transfusion reaction. If you or a relative donates blood, this is often done in anticipation of surgery and is not appropriate for emergency situations. It takes many days to process the donated blood. RISKS AND COMPLICATIONS Although transfusion therapy is very safe and saves many lives, the main dangers of transfusion include:  Getting an infectious disease. Developing a transfusion reaction. This is an allergic reaction to something in the blood you were given. Every precaution is taken to prevent this. The decision to have a blood transfusion has been considered carefully by your caregiver before blood is given. Blood is not given unless the benefits outweigh the risks.  AFTER SURGERY INSTRUCTIONS  Return to work: 4-6 weeks if applicable  You will  have a white honeycomb dressing over your larger incision. This dressing can be removed 5 days after surgery and you do not need to reapply a new dressing. Once you remove the dressing, you will notice that you have the surgical glue (dermabond) on the incision and this will peel off on its own. You can get this dressing wet in the shower the days after surgery prior to removal on the 5th day.   You will need to be on blood clot prevention medication for 4 weeks after surgery. This can be given in pill form or injections.  Activity: 1. Be up and out of the bed during the day.  Take a nap if needed.  You may walk up steps but be careful and use the hand rail.  Stair climbing will tire you more than you think, you may need to stop part way and rest.   2. No lifting or straining for 6 weeks over 10 pounds. No pushing, pulling, straining for 6 weeks.  3. No driving for around 3-41 days.  Do not drive if you are taking narcotic pain medicine and make sure that your reaction time has returned.   4. You can shower as  soon as the next day after surgery. Shower daily.  Use your regular soap and water (not directly on the incision) and pat your incision(s) dry afterwards; don't rub.  No tub baths or submerging your body in water until cleared by your surgeon. If you have the soap that was given to you by pre-surgical testing that was used before surgery, you do not need to use it afterwards because this can irritate your incisions.   5. No sexual activity and nothing in the vagina for 6 weeks.  6. You may experience a small amount of clear drainage from your incisions, which is normal.  If the drainage persists, increases, or changes color please call the office.  7. Do not use creams, lotions, or ointments such as neosporin on your incisions after surgery until advised by your surgeon because they can cause removal of the dermabond glue on your incisions.    8. Take Tylenol or ibuprofen (use sparingly since you will be on a blood thinner) first for pain if you are able to take these medications and only use narcotic pain medication for severe pain not relieved by the Tylenol or Ibuprofen.  Monitor your Tylenol intake to a max of 4,000 mg in a 24 hour period. You can alternate these medications after surgery.  Diet: 1. Low sodium Heart Healthy Diet is recommended but you are cleared to resume your normal (before surgery) diet after your procedure.  2. It is safe to use a laxative, such as Miralax or Colace, if you have difficulty moving your bowels. You have been prescribed Sennakot-S to take at bedtime every evening after surgery to keep bowel movements regular and to prevent constipation.    Wound Care: 1. Keep clean and dry.  Shower daily.  Reasons to call the Doctor: Fever - Oral temperature greater than 100.4 degrees Fahrenheit Foul-smelling vaginal discharge Difficulty urinating Nausea and vomiting Increased pain at the site of the incision that is unrelieved with pain medicine. Difficulty breathing  with or without chest pain New calf pain especially if only on one side Sudden, continuing increased vaginal bleeding with or without clots.   Contacts: For questions or concerns you should contact:  Dr. Jeral Pinch at [phone]  Joylene John, NP at [phone]  After Hours: call [phone]  and have the GYN Oncologist paged/contacted (after 5 pm or on the weekends).  Messages sent via mychart are for non-urgent matters and are not responded to after hours so for urgent needs, please call the after hours number.  [facility]ON BOWEL PREP   FIVE DAYS PRIOR TO YOUR SURGERY  Obtain supplies for the bowel prep at a pharmacy of your choice: Office-e-mailed prescriptions for your antibiotic pills (Neomycin & Erythromycin)  Golytely- prescription sent A large bottle of Gatorade/Powerade (64 oz), avoid red/purple coloring- no prescription required             Dulcolax tablets (4 tablets)- prescription sent  Change your diet to make the bowel prep go more easily: Switch to a bland, low fiber diet Stop eating any nuts, popcorn, or fruit with seeds.  Stop all fiber supplements such as Metamucil, Miralax, etc.    Improve nutrition: Consider drinking 2-3 nutritional shakes (Ex: Ensure Surgery) every day, starting 5 days prior to surgery      DAY PRIOR TO SURGERY    Switch to a full liquid diet the day before surgery Drink plenty of liquids all day to avoid getting dehydrated      7:00 am Swallow 4 dulcolax tablets with some water   10:00 am Mix the Golytely container as directed and begin sipping  (8 oz glass every 15 minutes) until gone. (You should finish in 4 hours)   2:00pm Take 2 tablets of neomycin (total of 1000 mg) and 2 tablets of erythromycin   3:00pm Take 2 tablets of neomycin (total of 1000 mg) and 2 tablets of erythromycin Drink plenty of clear liquids all evening to avoid getting dehydrated   10:00pm Take 2 tablets of neomycin (total of 1000 mg) and 2  tablets of erythromycin Drink 2 Carbohydrate loading nutrition drinks (ex: Ensure Presurgery). These will be given at pre-op appointment. Do not eat anything solid after bedtime (midnight) the night before your surgery.   BUT DO drink plenty of clear liquids (Water, Gatorade, juice, soda, coffee, tea, broths, etc.) up to 3 hours prior to surgery to avoid getting dehydrated.    MORNING OF SURGERY  Remember to not to eat anything solid that morning  Drink one final carbohydrate loading nutritional drink (ex: Ensure Presurgery) upon waking up in the morning (needs to be 3 hours before your surgery). Hold or take medications as recommended by the hospital staff at your Preoperative visit Stop drinking liquids before you leave the house (>3 hours prior to surgery)    If you have questions or concerns, please call GYN Oncology Office at [phone] during business hours to speak to the clinical staff for advice.    WHAT DO I NEED TO KNOW ABOUT A FULL LIQUID DIET? -You may have any liquid. -You may have any food that becomes a liquid at room temperature. The food is considered a liquid if it can be poured off a spoon at room temperature. WHAT FOODS CAN I EAT? -Grains: Any grain food that can be pureed in soup (such as crackers, pasta, and rice). Hot cereal (such as farina or oatmeal) that has been blended.  -Fruits: Fruit juice, including nectars and juices. -Meats and Other Protein Sources: Eggs in custard, eggnog mix, and eggs used in ice cream or pudding. Strained meats, like in baby food, may be allowed. Consult your health care provider.  -Dairy: Milk and milk-based beverages, including milk shakes and instant breakfast mixes. Smooth yogurt. Pureed cottage cheese. Avoid these foods if they  are not well tolerated. -Beverages: All beverages, including liquid nutritional supplements. AVOID CARBONATED BEVERAGES. -Condiments: Iodized salt, pepper, spices, and flavorings. Cocoa powder.  Vinegar, ketchup, yellow mustard, smooth sauces (such as hollandaise, cheese sauce, or white sauce), and soy sauce. -Sweets and Desserts: Custard, smooth pudding. Flavored gelatin. Tapioca, junket. Plain ice cream, sherbet, fruit ices. Frozen ice pops, frozen fudge pops, pudding pops, and other frozen bars with cream. Syrups, including chocolate syrup. Sugar, honey, jelly.  -Fats and Oils: Margarine, butter, cream, sour cream, and oils. -Other: Broth and cream soups. Strained, broth-based soups. The items listed above may not be a complete list of recommended foods or beverages.  WHAT FOODS CAN I NOT EAT? Grains: All breads. Grains are not allowed unless they are pureed into soup. Vegetables: No raw vegetables. Vegetables are not allowed unless they are juiced, or cooked and pureed into soup. Fruits: Fruits are not allowed unless they are juiced. Meats and Other Protein Sources: Any meat or fish. Cooked or raw eggs. Nut butters.  Dairy: Cheese.  Condiments: Stone ground mustards. Fats and Oils: Fats that are coarse or chunky. Sweets and Desserts: Ice cream or other frozen desserts that have any solids in them or on top, such as nuts, chocolate chips, and pieces of cookies. Cakes. Cookies. Candy. Others: Soups with chunks or pieces in them.

## 2022-10-09 LAB — URINE CULTURE: Culture: <10000 — AB

## 2022-10-11 ENCOUNTER — Inpatient Hospital Stay: Payer: BC Managed Care – PPO

## 2022-10-11 ENCOUNTER — Inpatient Hospital Stay (HOSPITAL_BASED_OUTPATIENT_CLINIC_OR_DEPARTMENT_OTHER): Payer: BC Managed Care – PPO | Admitting: Hematology and Oncology

## 2022-10-11 ENCOUNTER — Encounter: Payer: Self-pay | Admitting: Hematology and Oncology

## 2022-10-11 VITALS — BP 92/77 | HR 99 | Resp 17

## 2022-10-11 VITALS — BP 112/70 | HR 107 | Temp 97.4°F | Resp 18 | Ht 60.0 in | Wt 158.6 lb

## 2022-10-11 DIAGNOSIS — Z90722 Acquired absence of ovaries, bilateral: Secondary | ICD-10-CM | POA: Diagnosis not present

## 2022-10-11 DIAGNOSIS — G620 Drug-induced polyneuropathy: Secondary | ICD-10-CM

## 2022-10-11 DIAGNOSIS — Z853 Personal history of malignant neoplasm of breast: Secondary | ICD-10-CM | POA: Diagnosis not present

## 2022-10-11 DIAGNOSIS — I429 Cardiomyopathy, unspecified: Secondary | ICD-10-CM | POA: Diagnosis not present

## 2022-10-11 DIAGNOSIS — K5900 Constipation, unspecified: Secondary | ICD-10-CM | POA: Diagnosis not present

## 2022-10-11 DIAGNOSIS — C482 Malignant neoplasm of peritoneum, unspecified: Secondary | ICD-10-CM

## 2022-10-11 DIAGNOSIS — T451X5A Adverse effect of antineoplastic and immunosuppressive drugs, initial encounter: Secondary | ICD-10-CM

## 2022-10-11 DIAGNOSIS — K5909 Other constipation: Secondary | ICD-10-CM | POA: Diagnosis not present

## 2022-10-11 DIAGNOSIS — Z5111 Encounter for antineoplastic chemotherapy: Secondary | ICD-10-CM | POA: Diagnosis not present

## 2022-10-11 DIAGNOSIS — Z90710 Acquired absence of both cervix and uterus: Secondary | ICD-10-CM | POA: Diagnosis not present

## 2022-10-11 DIAGNOSIS — R55 Syncope and collapse: Secondary | ICD-10-CM | POA: Diagnosis not present

## 2022-10-11 DIAGNOSIS — Z9013 Acquired absence of bilateral breasts and nipples: Secondary | ICD-10-CM | POA: Diagnosis not present

## 2022-10-11 DIAGNOSIS — Z148 Genetic carrier of other disease: Secondary | ICD-10-CM | POA: Diagnosis not present

## 2022-10-11 LAB — CMP (CANCER CENTER ONLY)
ALT: 18 U/L (ref 0–44)
AST: 18 U/L (ref 15–41)
Albumin: 3.7 g/dL (ref 3.5–5.0)
Alkaline Phosphatase: 76 U/L (ref 38–126)
Anion gap: 9 (ref 5–15)
BUN: 23 mg/dL — ABNORMAL HIGH (ref 6–20)
CO2: 24 mmol/L (ref 22–32)
Calcium: 9.5 mg/dL (ref 8.9–10.3)
Chloride: 104 mmol/L (ref 98–111)
Creatinine: 1 mg/dL (ref 0.44–1.00)
GFR, Estimated: >60 mL/min (ref >60)
Glucose, Bld: 191 mg/dL — ABNORMAL HIGH (ref 70–99)
Potassium: 4 mmol/L (ref 3.5–5.1)
Sodium: 137 mmol/L (ref 135–145)
Total Bilirubin: 0.2 mg/dL — ABNORMAL LOW (ref 0.3–1.2)
Total Protein: 7.9 g/dL (ref 6.5–8.1)

## 2022-10-11 LAB — CBC WITH DIFFERENTIAL (CANCER CENTER ONLY)
Abs Immature Granulocytes: 0.05 10*3/uL (ref 0.00–0.07)
Basophils Absolute: 0 10*3/uL (ref 0.0–0.1)
Basophils Relative: 0 %
Eosinophils Absolute: 0 10*3/uL (ref 0.0–0.5)
Eosinophils Relative: 0 %
HCT: 27.9 % — ABNORMAL LOW (ref 36.0–46.0)
Hemoglobin: 9.6 g/dL — ABNORMAL LOW (ref 12.0–15.0)
Immature Granulocytes: 1 %
Lymphocytes Relative: 18 %
Lymphs Abs: 1.8 10*3/uL (ref 0.7–4.0)
MCH: 29.6 pg (ref 26.0–34.0)
MCHC: 34.4 g/dL (ref 30.0–36.0)
MCV: 86.1 fL (ref 80.0–100.0)
Monocytes Absolute: 0.2 10*3/uL (ref 0.1–1.0)
Monocytes Relative: 2 %
Neutro Abs: 7.9 10*3/uL — ABNORMAL HIGH (ref 1.7–7.7)
Neutrophils Relative %: 79 %
Platelet Count: 239 10*3/uL (ref 150–400)
RBC: 3.24 MIL/uL — ABNORMAL LOW (ref 3.87–5.11)
RDW: 23.5 % — ABNORMAL HIGH (ref 11.5–15.5)
WBC Count: 9.9 10*3/uL (ref 4.0–10.5)
nRBC: 0 % (ref 0.0–0.2)

## 2022-10-11 MED ORDER — SODIUM CHLORIDE 0.9 % IV SOLN
10.0000 mg | Freq: Once | INTRAVENOUS | Status: AC
  Administered 2022-10-11: 10 mg via INTRAVENOUS
  Filled 2022-10-11: qty 10

## 2022-10-11 MED ORDER — HEPARIN SOD (PORK) LOCK FLUSH 100 UNIT/ML IV SOLN
500.0000 [IU] | Freq: Once | INTRAVENOUS | Status: AC | PRN
  Administered 2022-10-11: 500 [IU]

## 2022-10-11 MED ORDER — CETIRIZINE HCL 10 MG/ML IV SOLN
10.0000 mg | Freq: Once | INTRAVENOUS | Status: AC
  Administered 2022-10-11: 10 mg via INTRAVENOUS
  Filled 2022-10-11: qty 1

## 2022-10-11 MED ORDER — SODIUM CHLORIDE 0.9 % IV SOLN
140.0000 mg/m2 | Freq: Once | INTRAVENOUS | Status: AC
  Administered 2022-10-11: 246 mg via INTRAVENOUS
  Filled 2022-10-11: qty 41

## 2022-10-11 MED ORDER — FAMOTIDINE IN NACL 20-0.9 MG/50ML-% IV SOLN
20.0000 mg | Freq: Once | INTRAVENOUS | Status: AC
  Administered 2022-10-11: 20 mg via INTRAVENOUS
  Filled 2022-10-11: qty 50

## 2022-10-11 MED ORDER — SODIUM CHLORIDE 0.9 % IV SOLN
577.8000 mg | Freq: Once | INTRAVENOUS | Status: AC
  Administered 2022-10-11: 580 mg via INTRAVENOUS
  Filled 2022-10-11: qty 58

## 2022-10-11 MED ORDER — SODIUM CHLORIDE 0.9% FLUSH
10.0000 mL | INTRAVENOUS | Status: DC | PRN
  Administered 2022-10-11: 10 mL

## 2022-10-11 MED ORDER — PALONOSETRON HCL INJECTION 0.25 MG/5ML
0.2500 mg | Freq: Once | INTRAVENOUS | Status: AC
  Administered 2022-10-11: 0.25 mg via INTRAVENOUS
  Filled 2022-10-11: qty 5

## 2022-10-11 MED ORDER — SODIUM CHLORIDE 0.9 % IV SOLN
150.0000 mg | Freq: Once | INTRAVENOUS | Status: AC
  Administered 2022-10-11: 150 mg via INTRAVENOUS
  Filled 2022-10-11: qty 150

## 2022-10-11 MED ORDER — SODIUM CHLORIDE 0.9 % IV SOLN: Freq: Once | INTRAVENOUS | Status: AC

## 2022-10-11 MED ORDER — SODIUM CHLORIDE 0.9% FLUSH
10.0000 mL | Freq: Once | INTRAVENOUS | Status: AC
  Administered 2022-10-11: 10 mL

## 2022-10-11 NOTE — Assessment & Plan Note (Signed)
she has mild peripheral neuropathy, likely related to side effects of treatment. °I plan to reduce the dose of treatment as outlined above.  °I explained to the patient the rationale of this strategy and reassured the patient it would not compromise the efficacy of treatment ° °

## 2022-10-11 NOTE — Assessment & Plan Note (Signed)
Her recent CT imaging show excellent response to therapy Per discussion with GYN surgeon, we will proceed with cycle 4 today She will be undergoing cardiac catheterization procedure early January followed by surgery 2 weeks later Within 3 to 4 weeks after interval debulking surgery, she will return here to resume chemotherapy After completion of chemotherapy, the patient would qualify for PARP inhibitor Due to neuropathy, I plan slight dose reduction of paclitaxel

## 2022-10-11 NOTE — Assessment & Plan Note (Signed)
We have extensive discussions about aggressive laxative within the first few days after chemo to avoid constipation

## 2022-10-11 NOTE — Assessment & Plan Note (Signed)
She had recent syncopal episode which I believe is due to vasovagal maneuvers She is scheduled for cardiac catheterization early January She will continue medical management

## 2022-10-11 NOTE — Progress Notes (Signed)
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[facility]) Her recent CT imaging show excellent response to therapy Per discussion with GYN surgeon, we will proceed with cycle 4 today She will be undergoing cardiac catheterization procedure early January followed by surgery 2 weeks later Within 3 to 4 weeks after interval debulking surgery, she will return here to resume chemotherapy After completion of chemotherapy, the patient would qualify for PARP inhibitor Due to neuropathy, I plan slight dose reduction of paclitaxel  Peripheral neuropathy due to chemotherapy [facility]) she has mild peripheral neuropathy, likely related to side effects of treatment. I plan to reduce the dose of treatment as outlined above.  I explained to the patient the rationale of this strategy and reassured the patient it would not compromise the efficacy of treatment   Other constipation We have extensive discussions about aggressive laxative within the first few days after chemo to avoid constipation  Cardiomyopathy [facility]) She had recent syncopal episode which I believe is due to vasovagal maneuvers She is scheduled for cardiac catheterization early January She will continue medical management  No orders of the defined types were placed in this encounter.   All questions were answered. The patient knows to call the clinic with any problems, questions or concerns. The total time spent in the appointment was 30 minutes encounter with patients including review of chart and various tests results, discussions about plan of care and coordination of care plan   Heath Lark, MD 10/11/2022 12:46 PM  INTERVAL HISTORY: Please see below for problem oriented charting. she returns for  treatment follow-up with her husband She had slight worsening neuropathy and 2 episodes of syncopal episode She has some mild injury near the left eye because of this She also has some constipation after treatment CT imaging is reviewed and plan for surgery next year is reviewed with the patient and her husband  REVIEW OF SYSTEMS:   Constitutional: Denies fevers, chills or abnormal weight loss Eyes: Denies blurriness of vision Ears, nose, mouth, throat, and face: Denies mucositis or sore throat Respiratory: Denies cough, dyspnea or wheezes Cardiovascular: Denies palpitation, chest discomfort or lower extremity swelling Skin: Denies abnormal skin rashes Lymphatics: Denies new lymphadenopathy or easy bruising Behavioral/Psych: Mood is stable, no new changes  All other systems were reviewed with the patient and are negative.  I have reviewed the past medical history, past surgical history, social history and family history with the patient and they are unchanged from previous note.  ALLERGIES:  has No Known Allergies.  MEDICATIONS:  Current Outpatient Medications  Medication Sig Dispense Refill   acetaminophen (TYLENOL) 500 MG tablet Take 1,000 mg by mouth every 6 (six) hours as needed for moderate pain.     aspirin EC 81 MG tablet Take 1 tablet (81 mg total) by mouth daily. Swallow whole. 90 tablet 3   [START ON 11/10/2022] bisacodyl (DULCOLAX) 5 MG EC tablet Swallow 4 dulcolax tablets with some water at 7 am the day before surgery 5 tablet 0   Cholecalciferol (DIALYVITE VITAMIN D 5000) 125 MCG (5000 UT) capsule Take 5,000 Units by mouth daily.     [START ON 11/10/2022] erythromycin base (E-MYCIN) 500 MG tablet Plan to take two tablets (1000 mg total) at 2pm, 3 pm, and 10 pm the day before  surgery 6 tablet 0   levothyroxine (SYNTHROID) 125 MCG tablet Take 125 mcg by mouth every morning.     lidocaine-prilocaine (EMLA) cream Apply to affected area once 30 g 3   losartan (COZAAR) 25 MG  tablet Take 0.5 tablets (12.5 mg total) by mouth at bedtime. 45 tablet 3   metoprolol succinate (TOPROL XL) 25 MG 24 hr tablet Take 0.5 tablets (12.5 mg total) by mouth at bedtime. 45 tablet 3   [START ON 11/10/2022] neomycin (MYCIFRADIN) 500 MG tablet Plan to take two tablets (1000 mg total) at 2pm, 3 pm, and 10 pm the day before surgery 6 tablet 0   pantoprazole (PROTONIX) 40 MG tablet Take 40 mg by mouth daily.     [START ON 11/10/2022] polyethylene glycol (GOLYTELY) 236 g solution Take 4,000 mLs by mouth once for 1 dose. Plan to begin drinking the day before surgery at 10 am 4000 mL 0   rosuvastatin (CRESTOR) 5 MG tablet Take 1 tablet (5 mg total) by mouth 3 (three) times a week. 30 tablet 3   senna-docusate (SENOKOT-S) 8.6-50 MG tablet Take 2 tablets by mouth at bedtime. For AFTER surgery, do not take if having diarrhea 30 tablet 0   traMADol (ULTRAM) 50 MG tablet Take 1 tablet (50 mg total) by mouth every 6 (six) hours as needed for severe pain. For AFTER surgery, do not take and drive 10 tablet 0   vitamin B-12 (CYANOCOBALAMIN) 500 MCG tablet Take 500 mcg by mouth daily.     No current facility-administered medications for this visit.   Facility-Administered Medications Ordered in Other Visits  Medication Dose Route Frequency Provider Last Rate Last Admin   CARBOplatin (PARAPLATIN) 580 mg in sodium chloride 0.9 % 250 mL chemo infusion  580 mg Intravenous Once Alvy Bimler, Mani Celestin, MD       heparin lock flush 100 unit/mL  500 Units Intracatheter Once PRN Alvy Bimler, Daliya Parchment, MD       PACLitaxel (TAXOL) 246 mg in sodium chloride 0.9 % 250 mL chemo infusion (> 62m/m2)  140 mg/m2 (Treatment Plan Recorded) Intravenous Once Nuala Chiles, MD       sodium chloride flush (NS) 0.9 % injection 10 mL  10 mL Intracatheter PRN GHeath Lark MD        SUMMARY OF ONCOLOGIC HISTORY: Oncology History  [place] peritoneal [place]  07/19/2022 Imaging   1. Small left pleural effusion  2. Extensive peritoneal  carcinomatosis with moderate ascites    07/23/2022 Initial Diagnosis   [place] peritoneal [place]   07/23/2022 Cancer Staging   Staging form: Ovary, Fallopian Tube, and Primary Peritoneal Carcinoma, AJCC 8th Edition - Clinical stage from 07/23/2022: FIGO Stage IIIC (cT3c, cN0, cM0) - Signed by GHeath Lark MD on 07/23/2022 Stage prefix: Initial diagnosis   07/23/2022 Pathology Results   FINAL MICROSCOPIC DIAGNOSIS:  - Malignant cells present  - See comment   SPECIMEN ADEQUACY:  Satisfactory for evaluation   DIAGNOSTIC COMMENTS:  Immunohistochemical stains show that the tumor cells are positive for ER, PAX8, p16 and p53 (clonal overexpression pattern), consistent with a high-grade serous carcinoma.  Immunostain for WT1 is also diffusely positive in the tumor cells, most consistent with an ovarian primary.  Dr. LArby Barrettereviewed the case and concurs with the above diagnosis.    07/30/2022 Procedure   Successful right chest port placement via the right internal jugular vein. The port is ready for immediate use.   08/02/2022 -  Chemotherapy   Patient is on Treatment Plan :  OVARIAN Carboplatin (AUC 6) + Paclitaxel (175) q21d X 6 Cycles     08/03/2022 Procedure   Successful ultrasound-guided paracentesis yielding 4.1 liters of peritoneal fluid.   08/09/2022 Tumor Marker   Patient's tumor was tested for the following markers: CA-125. Results of the tumor marker test revealed 452.   08/12/2022 Echocardiogram    1. Left ventricular ejection fraction, by estimation, is 40 to 45%. The left ventricle has mildly decreased function. The left ventricle demonstrates global hypokinesis. Left ventricular diastolic parameters are indeterminate.   2. Right ventricular systolic function is normal. The right ventricular size is normal.   3. The mitral valve is normal in structure. Mild mitral valve regurgitation. No evidence of mitral stenosis.   4. The aortic valve is normal in structure.  Aortic valve regurgitation is mild to moderate. Aortic valve sclerosis/calcification is present, without any evidence of aortic stenosis.   5. The inferior vena cava is normal in size with greater than 50% respiratory variability, suggesting right atrial pressure of 3 mmHg.    09/23/2022 Tumor Marker   Patient's tumor was tested for the following markers: CA-125. Results of the tumor marker test revealed 41.3.   10/07/2022 Imaging   1. Compared to the outside abdominopelvic CT of 07/19/2022, significant improvement in peritoneal carcinomatosis, without bowel obstruction or other acute complication. 2. Decreased size of upper abdominal nodes, possibly also representing response to therapy. 3. No new or progressive disease and no evidence of thoracic metastasis. 4. Bladder wall thickening and mucosal hyperenhancement are suspicious for cystitis. 5. 4 mm left upper lobe pulmonary nodule is most likely benign/incidental but can be re-evaluated at follow-up. 6. Aortic valvular calcifications. Consider echocardiography to evaluate for valvular dysfunction. 7. Coronary artery atherosclerosis. Aortic Atherosclerosis (ICD10-I70.0).       PHYSICAL EXAMINATION: ECOG PERFORMANCE STATUS: 1 - Symptomatic but completely ambulatory  Vitals:   10/11/22 1103  BP: 112/70  Pulse: (!) 107  Resp: 18  Temp: (!) 97.4 F (36.3 C)  SpO2: 98%   Filed Weights   10/11/22 1103  Weight: 158 lb 9.6 oz (71.9 kg)    GENERAL:alert, no distress and comfortable NEURO: alert & oriented x 3 with fluent speech, no focal motor/sensory deficits  LABORATORY DATA:  I have reviewed the data as listed    Component Value Date/Time   NA 137 10/11/2022 1039   K 4.0 10/11/2022 1039   CL 104 10/11/2022 1039   CO2 24 10/11/2022 1039   GLUCOSE 191 (H) 10/11/2022 1039   BUN 23 (H) 10/11/2022 1039   CREATININE 1.00 10/11/2022 1039   CALCIUM 9.5 10/11/2022 1039   PROT 7.9 10/11/2022 1039   ALBUMIN 3.7 10/11/2022 1039    AST 18 10/11/2022 1039   ALT 18 10/11/2022 1039   ALKPHOS 76 10/11/2022 1039   BILITOT 0.2 (L) 10/11/2022 1039   GFRNONAA >60 10/11/2022 1039    No results found for: "SPEP", "UPEP"  Lab Results  Component Value Date   WBC 9.9 10/11/2022   NEUTROABS 7.9 (H) 10/11/2022   HGB 9.6 (L) 10/11/2022   HCT 27.9 (L) 10/11/2022   MCV 86.1 10/11/2022   PLT 239 10/11/2022      Chemistry      Component Value Date/Time   NA 137 10/11/2022 1039   K 4.0 10/11/2022 1039   CL 104 10/11/2022 1039   CO2 24 10/11/2022 1039   BUN 23 (H) 10/11/2022 1039   CREATININE 1.00 10/11/2022 1039      Component Value Date/Time  CALCIUM 9.5 10/11/2022 1039   ALKPHOS 76 10/11/2022 1039   AST 18 10/11/2022 1039   ALT 18 10/11/2022 1039   BILITOT 0.2 (L) 10/11/2022 1039       RADIOGRAPHIC STUDIES: I have personally reviewed the radiological images as listed and agreed with the findings in the report. CT CHEST ABDOMEN PELVIS W CONTRAST  Result Date: 10/06/2022 CLINICAL DATA:  Ovarian cancer. Evaluate treatment response. Primary peritoneal carcinoma. Prior history includes breast cancer and Hodgkin's lymphoma. * Tracking Code: BO * EXAM: CT CHEST, ABDOMEN, AND PELVIS WITH CONTRAST TECHNIQUE: Multidetector CT imaging of the chest, abdomen and pelvis was performed following the standard protocol during bolus administration of intravenous contrast. RADIATION DOSE REDUCTION: This exam was performed according to the departmental dose-optimization program which includes automated exposure control, adjustment of the mA and/or kV according to patient size and/or use of iterative reconstruction technique. CONTRAST:  142m OMNIPAQUE IOHEXOL 300 MG/ML  SOLN COMPARISON:  Outside abdominopelvic CT 07/19/2022 from [facility]-report not available. Most recent chest CT 03/10/2009. FINDINGS: CT CHEST FINDINGS Cardiovascular: Right Port-A-Cath tip high right atrium. Aortic atherosclerosis. Normal heart size, without pericardial  effusion. Aortic valve calcification. Left circumflex and right coronary artery calcification. Mediastinum/Nodes: No mediastinal or hilar adenopathy. Calcified anterior mediastinal nodes are likely related to old granulomatous disease. No axillary or subpectoral adenopathy. Lungs/Pleura: No pleural fluid. Mild medial upper lung predominant architectural distortion and bronchiectasis may be radiation induced. Left upper lobe 4 mm nodule on 37/4 is not readily apparent on remote chest CT. Musculoskeletal: Bilateral breast implants. No acute osseous abnormality. Remote seventh lateral right rib fracture. CT ABDOMEN PELVIS FINDINGS Hepatobiliary: Nonspecific caudate lobe enlargement. No suspicious liver lesion or biliary abnormality. Pancreas: Normal, without mass or ductal dilatation. Spleen: Splenectomy. Adrenals/Urinary Tract: Normal adrenal glands. Normal kidneys, without hydronephrosis. The bladder is mildly thick walled with mucosal hyperenhancement. Stomach/Bowel: Normal stomach, without wall thickening. Extensive colonic diverticulosis. Normal terminal ileum. Normal small bowel. Vascular/Lymphatic: Aortic atherosclerosis. Small upper abdominal nodes are decreased in size. Example gastrohepatic ligament node of 3-4 mm on 49/2 measured 7 mm on 07/19/2022 exam. No pelvic sidewall adenopathy. Reproductive: Hysterectomy.  No adnexal mass. Other: Resolution of abdominopelvic fluid since the prior exam. Significant improvement in omental metastasis. For example, omental thickening and mild nodularity measure maximally 2.1 cm in the left side of the abdomen on 72/2. At the same level on the prior exam, more well-defined nodularity/thickening measures 4.2 cm. Persistent but improved peritoneal thickening within the pelvis, including on 103/2. No free intraperitoneal air. Musculoskeletal: Mild osteopenia. IMPRESSION: 1. Compared to the outside abdominopelvic CT of 07/19/2022, significant improvement in peritoneal  carcinomatosis, without bowel obstruction or other acute complication. 2. Decreased size of upper abdominal nodes, possibly also representing response to therapy. 3. No new or progressive disease and no evidence of thoracic metastasis. 4. Bladder wall thickening and mucosal hyperenhancement are suspicious for cystitis. 5. 4 mm left upper lobe pulmonary nodule is most likely benign/incidental but can be re-evaluated at follow-up. 6. Aortic valvular calcifications. Consider echocardiography to evaluate for valvular dysfunction. 7. Coronary artery atherosclerosis. Aortic Atherosclerosis (ICD10-I70.0). Electronically Signed   By: KAbigail MiyamotoM.D.   On: 10/06/2022 14:04

## 2022-10-12 ENCOUNTER — Telehealth: Payer: Self-pay

## 2022-10-12 ENCOUNTER — Encounter: Payer: Self-pay | Admitting: Hematology and Oncology

## 2022-10-12 NOTE — Telephone Encounter (Signed)
Called regarding question about recent urine culture. Told her per Dr. Alvy Bimler her urine culture was negative. She verbalized understanding.

## 2022-10-14 ENCOUNTER — Encounter: Payer: Self-pay | Admitting: Hematology and Oncology

## 2022-10-26 ENCOUNTER — Other Ambulatory Visit (HOSPITAL_COMMUNITY): Payer: Self-pay

## 2022-10-28 ENCOUNTER — Ambulatory Visit (HOSPITAL_COMMUNITY)
Admission: RE | Admit: 2022-10-28 | Discharge: 2022-10-28 | Disposition: A | Discharge status: Home / Self Care | Payer: BC Managed Care – PPO | Attending: Internal Medicine | Admitting: Internal Medicine

## 2022-10-28 ENCOUNTER — Other Ambulatory Visit: Payer: Self-pay

## 2022-10-28 ENCOUNTER — Encounter (HOSPITAL_COMMUNITY): Payer: Self-pay | Admitting: Internal Medicine

## 2022-10-28 ENCOUNTER — Encounter (HOSPITAL_COMMUNITY)
Admission: RE | Disposition: A | Discharge status: Home / Self Care | Payer: BC Managed Care – PPO | Source: Home / Self Care | Attending: Internal Medicine

## 2022-10-28 DIAGNOSIS — I2510 Atherosclerotic heart disease of native coronary artery without angina pectoris: Secondary | ICD-10-CM | POA: Insufficient documentation

## 2022-10-28 DIAGNOSIS — C482 Malignant neoplasm of peritoneum, unspecified: Secondary | ICD-10-CM | POA: Insufficient documentation

## 2022-10-28 DIAGNOSIS — Z853 Personal history of malignant neoplasm of breast: Secondary | ICD-10-CM | POA: Diagnosis not present

## 2022-10-28 DIAGNOSIS — Z79899 Other long term (current) drug therapy: Secondary | ICD-10-CM | POA: Insufficient documentation

## 2022-10-28 DIAGNOSIS — Z923 Personal history of irradiation: Secondary | ICD-10-CM | POA: Diagnosis not present

## 2022-10-28 DIAGNOSIS — Z9221 Personal history of antineoplastic chemotherapy: Secondary | ICD-10-CM | POA: Diagnosis not present

## 2022-10-28 DIAGNOSIS — I428 Other cardiomyopathies: Secondary | ICD-10-CM | POA: Diagnosis not present

## 2022-10-28 DIAGNOSIS — Z8571 Personal history of Hodgkin lymphoma: Secondary | ICD-10-CM | POA: Insufficient documentation

## 2022-10-28 DIAGNOSIS — Z7982 Long term (current) use of aspirin: Secondary | ICD-10-CM | POA: Diagnosis not present

## 2022-10-28 HISTORY — PX: RIGHT/LEFT HEART CATH AND CORONARY ANGIOGRAPHY: CATH118266

## 2022-10-28 LAB — POCT I-STAT EG7
Acid-Base Excess: 1 mmol/L (ref 0.0–2.0)
Acid-Base Excess: 1 mmol/L (ref 0.0–2.0)
Bicarbonate: 26.3 mmol/L (ref 20.0–28.0)
Bicarbonate: 26.3 mmol/L (ref 20.0–28.0)
Calcium, Ion: 1.16 mmol/L (ref 1.15–1.40)
Calcium, Ion: 1.15 mmol/L (ref 1.15–1.40)
HCT: 26 % — ABNORMAL LOW (ref 36.0–46.0)
HCT: 25 % — ABNORMAL LOW (ref 36.0–46.0)
Hemoglobin: 8.8 g/dL — ABNORMAL LOW (ref 12.0–15.0)
Hemoglobin: 8.5 g/dL — ABNORMAL LOW (ref 12.0–15.0)
O2 Saturation: 67 %
O2 Saturation: 58 %
Potassium: 3.9 mmol/L (ref 3.5–5.1)
Potassium: 3.9 mmol/L (ref 3.5–5.1)
Sodium: 139 mmol/L (ref 135–145)
Sodium: 140 mmol/L (ref 135–145)
TCO2: 28 mmol/L (ref 22–32)
TCO2: 28 mmol/L (ref 22–32)
pCO2, Ven: 45 mmHg (ref 44–60)
pCO2, Ven: 44.8 mmHg (ref 44–60)
pH, Ven: 7.374 (ref 7.25–7.43)
pH, Ven: 7.378 (ref 7.25–7.43)
pO2, Ven: 36 mmHg (ref 32–45)
pO2, Ven: 31 mmHg — CL (ref 32–45)

## 2022-10-28 LAB — POCT I-STAT 7, (LYTES, BLD GAS, ICA,H+H)
Acid-Base Excess: 0 mmol/L (ref 0.0–2.0)
Bicarbonate: 24.5 mmol/L (ref 20.0–28.0)
Calcium, Ion: 1.11 mmol/L — ABNORMAL LOW (ref 1.15–1.40)
HCT: 25 % — ABNORMAL LOW (ref 36.0–46.0)
Hemoglobin: 8.5 g/dL — ABNORMAL LOW (ref 12.0–15.0)
O2 Saturation: 96 %
Potassium: 3.8 mmol/L (ref 3.5–5.1)
Sodium: 139 mmol/L (ref 135–145)
TCO2: 26 mmol/L (ref 22–32)
pCO2 arterial: 37.8 mmHg (ref 32–48)
pH, Arterial: 7.42 (ref 7.35–7.45)
pO2, Arterial: 82 mmHg — ABNORMAL LOW (ref 83–108)

## 2022-10-28 SURGERY — RIGHT/LEFT HEART CATH AND CORONARY ANGIOGRAPHY
Anesthesia: LOCAL

## 2022-10-28 MED ORDER — HEPARIN (PORCINE) IN NACL 1000-0.9 UT/500ML-% IV SOLN
INTRAVENOUS | Status: DC | PRN
  Administered 2022-10-28 (×2): 500 mL

## 2022-10-28 MED ORDER — ONDANSETRON HCL 4 MG/2ML IJ SOLN: 4.0000 mg | Freq: Four times a day (QID) | INTRAMUSCULAR | Status: DC | PRN

## 2022-10-28 MED ORDER — SODIUM CHLORIDE 0.9 % IV SOLN: 250.0000 mL | INTRAVENOUS | Status: DC | PRN

## 2022-10-28 MED ORDER — HEPARIN SODIUM (PORCINE) 1000 UNIT/ML IJ SOLN
INTRAMUSCULAR | Status: AC
  Filled 2022-10-28: qty 10

## 2022-10-28 MED ORDER — HYDRALAZINE HCL 20 MG/ML IJ SOLN: 10.0000 mg | INTRAMUSCULAR | Status: DC | PRN

## 2022-10-28 MED ORDER — LABETALOL HCL 5 MG/ML IV SOLN: 10.0000 mg | INTRAVENOUS | Status: DC | PRN

## 2022-10-28 MED ORDER — LIDOCAINE HCL (PF) 1 % IJ SOLN
INTRAMUSCULAR | Status: AC
  Filled 2022-10-28: qty 30

## 2022-10-28 MED ORDER — SODIUM CHLORIDE 0.9 % IV SOLN: INTRAVENOUS | Status: DC

## 2022-10-28 MED ORDER — ACETAMINOPHEN 325 MG PO TABS: 650.0000 mg | ORAL_TABLET | ORAL | Status: DC | PRN

## 2022-10-28 MED ORDER — SODIUM CHLORIDE 0.9 % WEIGHT BASED INFUSION: 1.0000 mL/kg/h | INTRAVENOUS | Status: DC

## 2022-10-28 MED ORDER — FENTANYL CITRATE (PF) 100 MCG/2ML IJ SOLN
INTRAMUSCULAR | Status: DC | PRN
  Administered 2022-10-28: 25 ug via INTRAVENOUS

## 2022-10-28 MED ORDER — MIDAZOLAM HCL 2 MG/2ML IJ SOLN
INTRAMUSCULAR | Status: AC
  Filled 2022-10-28: qty 2

## 2022-10-28 MED ORDER — SODIUM CHLORIDE 0.9 % WEIGHT BASED INFUSION
3.0000 mL/kg/h | INTRAVENOUS | Status: AC
  Administered 2022-10-28: 3 mL/kg/h via INTRAVENOUS

## 2022-10-28 MED ORDER — SODIUM CHLORIDE 0.9% FLUSH: 3.0000 mL | Freq: Two times a day (BID) | INTRAVENOUS | Status: DC

## 2022-10-28 MED ORDER — HEPARIN SODIUM (PORCINE) 1000 UNIT/ML IJ SOLN
INTRAMUSCULAR | Status: DC | PRN
  Administered 2022-10-28: 5000 [IU] via INTRAVENOUS

## 2022-10-28 MED ORDER — MIDAZOLAM HCL 2 MG/2ML IJ SOLN
INTRAMUSCULAR | Status: DC | PRN
  Administered 2022-10-28: 1 mg via INTRAVENOUS

## 2022-10-28 MED ORDER — LIDOCAINE HCL (PF) 1 % IJ SOLN
INTRAMUSCULAR | Status: DC | PRN
  Administered 2022-10-28: 5 mL

## 2022-10-28 MED ORDER — ASPIRIN 81 MG PO CHEW
81.0000 mg | CHEWABLE_TABLET | ORAL | Status: AC
  Administered 2022-10-28: 81 mg via ORAL
  Filled 2022-10-28: qty 1

## 2022-10-28 MED ORDER — SODIUM CHLORIDE 0.9% FLUSH: 3.0000 mL | INTRAVENOUS | Status: DC | PRN

## 2022-10-28 MED ORDER — VERAPAMIL HCL 2.5 MG/ML IV SOLN
INTRAVENOUS | Status: AC
  Filled 2022-10-28: qty 2

## 2022-10-28 MED ORDER — HEPARIN (PORCINE) IN NACL 1000-0.9 UT/500ML-% IV SOLN
INTRAVENOUS | Status: AC
  Filled 2022-10-28: qty 1000

## 2022-10-28 MED ORDER — FENTANYL CITRATE (PF) 100 MCG/2ML IJ SOLN
INTRAMUSCULAR | Status: AC
  Filled 2022-10-28: qty 2

## 2022-10-28 MED ORDER — IOHEXOL 350 MG/ML SOLN
INTRAVENOUS | Status: DC | PRN
  Administered 2022-10-28: 50 mL via INTRA_ARTERIAL

## 2022-10-28 MED ORDER — VERAPAMIL HCL 2.5 MG/ML IV SOLN: INTRAVENOUS | Status: DC | PRN

## 2022-10-28 SURGICAL SUPPLY — 12 items
CATH DIAG 6FR PIGTAIL ANGLED (CATHETERS) IMPLANT
CATH OPTITORQUE TIG 4.0 6F (CATHETERS) IMPLANT
CATH SWAN GANZ 7F STRAIGHT (CATHETERS) IMPLANT
DEVICE RAD TR BAND REGULAR (VASCULAR PRODUCTS) IMPLANT
GLIDESHEATH SLEND SS 6F .021 (SHEATH) IMPLANT
GLIDESHEATH SLENDER 7FR .021G (SHEATH) IMPLANT
GUIDEWIRE INQWIRE 1.5J.035X260 (WIRE) IMPLANT
INQWIRE 1.5J .035X260CM (WIRE) ×1
KIT HEART LEFT (KITS) ×2 IMPLANT
PACK CARDIAC CATHETERIZATION (CUSTOM PROCEDURE TRAY) ×2 IMPLANT
TRANSDUCER W/STOPCOCK (MISCELLANEOUS) ×2 IMPLANT
TUBING CIL FLEX 10 FLL-RA (TUBING) ×2 IMPLANT

## 2022-10-28 NOTE — Progress Notes (Signed)
Patient and son was given discharge instructions. Both verbalized understanding. 

## 2022-10-28 NOTE — H&P (Signed)
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  She is referred for coronary angiography and right heart catheterization to characterize her cardiac hemodynamics and her cardiomyopathy.  In the clinic when she was being evaluated she was very tachycardic so coronary CTA was deferred.   Past Medical History:  Diagnosis Date   BRCA2 gene mutation positive 01/08/2019   Breast cancer ([place]) 2007   Left   Hodgkin's disease ([place]) 10/26/1987   clinical   Malignant tumor of breast ([place])    left   Thyroid dysfunction    2020, as results on Hodgkin's radiation    Past Surgical History:  Procedure Laterality Date   BREAST RECONSTRUCTION     2009   COLONOSCOPY     2017   excisional of bilateral breast     2007   Hysteroscopy w.biopsy     2012   IR IMAGING GUIDED PORT INSERTION  07/29/2022   IR PARACENTESIS  07/23/2022   KNEE ARTHROSCOPY     1985   laparoscopically assisted vaginal hysterectomy w/removal of tubes and/or ovaries  04/23/2019   LYMPH NODE BIOPSY  1989   MASTECTOMY Bilateral 2007   removal of spleen total     1989     Medications Prior to Admission: Prior to Admission medications   Medication Sig Start Date End Date Taking? Authorizing Provider  aspirin EC 81 MG tablet Take 1 tablet (81 mg total) by mouth daily. Swallow whole. Patient taking differently: Take 81 mg by mouth every evening. Swallow whole. 07/27/22  Yes Early Osmond, MD   Cholecalciferol (DIALYVITE VITAMIN D 5000) 125 MCG (5000 UT) capsule Take 5,000 Units by mouth every evening.   Yes [provider]  levothyroxine (SYNTHROID) 125 MCG tablet Take 125 mcg by mouth daily before breakfast. 06/30/22  Yes [provider]  lidocaine-prilocaine (EMLA) cream Apply to affected area once 07/23/22  Yes Gorsuch, Ni, MD  losartan (COZAAR) 25 MG tablet Take 0.5 tablets (12.5 mg total) by mouth at bedtime. 09/02/22  Yes Early Osmond, MD  metoprolol succinate (TOPROL XL) 25 MG 24 hr tablet Take 0.5 tablets (12.5 mg total) by mouth at bedtime. 09/02/22  Yes Early Osmond, MD  pantoprazole (PROTONIX) 40 MG tablet Take 40 mg by mouth daily before breakfast.   Yes [provider]  rosuvastatin (CRESTOR) 5 MG tablet Take 1 tablet (5 mg total) by mouth 3 (three) times a week. Patient taking differently: Take 5 mg by mouth every Monday, Wednesday, and Friday at 8 PM. In the evening. 09/06/22  Yes Early Osmond, MD  senna (SENOKOT) 8.6 MG tablet Take 1 tablet by mouth every evening.   Yes [provider]  vitamin B-12 (CYANOCOBALAMIN) 500 MCG tablet Take 500 mcg by mouth every evening.   Yes [provider]  acetaminophen (TYLENOL) 500 MG tablet Take 1,000 mg by mouth every 6 (six) hours as needed for moderate pain.    [provider]  bisacodyl (  DULCOLAX) 5 MG EC tablet Swallow 4 dulcolax tablets with some water at 7 am the day before surgery 11/10/22   Joylene John D, NP  erythromycin base (E-MYCIN) 500 MG tablet Plan to take two tablets (1000 mg total) at 2pm, 3 pm, and 10 pm the day before surgery 11/10/22   Joylene John D, NP  neomycin (MYCIFRADIN) 500 MG tablet Plan to take two tablets (1000 mg total) at 2pm, 3 pm, and 10 pm the day before surgery 11/10/22   Joylene John D, NP  polyethylene glycol (GOLYTELY) 236 g solution Take 4,000 mLs by mouth once for 1 dose. Plan to begin drinking the day before surgery at 10 am  11/10/22 11/10/22  Cross, Lenna Sciara D, NP  senna-docusate (SENOKOT-S) 8.6-50 MG tablet Take 2 tablets by mouth at bedtime. For AFTER surgery, do not take if having diarrhea 10/08/22   Cross, Lenna Sciara D, NP  traMADol (ULTRAM) 50 MG tablet Take 1 tablet (50 mg total) by mouth every 6 (six) hours as needed for severe pain. For AFTER surgery, do not take and drive 59/93/57   Joylene John D, NP     Allergies:   No Known Allergies  Social History:   Social History   Socioeconomic History   Marital status: Married    Spouse name: Not on file   Number of children: Not on file   Years of education: Not on file   Highest education level: Not on file  Occupational History   Occupation: retired  Tobacco Use   Smoking status: Never   Smokeless tobacco: Never  Substance and Sexual Activity   Alcohol use: Yes    Comment: rare   Drug use: Never   Sexual activity: Yes  Other Topics Concern   Not on file  Social History Narrative   Not on file   Social Determinants of Health   Financial Resource Strain: Low Risk  (08/02/2022)   Overall Financial Resource Strain (CARDIA)    Difficulty of Paying Living Expenses: Not very hard  Food Insecurity: No Food Insecurity (08/02/2022)   Hunger Vital Sign    Worried About Running Out of Food in the Last Year: Never true    Ran Out of Food in the Last Year: Never true  Transportation Needs: No Transportation Needs (08/02/2022)   PRAPARE - Hydrologist (Medical): No    Lack of Transportation (Non-Medical): No  Physical Activity: Not on file  Stress: Not on file  Social Connections: Not on file  Intimate Partner Violence: Not on file    Family History:   The patient's family history includes Breast cancer in her paternal grandmother; Colon cancer in her maternal grandmother; Prostate cancer in her brother and father. There is no history of Ovarian cancer, Endometrial cancer, or Pancreatic cancer.    ROS:  Please see the  history of present illness.  All other ROS reviewed and negative.     Physical Exam/Data:   Vitals:   10/28/22 0750  BP: 125/80  Pulse: 96  Resp: 18  Temp: 98.5 F (36.9 C)  TempSrc: Oral  SpO2: 96%  Weight: 72.6 kg  Height: _0  (1.6 m)   No intake or output data in the 24 hours ending 10/28/22 0819    10/28/2022    7:50 AM 10/11/2022   11:03 AM 10/08/2022   11:10 AM  Last 3 Weights  Weight (lbs) 160 lb 158 lb 9.6 oz 158 lb 7 oz  Weight (kg) 72.576  kg 71.94 kg 71.867 kg     Body mass index is 28.34 kg/m.  General:  Well nourished, well developed, in no acute distress HEENT: normal Neck: no JVD Vascular: No carotid bruits; Distal pulses 2+ bilaterally   Cardiac:  normal S1, S2; RRR; no murmur  Lungs:  clear to auscultation bilaterally, no wheezing, rhonchi or rales  Abd: soft, nontender, no hepatomegaly  Ext: no edema Musculoskeletal:  No deformities, BUE and BLE strength normal and equal Skin: warm and dry  Neuro:  CNs 2-12 intact, no focal abnormalities noted Psych:  Normal affect    EKG:  The ECG that was done January 2024 was personally reviewed and demonstrates sinus rhythm  Relevant CV Studies: TTE 2023:  1. Left ventricular ejection fraction, by estimation, is 40 to 45%. The  left ventricle has mildly decreased function. The left ventricle  demonstrates global hypokinesis. Left ventricular diastolic parameters are  indeterminate.   2. Right ventricular systolic function is normal. The right ventricular  size is normal.   3. The mitral valve is normal in structure. Mild mitral valve  regurgitation. No evidence of mitral stenosis.   4. The aortic valve is normal in structure. Aortic valve regurgitation is  mild to moderate. Aortic valve sclerosis/calcification is present, without  any evidence of aortic stenosis.   5. The inferior vena cava is normal in size with greater than 50%  respiratory variability, suggesting right atrial pressure of 3 mmHg.    Laboratory Data:  High Sensitivity Troponin:  No results for input(s): "TROPONINIHS" in the last 720 hours.    ChemistryNo results for input(s): "NA", "K", "CL", "CO2", "GLUCOSE", "BUN", "CREATININE", "CALCIUM", "MG", "GFRNONAA", "GFRAA", "ANIONGAP" in the last 168 hours.  No results for input(s): "PROT", "ALBUMIN", "AST", "ALT", "ALKPHOS", "BILITOT" in the last 168 hours. Lipids No results for input(s): "CHOL", "TRIG", "HDL", "LABVLDL", "LDLCALC", "CHOLHDL" in the last 168 hours. HematologyNo results for input(s): "WBC", "RBC", "HGB", "HCT", "MCV", "MCH", "MCHC", "RDW", "PLT" in the last 168 hours. Thyroid No results for input(s): "TSH", "FREET4" in the last 168 hours. BNPNo results for input(s): "BNP", "PROBNP" in the last 168 hours.  DDimer No results for input(s): "DDIMER" in the last 168 hours.   Radiology/Studies:  No results found.   Assessment and Plan:   New cardiomyopathy: I suspect this is nonischemic.  We were unable to get a coronary CTA due to his very exuberant sinus tachycardia previously.  Will refer the patient for coronary angiography and right heart catheterization to characterize the patient's cardiomyopathy and assess her hemodynamics.    I have reviewed the risks, indications, and alternatives to cardiac catheterization, possible angioplasty, and stenting with the patient. Risks include but are not limited to bleeding, infection, vascular injury, stroke, myocardial infection, arrhythmia, kidney injury, radiation-related injury in the case of prolonged fluoroscopy use, emergency cardiac surgery, and death. The patient understands the risks of serious complication is 1-2 in 2671 with diagnostic cardiac cath and 1-2% or less with angioplasty/stenting.   Coronary artery calcification: Continue aspirin and statin with invasive assessment as above.   Risk Assessment/Risk Scores:           Severity of Illness: The appropriate patient status for this patient is  OBSERVATION. Observation status is judged to be reasonable and necessary in order to provide the required intensity of service to ensure the patient's safety. The patient's presenting symptoms, physical exam findings, and initial radiographic and laboratory data in the context of their medical condition is felt to  place them at decreased risk for further clinical deterioration. Furthermore, it is anticipated that the patient will be medically stable for discharge from the hospital within 2 midnights of admission.    For questions or updates, please contact [place] Please consult [email or web address] for contact info under     Signed, Early Osmond, MD  10/28/2022 8:19 AM

## 2022-10-28 NOTE — Discharge Instructions (Signed)

## 2022-10-29 ENCOUNTER — Encounter: Payer: Self-pay | Admitting: Hematology and Oncology

## 2022-11-01 ENCOUNTER — Encounter (HOSPITAL_COMMUNITY): Payer: Self-pay

## 2022-11-01 ENCOUNTER — Telehealth: Payer: Self-pay

## 2022-11-01 ENCOUNTER — Encounter (HOSPITAL_COMMUNITY)
Admission: RE | Admit: 2022-11-01 | Discharge: 2022-11-01 | Disposition: A | Discharge status: Home / Self Care | Payer: BC Managed Care – PPO | Source: Ambulatory Visit | Attending: Internal Medicine | Admitting: Internal Medicine

## 2022-11-01 ENCOUNTER — Other Ambulatory Visit: Payer: Self-pay | Admitting: Gynecologic Oncology

## 2022-11-01 ENCOUNTER — Other Ambulatory Visit: Payer: Self-pay

## 2022-11-01 ENCOUNTER — Encounter: Payer: Self-pay | Admitting: Hematology and Oncology

## 2022-11-01 DIAGNOSIS — E785 Hyperlipidemia, unspecified: Secondary | ICD-10-CM | POA: Insufficient documentation

## 2022-11-01 DIAGNOSIS — R300 Dysuria: Secondary | ICD-10-CM

## 2022-11-01 DIAGNOSIS — Z01812 Encounter for preprocedural laboratory examination: Secondary | ICD-10-CM | POA: Diagnosis not present

## 2022-11-01 DIAGNOSIS — C482 Malignant neoplasm of peritoneum, unspecified: Secondary | ICD-10-CM | POA: Diagnosis not present

## 2022-11-01 HISTORY — DX: Anemia, unspecified: D64.9

## 2022-11-01 HISTORY — DX: Gastro-esophageal reflux disease without esophagitis: K21.9

## 2022-11-01 HISTORY — DX: Hypothyroidism, unspecified: E03.9

## 2022-11-01 HISTORY — DX: Pneumonia, unspecified organism: J18.9

## 2022-11-01 LAB — CBC WITH DIFFERENTIAL/PLATELET
Abs Immature Granulocytes: 0.03 10*3/uL (ref 0.00–0.07)
Basophils Absolute: 0 10*3/uL (ref 0.0–0.1)
Basophils Relative: 1 %
Eosinophils Absolute: 0 10*3/uL (ref 0.0–0.5)
Eosinophils Relative: 1 %
HCT: 27.3 % — ABNORMAL LOW (ref 36.0–46.0)
Hemoglobin: 8.8 g/dL — ABNORMAL LOW (ref 12.0–15.0)
Immature Granulocytes: 0 %
Lymphocytes Relative: 38 %
Lymphs Abs: 3 10*3/uL (ref 0.7–4.0)
MCH: 30 pg (ref 26.0–34.0)
MCHC: 32.2 g/dL (ref 30.0–36.0)
MCV: 93.2 fL (ref 80.0–100.0)
Monocytes Absolute: 0.5 10*3/uL (ref 0.1–1.0)
Monocytes Relative: 6 %
Neutro Abs: 4.3 10*3/uL (ref 1.7–7.7)
Neutrophils Relative %: 54 %
Platelets: 131 10*3/uL — ABNORMAL LOW (ref 150–400)
RBC: 2.93 MIL/uL — ABNORMAL LOW (ref 3.87–5.11)
RDW: 26.6 % — ABNORMAL HIGH (ref 11.5–15.5)
WBC: 7.8 10*3/uL (ref 4.0–10.5)
nRBC: 0 % (ref 0.0–0.2)

## 2022-11-01 LAB — COMPREHENSIVE METABOLIC PANEL
ALT: 17 U/L (ref 0–44)
AST: 19 U/L (ref 15–41)
Albumin: 3.3 g/dL — ABNORMAL LOW (ref 3.5–5.0)
Alkaline Phosphatase: 69 U/L (ref 38–126)
Anion gap: 11 (ref 5–15)
BUN: 28 mg/dL — ABNORMAL HIGH (ref 6–20)
CO2: 25 mmol/L (ref 22–32)
Calcium: 9.3 mg/dL (ref 8.9–10.3)
Chloride: 99 mmol/L (ref 98–111)
Creatinine, Ser: 1.06 mg/dL — ABNORMAL HIGH (ref 0.44–1.00)
GFR, Estimated: >60 mL/min (ref >60)
Glucose, Bld: 107 mg/dL — ABNORMAL HIGH (ref 70–99)
Potassium: 4.2 mmol/L (ref 3.5–5.1)
Sodium: 135 mmol/L (ref 135–145)
Total Bilirubin: 0.3 mg/dL (ref 0.3–1.2)
Total Protein: 8.3 g/dL — ABNORMAL HIGH (ref 6.5–8.1)

## 2022-11-01 NOTE — Progress Notes (Addendum)
COVID Vaccine Completed:  Yes  Date of COVID positive in last 90 days:  No  PCP - Cristie Hem, MD Cardiologist - Lenna Sciara, MD  Chest x-ray - CT chest 10-06-22 EKG - 10-28-22 Epic Stress Test - Yes ECHO - 08-12-22 Epic Cardiac Cath - 10-28-22 Epic Pacemaker/ICD device last checked: Spinal Cord Stimulator: N/A  Bowel Prep - Yes.  Patient has instructions  Sleep Study - N/A CPAP -   Fasting Blood Sugar - N/A Checks Blood Sugar _____ times a day  Last dose of GLP1 agonist-  N/A GLP1 instructions:  N/A   Last dose of SGLT-2 inhibitors-  N/A SGLT-2 instructions: N/A  Blood Thinner Instructions: Aspirin Instructions:  ASA 81.  Patient has instructions on when to stop at home. Last Dose:  Activity level:  Can go up a flight of stairs and perform activities of daily living without stopping and without symptoms of chest pain or shortness of breath.  Anesthesia review:  Cardiomyopathy.  Last chemo treatment 10/11/22.  Patient states that she has some shortness of breath after chemo.    Hemoglobin 8.8 on PAT labs  BP low at PAT, discussed with [facility], PA-C.  Patient will push fluids and monitor at home.  Patient denies shortness of breath, fever, cough and chest pain at PAT appointment  Patient verbalized understanding of instructions that were given to them at the PAT appointment. Patient was also instructed that they will need to review over the PAT instructions again at home before surgery.

## 2022-11-01 NOTE — Patient Instructions (Addendum)
SURGICAL WAITING ROOM VISITATION Patients having surgery or a procedure may have no more than 2 support people in the waiting area - these visitors may rotate.    If the patient needs to stay at the hospital during part of their recovery, the visitor guidelines for inpatient rooms apply. Pre-op nurse will coordinate an appropriate time for 1 support person to accompany patient in pre-op.  This support person may not rotate.    Please refer to the [facility] website for the visitor guidelines for Inpatients (after your surgery is over and you are in a regular room).   Due to an increase in RSV and influenza rates and associated hospitalizations, children ages 47 and under may not visit patients in [place].     Your procedure is scheduled on: 11-11-22   Report to [facility] Main Entrance    Report to admitting at 10:15 AM   Call this number if you have problems the morning of surgery [phone]   Follow a clear liquid diet the day before surgery    After Midnight you may have the following liquids until 9:30 AM DAY OF SURGERY  Water Non-Citrus Juices (without pulp, NO RED) Carbonated Beverages Black Coffee (NO MILK/CREAM OR CREAMERS, sugar ok)  Clear Tea (NO MILK/CREAM OR CREAMERS, sugar ok) regular and decaf                             Plain Jell-O (NO RED)                                           Fruit ices (not with fruit pulp, NO RED)                                     Popsicles (NO RED)                                                               Sports drinks like Gatorade (NO RED)  Drink 2 Pre-Surgery Ensure the evening before surgery (have completed by 10 PM)                   The day of surgery:  Drink ONE (1) Pre-Surgery Clear Ensure at 9:30 AM the morning of surgery. Drink in one sitting. Do not sip.  This drink was given to you during your hospital  pre-op appointment visit. Nothing else to drink after completing the Pre-Surgery Clear  Ensure .          If you have questions, please contact your surgeon's office.   FOLLOW BOWEL PREP AND ANY ADDITIONAL PRE OP INSTRUCTIONS YOU RECEIVED FROM YOUR SURGEON'S OFFICE!!!      Oral Hygiene is also important to reduce your risk of infection.                                    Remember - BRUSH YOUR TEETH THE MORNING OF SURGERY WITH YOUR REGULAR TOOTHPASTE  Do NOT smoke after Midnight   Take these medicines the morning of surgery with A SIP OF WATER:   Levothyroxin  Pantoprazole                              You may not have any metal on your body including hair pins, jewelry, and body piercing             Do not wear make-up, lotions, powders, perfumes or deodorant  Do not wear nail polish including gel and S&S, artificial/acrylic nails, or any other type of covering on natural nails including finger and toenails. If you have artificial nails, gel coating, etc. that needs to be removed by a nail salon please have this removed prior to surgery or surgery may need to be canceled/ delayed if the surgeon/ anesthesia feels like they are unable to be safely monitored.   Do not shave  48 hours prior to surgery.    Do not bring valuables to the hospital. [place].   Contacts, dentures or bridgework may not be worn into surgery.   Bring small overnight bag day of surgery.   DO NOT [place]. PHARMACY WILL DISPENSE MEDICATIONS LISTED ON YOUR MEDICATION LIST TO YOU DURING YOUR ADMISSION [place]!    Patients discharged on the day of surgery will not be allowed to drive home.  Someone NEEDS to stay with you for the first 24 hours after anesthesia.    Please read over the following fact sheets you were given: IF [place]  If you received a COVID test during your pre-op visit  it is requested that you wear a mask when out in public, stay  away from anyone that may not be feeling well and notify your surgeon if you develop symptoms. If you test positive for Covid or have been in contact with anyone that has tested positive in the last 10 days please notify you surgeon.  [facility] - Preparing for Surgery Before surgery, you can play an important role.  Because skin is not sterile, your skin needs to be as free of germs as possible.  You can reduce the number of germs on your skin by washing with CHG (chlorahexidine gluconate) soap before surgery.  CHG is an antiseptic cleaner which kills germs and bonds with the skin to continue killing germs even after washing. Please DO NOT use if you have an allergy to CHG or antibacterial soaps.  If your skin becomes reddened/irritated stop using the CHG and inform your nurse when you arrive at Short Stay. Do not shave (including legs and underarms) for at least 48 hours prior to the first CHG shower.  You may shave your face/neck.  Please follow these instructions carefully:  1.  Shower with CHG Soap the night before surgery and the  morning of surgery.  2.  If you choose to wash your hair, wash your hair first as usual with your normal  shampoo.  3.  After you shampoo, rinse your hair and body thoroughly to remove the shampoo.                             4.  Use CHG as you would any other liquid soap.  You can apply chg directly  to the skin and wash.  Gently with a scrungie or clean washcloth.  5.  Apply the CHG Soap to your body ONLY FROM THE NECK DOWN.   Do   not use on face/ open                           Wound or open sores. Avoid contact with eyes, ears mouth and   genitals (private parts).                       Wash face,  Genitals (private parts) with your normal soap.             6.  Wash thoroughly, paying special attention to the area where your    surgery  will be performed.  7.  Thoroughly rinse your body with warm water from the neck down.  8.  DO NOT shower/wash with your normal  soap after using and rinsing off the CHG Soap.                9.  Pat yourself dry with a clean towel.            10.  Wear clean pajamas.            11.  Place clean sheets on your bed the night of your first shower and do not  sleep with pets. Day of Surgery : Do not apply any lotions/deodorants the morning of surgery.  Please wear clean clothes to the hospital/surgery center.  FAILURE TO FOLLOW THESE INSTRUCTIONS MAY RESULT IN THE CANCELLATION OF YOUR SURGERY  PATIENT SIGNATURE_________________________________  NURSE SIGNATURE__________________________________  ________________________________________________________________________   WHAT IS A BLOOD TRANSFUSION? Blood Transfusion Information  A transfusion is the replacement of blood or some of its parts. Blood is made up of multiple cells which provide different functions. Red blood cells carry oxygen and are used for blood loss replacement. White blood cells fight against infection. Platelets control bleeding. Plasma helps clot blood. Other blood products are available for specialized needs, such as hemophilia or other clotting disorders. BEFORE THE TRANSFUSION  Who gives blood for transfusions?  Healthy volunteers who are fully evaluated to make sure their blood is safe. This is blood bank blood. Transfusion therapy is the safest it has ever been in the practice of medicine. Before blood is taken from a donor, a complete history is taken to make sure that person has no history of diseases nor engages in risky social behavior (examples are intravenous drug use or sexual activity with multiple partners). The donor's travel history is screened to minimize risk of transmitting infections, such as malaria. The donated blood is tested for signs of infectious diseases, such as HIV and hepatitis. The blood is then tested to be sure it is compatible with you in order to minimize the chance of a transfusion reaction. If you or a relative  donates blood, this is often done in anticipation of surgery and is not appropriate for emergency situations. It takes many days to process the donated blood. RISKS AND COMPLICATIONS Although transfusion therapy is very safe and saves many lives, the main dangers of transfusion include:  Getting an infectious disease. Developing a transfusion reaction. This is an allergic reaction to something in the blood you were given. Every precaution is taken to prevent this. The decision to have a blood transfusion has been considered carefully by your caregiver before blood is given. Blood  is not given unless the benefits outweigh the risks. AFTER THE TRANSFUSION Right after receiving a blood transfusion, you will usually feel much better and more energetic. This is especially true if your red blood cells have gotten low (anemic). The transfusion raises the level of the red blood cells which carry oxygen, and this usually causes an energy increase. The nurse administering the transfusion will monitor you carefully for complications. HOME CARE INSTRUCTIONS  No special instructions are needed after a transfusion. You may find your energy is better. Speak with your caregiver about any limitations on activity for underlying diseases you may have. SEEK MEDICAL CARE IF:  Your condition is not improving after your transfusion. You develop redness or irritation at the intravenous (IV) site. SEEK IMMEDIATE MEDICAL CARE IF:  Any of the following symptoms occur over the next 12 hours: Shaking chills. You have a temperature by mouth above 102 F (38.9 C), not controlled by medicine. Chest, back, or muscle pain. People around you feel you are not acting correctly or are confused. Shortness of breath or difficulty breathing. Dizziness and fainting. You get a rash or develop hives. You have a decrease in urine output. Your urine turns a dark color or changes to pink, red, or brown. Any of the following symptoms  occur over the next 10 days: You have a temperature by mouth above 102 F (38.9 C), not controlled by medicine. Shortness of breath. Weakness after normal activity. The white part of the eye turns yellow (jaundice). You have a decrease in the amount of urine or are urinating less often. Your urine turns a dark color or changes to pink, red, or brown. Document Released: 10/08/2000 Document Revised: 01/03/2012 Document Reviewed: 05/27/2008 [facility] Patient Information 2014 [place], [place].  _______________________________________________________________________

## 2022-11-01 NOTE — Telephone Encounter (Signed)
Pt called stating she has had urine frequency x 1 week. She states no pain/pressure/burning while urinating but when the stream finishes there is pain. No bleeding, no fever/chills, no odor. Urine is clear.  Joylene John NP notified

## 2022-11-01 NOTE — Progress Notes (Signed)
Spoke to [place] at Dr. Charisse March office regarding patient's discharge status post surgery.  Patient states that she will be staying a couple notes and posting sheet states ambulatory.

## 2022-11-01 NOTE — Telephone Encounter (Signed)
Message received from [place], [place] nurse, stating pt left a message regarding having UTI S&S (frequency/burning)   I LVM for patient to call back to give more information.

## 2022-11-01 NOTE — Progress Notes (Signed)
See note from [place] about burning at end of urination. Recommend urine sample be assessed prior to foley insertion during surgery.

## 2022-11-02 ENCOUNTER — Encounter: Payer: Self-pay | Admitting: Hematology and Oncology

## 2022-11-02 NOTE — Telephone Encounter (Signed)
Told Lauren Chandler that Lauren John, NP stated that she needed to come in for a urine specimen to see if she has a UTI prior to prescribing ATB. Pt verbalized understanding.  Pt will come in at 0815 11-03-22.

## 2022-11-03 ENCOUNTER — Other Ambulatory Visit: Payer: Self-pay

## 2022-11-03 ENCOUNTER — Inpatient Hospital Stay: Payer: BC Managed Care – PPO | Attending: Gynecologic Oncology

## 2022-11-03 DIAGNOSIS — D6481 Anemia due to antineoplastic chemotherapy: Secondary | ICD-10-CM | POA: Insufficient documentation

## 2022-11-03 DIAGNOSIS — Z79899 Other long term (current) drug therapy: Secondary | ICD-10-CM | POA: Insufficient documentation

## 2022-11-03 DIAGNOSIS — C482 Malignant neoplasm of peritoneum, unspecified: Secondary | ICD-10-CM | POA: Insufficient documentation

## 2022-11-03 DIAGNOSIS — R300 Dysuria: Secondary | ICD-10-CM

## 2022-11-03 DIAGNOSIS — T451X5A Adverse effect of antineoplastic and immunosuppressive drugs, initial encounter: Secondary | ICD-10-CM | POA: Diagnosis not present

## 2022-11-03 LAB — HEPATIC FUNCTION PANEL
ALT: 15 IU/L (ref 0–32)
AST: 16 IU/L (ref 0–40)
Albumin: 3.8 g/dL (ref 3.8–4.9)
Alkaline Phosphatase: 93 IU/L (ref 44–121)
Bilirubin Total: <0.2 mg/dL (ref 0.0–1.2)
Bilirubin, Direct: <0.1 mg/dL (ref 0.00–0.40)
Total Protein: 7.9 g/dL (ref 6.0–8.5)

## 2022-11-03 LAB — APOLIPOPROTEIN B: Apolipoprotein B: 158 mg/dL — ABNORMAL HIGH (ref <90)

## 2022-11-03 LAB — LIPID PANEL
Chol/HDL Ratio: 6.2 ratio — ABNORMAL HIGH (ref 0.0–4.4)
Cholesterol, Total: 278 mg/dL — ABNORMAL HIGH (ref 100–199)
HDL: 45 mg/dL (ref >39)
LDL Chol Calc (NIH): 195 mg/dL — ABNORMAL HIGH (ref 0–99)
Triglycerides: 201 mg/dL — ABNORMAL HIGH (ref 0–149)
VLDL Cholesterol Cal: 38 mg/dL (ref 5–40)

## 2022-11-04 ENCOUNTER — Telehealth: Payer: Self-pay | Admitting: Surgery

## 2022-11-04 ENCOUNTER — Other Ambulatory Visit: Payer: Self-pay | Admitting: Gynecologic Oncology

## 2022-11-04 DIAGNOSIS — N390 Urinary tract infection, site not specified: Secondary | ICD-10-CM

## 2022-11-04 MED ORDER — AMOXICILLIN 500 MG PO CAPS: 500.0000 mg | ORAL_CAPSULE | Freq: Two times a day (BID) | ORAL | 0 refills | Status: DC

## 2022-11-04 NOTE — Progress Notes (Signed)
See RN note. Plan to treat klebsiella pneum in urine sample obtained preop. Amoxicillin ordered per Dr. Alvy Bimler.

## 2022-11-04 NOTE — Telephone Encounter (Signed)
Reviewed Urine culture results with patient and advised patient that due to her being symptomatic, having surgery upcoming, and recent chemo, Dr Alvy Bimler recommends starting Amoxicillin. Urine culture results still pending the susceptibilities; patient made aware that once they result antibiotic may need to change. Patient verbalized understanding and updated pharmacy while on the phone with patient. No other concerns at this time.

## 2022-11-05 ENCOUNTER — Other Ambulatory Visit: Payer: Self-pay | Admitting: Gynecologic Oncology

## 2022-11-05 ENCOUNTER — Telehealth: Payer: Self-pay | Admitting: Surgery

## 2022-11-05 DIAGNOSIS — N390 Urinary tract infection, site not specified: Secondary | ICD-10-CM

## 2022-11-05 LAB — URINE CULTURE: Culture: 80000 — AB

## 2022-11-05 MED ORDER — CEFDINIR 300 MG PO CAPS: 300.0000 mg | ORAL_CAPSULE | Freq: Two times a day (BID) | ORAL | 0 refills | Status: DC

## 2022-11-05 NOTE — Telephone Encounter (Signed)
Called patient to let her know that the rest of her culture results are back and they are a bacteria that is resistant to penicillins. Because of this we need to change her antibiotic from Amoxicillin to Cefdinir. New prescription sent to her preferred pharmacy. Patient instructed to stop the amoxicillin and start cefdinir today. Patient verbalized understanding and had no further questions at this time.

## 2022-11-08 ENCOUNTER — Encounter: Payer: Self-pay | Admitting: Hematology and Oncology

## 2022-11-10 ENCOUNTER — Encounter: Payer: Self-pay | Admitting: Hematology and Oncology

## 2022-11-10 ENCOUNTER — Other Ambulatory Visit (HOSPITAL_COMMUNITY): Payer: Self-pay

## 2022-11-10 ENCOUNTER — Telehealth: Payer: Self-pay | Admitting: *Deleted

## 2022-11-10 NOTE — Telephone Encounter (Signed)
Telephone call to check on pre-operative status.  Patient compliant with pre-operative instructions. Reports that pharmacy unable to dispense E-mycin (availability issue). Call to [facility] and they will dispense and notify patient when ready. Will be ready by 2pm start time. Reinforced nothing to eat after midnight. Reports she has instructions for Ensure from hospital. Clear liquids until 0930. Patient to arrive at 1015.  No questions or concerns voiced.  Instructed to call for any needs.

## 2022-11-11 ENCOUNTER — Inpatient Hospital Stay (HOSPITAL_COMMUNITY)
Admission: RE | Admit: 2022-11-11 | Discharge: 2022-11-15 | DRG: 336 | Disposition: A | Discharge status: Home / Self Care | Payer: BC Managed Care – PPO | Source: Ambulatory Visit | Attending: Gynecologic Oncology | Admitting: Gynecologic Oncology

## 2022-11-11 ENCOUNTER — Encounter (HOSPITAL_COMMUNITY): Payer: Self-pay | Admitting: Gynecologic Oncology

## 2022-11-11 ENCOUNTER — Inpatient Hospital Stay (HOSPITAL_COMMUNITY): Payer: BC Managed Care – PPO | Admitting: Physician Assistant

## 2022-11-11 ENCOUNTER — Encounter (HOSPITAL_COMMUNITY)
Admission: RE | Disposition: A | Discharge status: Home / Self Care | Payer: Self-pay | Source: Ambulatory Visit | Attending: Gynecologic Oncology

## 2022-11-11 ENCOUNTER — Inpatient Hospital Stay (HOSPITAL_COMMUNITY): Payer: BC Managed Care – PPO | Admitting: Certified Registered Nurse Anesthetist

## 2022-11-11 ENCOUNTER — Other Ambulatory Visit: Payer: Self-pay

## 2022-11-11 DIAGNOSIS — C785 Secondary malignant neoplasm of large intestine and rectum: Secondary | ICD-10-CM | POA: Diagnosis not present

## 2022-11-11 DIAGNOSIS — E039 Hypothyroidism, unspecified: Secondary | ICD-10-CM | POA: Diagnosis present

## 2022-11-11 DIAGNOSIS — K219 Gastro-esophageal reflux disease without esophagitis: Secondary | ICD-10-CM | POA: Diagnosis present

## 2022-11-11 DIAGNOSIS — Z9081 Acquired absence of spleen: Secondary | ICD-10-CM

## 2022-11-11 DIAGNOSIS — D630 Anemia in neoplastic disease: Secondary | ICD-10-CM | POA: Diagnosis not present

## 2022-11-11 DIAGNOSIS — Z9013 Acquired absence of bilateral breasts and nipples: Secondary | ICD-10-CM

## 2022-11-11 DIAGNOSIS — Z1501 Genetic susceptibility to malignant neoplasm of breast: Secondary | ICD-10-CM | POA: Diagnosis not present

## 2022-11-11 DIAGNOSIS — K6389 Other specified diseases of intestine: Secondary | ICD-10-CM | POA: Diagnosis not present

## 2022-11-11 DIAGNOSIS — C579 Malignant neoplasm of female genital organ, unspecified: Secondary | ICD-10-CM | POA: Diagnosis present

## 2022-11-11 DIAGNOSIS — C786 Secondary malignant neoplasm of retroperitoneum and peritoneum: Secondary | ICD-10-CM | POA: Diagnosis not present

## 2022-11-11 DIAGNOSIS — L299 Pruritus, unspecified: Secondary | ICD-10-CM

## 2022-11-11 DIAGNOSIS — C482 Malignant neoplasm of peritoneum, unspecified: Secondary | ICD-10-CM

## 2022-11-11 DIAGNOSIS — C7911 Secondary malignant neoplasm of bladder: Secondary | ICD-10-CM | POA: Diagnosis not present

## 2022-11-11 DIAGNOSIS — K660 Peritoneal adhesions (postprocedural) (postinfection): Secondary | ICD-10-CM | POA: Diagnosis present

## 2022-11-11 DIAGNOSIS — D6481 Anemia due to antineoplastic chemotherapy: Secondary | ICD-10-CM | POA: Diagnosis not present

## 2022-11-11 DIAGNOSIS — Z803 Family history of malignant neoplasm of breast: Secondary | ICD-10-CM

## 2022-11-11 DIAGNOSIS — C784 Secondary malignant neoplasm of small intestine: Secondary | ICD-10-CM | POA: Diagnosis not present

## 2022-11-11 DIAGNOSIS — Z800 Family history of malignant neoplasm of digestive organs: Secondary | ICD-10-CM | POA: Diagnosis not present

## 2022-11-11 DIAGNOSIS — I10 Essential (primary) hypertension: Secondary | ICD-10-CM | POA: Diagnosis not present

## 2022-11-11 DIAGNOSIS — Z8571 Personal history of Hodgkin lymphoma: Secondary | ICD-10-CM

## 2022-11-11 DIAGNOSIS — I2510 Atherosclerotic heart disease of native coronary artery without angina pectoris: Secondary | ICD-10-CM | POA: Diagnosis not present

## 2022-11-11 DIAGNOSIS — I429 Cardiomyopathy, unspecified: Secondary | ICD-10-CM

## 2022-11-11 DIAGNOSIS — Z9221 Personal history of antineoplastic chemotherapy: Secondary | ICD-10-CM | POA: Diagnosis not present

## 2022-11-11 DIAGNOSIS — C481 Malignant neoplasm of specified parts of peritoneum: Secondary | ICD-10-CM | POA: Diagnosis not present

## 2022-11-11 DIAGNOSIS — Z853 Personal history of malignant neoplasm of breast: Secondary | ICD-10-CM

## 2022-11-11 DIAGNOSIS — N179 Acute kidney failure, unspecified: Secondary | ICD-10-CM | POA: Diagnosis not present

## 2022-11-11 DIAGNOSIS — T451X5A Adverse effect of antineoplastic and immunosuppressive drugs, initial encounter: Secondary | ICD-10-CM | POA: Diagnosis present

## 2022-11-11 DIAGNOSIS — R112 Nausea with vomiting, unspecified: Secondary | ICD-10-CM

## 2022-11-11 HISTORY — PX: DEBULKING: SHX6277

## 2022-11-11 HISTORY — PX: OMENTECTOMY: SHX5985

## 2022-11-11 LAB — TYPE AND SCREEN
ABO/RH(D): A POS
Antibody Screen: NEGATIVE

## 2022-11-11 LAB — CBC
HCT: 27.3 % — ABNORMAL LOW (ref 36.0–46.0)
Hemoglobin: 8.7 g/dL — ABNORMAL LOW (ref 12.0–15.0)
MCH: 31.3 pg (ref 26.0–34.0)
MCHC: 31.9 g/dL (ref 30.0–36.0)
MCV: 98.2 fL (ref 80.0–100.0)
Platelets: 418 10*3/uL — ABNORMAL HIGH (ref 150–400)
RBC: 2.78 MIL/uL — ABNORMAL LOW (ref 3.87–5.11)
RDW: 27.9 % — ABNORMAL HIGH (ref 11.5–15.5)
WBC: 16.1 10*3/uL — ABNORMAL HIGH (ref 4.0–10.5)
nRBC: 0 % (ref 0.0–0.2)

## 2022-11-11 LAB — ABO/RH: ABO/RH(D): A POS

## 2022-11-11 SURGERY — OMENTECTOMY
Anesthesia: General

## 2022-11-11 MED ORDER — FENTANYL CITRATE PF 50 MCG/ML IJ SOSY
PREFILLED_SYRINGE | INTRAMUSCULAR | Status: AC
  Filled 2022-11-11: qty 1

## 2022-11-11 MED ORDER — KCL IN DEXTROSE-NACL 10-5-0.45 MEQ/L-%-% IV SOLN
INTRAVENOUS | Status: DC
  Filled 2022-11-11 (×4): qty 1000

## 2022-11-11 MED ORDER — ACETAMINOPHEN 500 MG PO TABS
1000.0000 mg | ORAL_TABLET | ORAL | Status: AC
  Administered 2022-11-11: 1000 mg via ORAL
  Filled 2022-11-11: qty 2

## 2022-11-11 MED ORDER — BUPIVACAINE HCL 0.25 % IJ SOLN
INTRAMUSCULAR | Status: DC | PRN
  Administered 2022-11-11: 20 mL

## 2022-11-11 MED ORDER — PHENYLEPHRINE 80 MCG/ML (10ML) SYRINGE FOR IV PUSH (FOR BLOOD PRESSURE SUPPORT)
PREFILLED_SYRINGE | INTRAVENOUS | Status: DC | PRN
  Administered 2022-11-11 (×3): 160 ug via INTRAVENOUS

## 2022-11-11 MED ORDER — DEXAMETHASONE SODIUM PHOSPHATE 10 MG/ML IJ SOLN
INTRAMUSCULAR | Status: AC
  Filled 2022-11-11: qty 1

## 2022-11-11 MED ORDER — HEPARIN SODIUM (PORCINE) 5000 UNIT/ML IJ SOLN
5000.0000 [IU] | INTRAMUSCULAR | Status: AC
  Administered 2022-11-11: 5000 [IU] via SUBCUTANEOUS
  Filled 2022-11-11: qty 1

## 2022-11-11 MED ORDER — ONDANSETRON HCL 4 MG/2ML IJ SOLN
INTRAMUSCULAR | Status: DC | PRN
  Administered 2022-11-11: 4 mg via INTRAVENOUS

## 2022-11-11 MED ORDER — ORAL CARE MOUTH RINSE: 15.0000 mL | Freq: Once | OROMUCOSAL | Status: AC

## 2022-11-11 MED ORDER — MIDAZOLAM HCL 2 MG/2ML IJ SOLN
INTRAMUSCULAR | Status: AC
  Filled 2022-11-11: qty 2

## 2022-11-11 MED ORDER — KETOROLAC TROMETHAMINE 15 MG/ML IJ SOLN: 15.0000 mg | INTRAMUSCULAR | Status: DC

## 2022-11-11 MED ORDER — HYDROMORPHONE HCL 1 MG/ML IJ SOLN
0.2000 mg | INTRAMUSCULAR | Status: DC | PRN
  Administered 2022-11-12 (×2): 0.5 mg via INTRAVENOUS
  Administered 2022-11-12: 0.6 mg via INTRAVENOUS
  Administered 2022-11-12: 0.5 mg via INTRAVENOUS
  Filled 2022-11-11 (×4): qty 1

## 2022-11-11 MED ORDER — FENTANYL CITRATE (PF) 100 MCG/2ML IJ SOLN
INTRAMUSCULAR | Status: AC
  Filled 2022-11-11: qty 2

## 2022-11-11 MED ORDER — MIDAZOLAM HCL 5 MG/5ML IJ SOLN
INTRAMUSCULAR | Status: DC | PRN
  Administered 2022-11-11: 2 mg via INTRAVENOUS

## 2022-11-11 MED ORDER — SODIUM CHLORIDE (PF) 0.9 % IJ SOLN
INTRAMUSCULAR | Status: AC
  Filled 2022-11-11: qty 10

## 2022-11-11 MED ORDER — SCOPOLAMINE 1 MG/3DAYS TD PT72
1.0000 | MEDICATED_PATCH | TRANSDERMAL | Status: DC
  Administered 2022-11-11: 1.5 mg via TRANSDERMAL
  Filled 2022-11-11: qty 1

## 2022-11-11 MED ORDER — PHENYLEPHRINE HCL (PRESSORS) 10 MG/ML IV SOLN
INTRAVENOUS | Status: AC
  Filled 2022-11-11: qty 1

## 2022-11-11 MED ORDER — ROCURONIUM BROMIDE 10 MG/ML (PF) SYRINGE
PREFILLED_SYRINGE | INTRAVENOUS | Status: DC | PRN
  Administered 2022-11-11 (×2): 10 mg via INTRAVENOUS
  Administered 2022-11-11: 60 mg via INTRAVENOUS
  Administered 2022-11-11: 5 mg via INTRAVENOUS

## 2022-11-11 MED ORDER — CEFAZOLIN SODIUM-DEXTROSE 2-4 GM/100ML-% IV SOLN
2.0000 g | INTRAVENOUS | Status: AC
  Administered 2022-11-11: 2 g via INTRAVENOUS
  Filled 2022-11-11: qty 100

## 2022-11-11 MED ORDER — PROPOFOL 10 MG/ML IV BOLUS
INTRAVENOUS | Status: DC | PRN
  Administered 2022-11-11: 100 mg via INTRAVENOUS

## 2022-11-11 MED ORDER — 0.9 % SODIUM CHLORIDE (POUR BTL) OPTIME
TOPICAL | Status: DC | PRN
  Administered 2022-11-11: 8000 mL

## 2022-11-11 MED ORDER — BUPIVACAINE LIPOSOME 1.3 % IJ SUSP
INTRAMUSCULAR | Status: DC | PRN
  Administered 2022-11-11: 20 mL

## 2022-11-11 MED ORDER — HEMOSTATIC AGENTS (NO CHARGE) OPTIME
TOPICAL | Status: DC | PRN
  Administered 2022-11-11: 1 via TOPICAL

## 2022-11-11 MED ORDER — ALBUMIN HUMAN 5 % IV SOLN
INTRAVENOUS | Status: AC
  Filled 2022-11-11: qty 250

## 2022-11-11 MED ORDER — SODIUM CHLORIDE (PF) 0.9 % IJ SOLN
INTRAMUSCULAR | Status: AC
  Filled 2022-11-11: qty 20

## 2022-11-11 MED ORDER — SUGAMMADEX SODIUM 200 MG/2ML IV SOLN
INTRAVENOUS | Status: DC | PRN
  Administered 2022-11-11: 250 mg via INTRAVENOUS

## 2022-11-11 MED ORDER — PANTOPRAZOLE SODIUM 40 MG PO TBEC
40.0000 mg | DELAYED_RELEASE_TABLET | Freq: Every day | ORAL | Status: DC
  Administered 2022-11-12 – 2022-11-15 (×4): 40 mg via ORAL
  Filled 2022-11-11 (×4): qty 1

## 2022-11-11 MED ORDER — ENSURE PRE-SURGERY PO LIQD
592.0000 mL | Freq: Once | ORAL | Status: DC
  Filled 2022-11-11: qty 592

## 2022-11-11 MED ORDER — ALBUMIN HUMAN 5 % IV SOLN: INTRAVENOUS | Status: DC | PRN

## 2022-11-11 MED ORDER — HYDROMORPHONE HCL 1 MG/ML IJ SOLN
INTRAMUSCULAR | Status: DC | PRN
  Administered 2022-11-11 (×2): .4 mg via INTRAVENOUS

## 2022-11-11 MED ORDER — CHLORHEXIDINE GLUCONATE 0.12 % MT SOLN
15.0000 mL | Freq: Once | OROMUCOSAL | Status: AC
  Administered 2022-11-11: 15 mL via OROMUCOSAL

## 2022-11-11 MED ORDER — ONDANSETRON HCL 4 MG PO TABS
4.0000 mg | ORAL_TABLET | Freq: Four times a day (QID) | ORAL | Status: DC | PRN
  Administered 2022-11-14: 4 mg via ORAL
  Filled 2022-11-11: qty 1

## 2022-11-11 MED ORDER — HYDROMORPHONE HCL 2 MG/ML IJ SOLN
INTRAMUSCULAR | Status: AC
  Filled 2022-11-11: qty 1

## 2022-11-11 MED ORDER — PHENYLEPHRINE HCL-NACL 20-0.9 MG/250ML-% IV SOLN
INTRAVENOUS | Status: DC | PRN
  Administered 2022-11-11: 30 ug/min via INTRAVENOUS

## 2022-11-11 MED ORDER — POVIDONE-IODINE 10 % EX SWAB
2.0000 | Freq: Once | CUTANEOUS | Status: AC
  Administered 2022-11-11: 2 via TOPICAL

## 2022-11-11 MED ORDER — LIDOCAINE HCL URETHRAL/MUCOSAL 2 % EX GEL
CUTANEOUS | Status: AC
  Filled 2022-11-11: qty 30

## 2022-11-11 MED ORDER — ENOXAPARIN SODIUM 40 MG/0.4ML IJ SOSY
40.0000 mg | PREFILLED_SYRINGE | INTRAMUSCULAR | Status: DC
  Administered 2022-11-12 – 2022-11-15 (×4): 40 mg via SUBCUTANEOUS
  Filled 2022-11-11 (×4): qty 0.4

## 2022-11-11 MED ORDER — ONDANSETRON HCL 4 MG/2ML IJ SOLN: 4.0000 mg | Freq: Four times a day (QID) | INTRAMUSCULAR | Status: DC | PRN

## 2022-11-11 MED ORDER — METOPROLOL TARTRATE 5 MG/5ML IV SOLN
2.5000 mg | Freq: Two times a day (BID) | INTRAVENOUS | Status: DC
  Administered 2022-11-11 – 2022-11-12 (×2): 2.5 mg via INTRAVENOUS
  Filled 2022-11-11 (×2): qty 5

## 2022-11-11 MED ORDER — LACTATED RINGERS IV SOLN: INTRAVENOUS | Status: DC | PRN

## 2022-11-11 MED ORDER — LIDOCAINE 2% (20 MG/ML) 5 ML SYRINGE
INTRAMUSCULAR | Status: DC | PRN
  Administered 2022-11-11: 60 mg via INTRAVENOUS

## 2022-11-11 MED ORDER — DEXAMETHASONE SODIUM PHOSPHATE 10 MG/ML IJ SOLN
INTRAMUSCULAR | Status: DC | PRN
  Administered 2022-11-11: 5 mg via INTRAVENOUS

## 2022-11-11 MED ORDER — LIDOCAINE HCL (PF) 2 % IJ SOLN
INTRAMUSCULAR | Status: AC
  Filled 2022-11-11: qty 5

## 2022-11-11 MED ORDER — ONDANSETRON HCL 4 MG/2ML IJ SOLN
INTRAMUSCULAR | Status: AC
  Filled 2022-11-11: qty 2

## 2022-11-11 MED ORDER — ENSURE PRE-SURGERY PO LIQD: 296.0000 mL | Freq: Once | ORAL | Status: DC

## 2022-11-11 MED ORDER — BUPIVACAINE HCL 0.25 % IJ SOLN
INTRAMUSCULAR | Status: AC
  Filled 2022-11-11: qty 1

## 2022-11-11 MED ORDER — FENTANYL CITRATE PF 50 MCG/ML IJ SOSY
25.0000 ug | PREFILLED_SYRINGE | INTRAMUSCULAR | Status: DC | PRN
  Administered 2022-11-11: 25 ug via INTRAVENOUS

## 2022-11-11 MED ORDER — PROPOFOL 10 MG/ML IV BOLUS
INTRAVENOUS | Status: AC
  Filled 2022-11-11: qty 20

## 2022-11-11 MED ORDER — DEXAMETHASONE SODIUM PHOSPHATE 4 MG/ML IJ SOLN: 4.0000 mg | INTRAMUSCULAR | Status: DC

## 2022-11-11 MED ORDER — LACTATED RINGERS IV SOLN: INTRAVENOUS | Status: DC

## 2022-11-11 MED ORDER — FENTANYL CITRATE (PF) 100 MCG/2ML IJ SOLN
INTRAMUSCULAR | Status: DC | PRN
  Administered 2022-11-11 (×4): 50 ug via INTRAVENOUS

## 2022-11-11 MED ORDER — SODIUM CHLORIDE (PF) 0.9 % IJ SOLN
INTRAMUSCULAR | Status: DC | PRN
  Administered 2022-11-11: 20 mL

## 2022-11-11 MED ORDER — ONDANSETRON HCL 4 MG/2ML IJ SOLN: 4.0000 mg | Freq: Once | INTRAMUSCULAR | Status: DC | PRN

## 2022-11-11 MED ORDER — BUPIVACAINE LIPOSOME 1.3 % IJ SUSP
INTRAMUSCULAR | Status: AC
  Filled 2022-11-11: qty 20

## 2022-11-11 MED ORDER — LEVOTHYROXINE SODIUM 125 MCG PO TABS
125.0000 ug | ORAL_TABLET | Freq: Every day | ORAL | Status: DC
  Administered 2022-11-13 – 2022-11-15 (×3): 125 ug via ORAL
  Filled 2022-11-11 (×3): qty 1

## 2022-11-11 SURGICAL SUPPLY — 71 items
ADH SKN CLS APL DERMABOND .7 (GAUZE/BANDAGES/DRESSINGS) ×1
AGENT HMST KT MTR STRL THRMB (HEMOSTASIS) ×1
APL PRP STRL LF DISP 70% ISPRP (MISCELLANEOUS) ×1
BAG COUNTER SPONGE SURGICOUNT (BAG) IMPLANT
BAG SPNG CNTER NS LX DISP (BAG)
BLADE EXTENDED COATED 6.5IN (ELECTRODE) ×2 IMPLANT
CELLS DAT CNTRL 66122 CELL SVR (MISCELLANEOUS) IMPLANT
CHLORAPREP W/TINT 26 (MISCELLANEOUS) ×2 IMPLANT
CLIP TI LARGE 6 (CLIP) ×2 IMPLANT
CLIP TI MEDIUM 6 (CLIP) ×2 IMPLANT
CLIP TI MEDIUM LARGE 6 (CLIP) ×2 IMPLANT
CNTNR URN SCR LID CUP LEK RST (MISCELLANEOUS) IMPLANT
CONT SPEC 4OZ STRL OR WHT (MISCELLANEOUS)
DERMABOND ADVANCED .7 DNX12 (GAUZE/BANDAGES/DRESSINGS) IMPLANT
DRAPE INCISE IOBAN 66X45 STRL (DRAPES) IMPLANT
DRAPE SURG IRRIG POUCH 19X23 (DRAPES) ×2 IMPLANT
DRAPE WARM FLUID 44X44 (DRAPES) ×2 IMPLANT
DRSG OPSITE POSTOP 4X10 (GAUZE/BANDAGES/DRESSINGS) IMPLANT
DRSG OPSITE POSTOP 4X6 (GAUZE/BANDAGES/DRESSINGS) IMPLANT
DRSG OPSITE POSTOP 4X8 (GAUZE/BANDAGES/DRESSINGS) IMPLANT
DRSG TELFA 3X8 NADH STRL (GAUZE/BANDAGES/DRESSINGS) IMPLANT
ELECT REM PT RETURN 15FT ADLT (MISCELLANEOUS) ×2 IMPLANT
GAUZE 4X4 16PLY ~~LOC~~+RFID DBL (SPONGE) IMPLANT
GLOVE BIO SURGEON STRL SZ 6 (GLOVE) ×4 IMPLANT
GLOVE BIO SURGEON STRL SZ 6.5 (GLOVE) ×2 IMPLANT
GOWN STRL REUS W/ TWL LRG LVL3 (GOWN DISPOSABLE) ×4 IMPLANT
GOWN STRL REUS W/TWL LRG LVL3 (GOWN DISPOSABLE) ×2
HEMOSTAT ARISTA ABSORB 3G PWDR (HEMOSTASIS) IMPLANT
KIT BASIN OR (CUSTOM PROCEDURE TRAY) ×2 IMPLANT
KIT TURNOVER KIT A (KITS) IMPLANT
LIGASURE IMPACT 36 18CM CVD LR (INSTRUMENTS) IMPLANT
LOOP VESSEL MAXI BLUE (MISCELLANEOUS) IMPLANT
NDL HYPO 21X1.5 SAFETY (NEEDLE) ×4 IMPLANT
NEEDLE HYPO 21X1.5 SAFETY (NEEDLE) ×2 IMPLANT
NS IRRIG 1000ML POUR BTL (IV SOLUTION) ×4 IMPLANT
PACK GENERAL/GYN (CUSTOM PROCEDURE TRAY) ×2 IMPLANT
RELOAD PROXIMATE 75MM BLUE (ENDOMECHANICALS) IMPLANT
RELOAD PROXIMATE TA60MM BLUE (ENDOMECHANICALS) IMPLANT
RELOAD STAPLE 60 BLU REG PROX (ENDOMECHANICALS) IMPLANT
RELOAD STAPLE 75 3.8 BLU REG (ENDOMECHANICALS) IMPLANT
RETRACTOR WND ALEXIS 18 MED (MISCELLANEOUS) IMPLANT
RETRACTOR WND ALEXIS 25 LRG (MISCELLANEOUS) IMPLANT
RTRCTR WOUND ALEXIS 18CM MED (MISCELLANEOUS)
RTRCTR WOUND ALEXIS 25CM LRG (MISCELLANEOUS) ×1
SHEET LAVH (DRAPES) ×2 IMPLANT
SLEEVE SUCTION CATH 165 (SLEEVE) ×2 IMPLANT
SOL PREP POV-IOD 4OZ 10% (MISCELLANEOUS) ×2 IMPLANT
SPONGE T-LAP 18X18 ~~LOC~~+RFID (SPONGE) IMPLANT
STAPLER GUN LINEAR PROX 60 (STAPLE) IMPLANT
STAPLER PROXIMATE 75MM BLUE (STAPLE) IMPLANT
STAPLER VISISTAT 35W (STAPLE) IMPLANT
SURGIFLO W/THROMBIN 8M KIT (HEMOSTASIS) IMPLANT
SUT MNCRL AB 4-0 PS2 18 (SUTURE) ×4 IMPLANT
SUT PDS AB 1 TP1 96 (SUTURE) ×4 IMPLANT
SUT SILK 3 0 SH CR/8 (SUTURE) IMPLANT
SUT VIC AB 0 CT1 36 (SUTURE) ×8 IMPLANT
SUT VIC AB 2-0 CT1 27 (SUTURE)
SUT VIC AB 2-0 CT1 36 (SUTURE) ×4 IMPLANT
SUT VIC AB 2-0 CT1 TAPERPNT 27 (SUTURE) ×4 IMPLANT
SUT VIC AB 2-0 CT2 27 (SUTURE) ×12 IMPLANT
SUT VIC AB 2-0 SH 27 (SUTURE) ×6
SUT VIC AB 2-0 SH 27X BRD (SUTURE) IMPLANT
SUT VIC AB 3-0 CTX 36 (SUTURE) IMPLANT
SUT VIC AB 3-0 SH 18 (SUTURE) IMPLANT
SUT VIC AB 3-0 SH 27 (SUTURE) ×1
SUT VIC AB 3-0 SH 27X BRD (SUTURE) ×2 IMPLANT
SYR 30ML LL (SYRINGE) ×4 IMPLANT
TOWEL OR 17X26 10 PK STRL BLUE (TOWEL DISPOSABLE) ×2 IMPLANT
TOWEL OR NON WOVEN STRL DISP B (DISPOSABLE) ×2 IMPLANT
TRAY FOLEY MTR SLVR 16FR STAT (SET/KITS/TRAYS/PACK) ×2 IMPLANT
UNDERPAD 30X36 HEAVY ABSORB (UNDERPADS AND DIAPERS) ×2 IMPLANT

## 2022-11-11 NOTE — Op Note (Signed)
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  Postoperative Diagnosis: Same as above  Surgery: Exploratory laparotomy, lysis of adhesions for approximately 45 minutes, total omentectomy with radical tumor debulking including resection of perigastric nodule, excision and fulguration of multiple small bowel mesenteric nodules, resection of multiple sigmoid epiploica, fulguration of peritoneal lesions along bilateral pelvic sidewalls, peritoneal stripping of bladder peritoneum  Surgeon: Jeral Pinch, MD  Assistant: Joylene John, NP  Anesthesia: General   Estimated blood loss: 300 ml  IVF: see I&O flowsheet  Urine output: 300 cc  Complications: None apparent  Pathology: Omentum, small bowel mesenteric nodules, peri-gastric nodule, sigmoid epiploica nodules, bladder peritoneum  Operative findings: On bimanual exam, no masses appreciated at the vaginal cuff, no nodularity.  On intra-abdominal entry, infracolic omentum retracted and involved by residual tumor implant measuring approximately 10 x 4 cm.  Nodularity palpated almost up to the splenic flexure.  Supracolic omentum involved to within 2 cm of the greater curvature of the stomach.  Additional 2 x 3 cm implant noted lateral and inferior to the gastric pylorus (sitting anterior to the duodenum and just inferior to the gallbladder).  Ascending and transverse colon normal in appearance.  Small bowel run from the ileocecal valve to the ligament of Treitz.  Rare small nodules within the mesentery noted.  These were all either excised or fulgurated.  Right aspect of the liver and diaphragm smooth to palpation, no visible lesions.  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  No ascites.  Within the pelvis, minimal nodularity seen along bilateral pelvic sidewalls near the infundibulopelvic remnants,  suspected to be related to prior hysterectomy and BSO.  Several sigmoid epiploica with tumor implants versus treated tumor, all excised fulgurated.  No nodularity within the deep pelvis although some evidence of what appeared to be treated tumor versus tumor rind involving the bladder peritoneum was stripped in this location.  Biopsies taken from the posterior cul-de-sac. R0 resection at the end of surgery.  Procedure: The patient was identified in the preoperative holding area. Informed consent was signed on the chart. Patient was seen history was reviewed and exam was performed.   The patient was then taken to the operating room and placed in the supine position with SCD hose on. General anesthesia was then induced without difficulty. She was then placed in the dorsolithotomy position. The abdomen was prepped with chlor prep sponges per protocol. Perineum was prepped with Betadine. The vagina was prepped with Betadine a Foley catheter was inserted into the bladder under sterile conditions.  The patient was then draped after the prep was dried. Timeout was performed the patient, procedure, antibiotic, allergy, and length of procedure. A vertical midline infraumbilical incision was and carried down to the underlying fascia using Bovie cautery. The fascia was scored in the fascial incision was extended superiorly and inferiorly using Bovie cautery. The rectus bellies were dissected off the overlying fascia. The peritoneum was tented and entered. The peritoneal incision was extended superiorly and inferiorly with visualization of the underlying peritoneal cavity.   Patient was first turned to the mid and upper abdomen.  The small bowel was run from the ileocecal junction to the ligament of Treitz.  Multiple very small nodules were noted intermittently along the small bowel mesentery.  These were either excised or fulgurated.  Attention was then turned to the omentum.  Adhesions of the omentum to  the  anterior abdominal wall superior to the incision were taken down sharply and with monopolar electrocautery.  Once mobilized sufficiently, the Bookwalter self-retaining retractor was then placed. At the initial placement as well as at several points during the case the lateral blades were checked to ensure no significant pressure on the psoas bellies.  The transverse colon was examined along its entirety.  Transverse colon itself was noted to be normal and the splenic flexure also normal in appearance and on palpation.  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  A total omentectomy was performed after the omentum was dissected from the transverse colon using a combination of monopolar and bipolar electrocautery.  Ultimately, the lesser sac was able to be developed and disease was noted to within several centimeters of the greater curvature of the stomach.  Some of the short gastric vessels had to be taken during removal of the tumor which was performed with bipolar electrocautery.  The dissection was then continued towards the left and the omentum was further mobilized from the underlying mesentery and transverse colon.  The descending colon was mobilized along the white line of Toldt near the splenic flexure.  The omentum was cauterized and transected superior to any palpable tumor at the splenic flexure.  Attention was turned back to the stomach.  An OG was exchanged for an NG with confirmation of intragastric location.  Just inferior to the stomach and adjacent to the gallbladder, a 2 x 2 centimeter nodule was palpated within the omentum concerning for possible tumor implant.  This was grasped with a Babcock and monopolar electrocautery was used to excise the lesion, first identifying the duodenum posteriorly.  Some bleeding was encountered during this removal from a mesenteric vessel which was suture-ligated with a figure-of-eight using 2-0 Vicryl.  Good hemostasis was noted.  The  right upper abdomen was again palpated with findings noted above.  Given dense adhesions along the left anterior abdomen, further lysis of adhesions was abandoned given no palpable disease within the lesser sac, right diaphragm or liver, or along the left abdominal sidewall of the splenic flexure.  At this time, attention was turned to the pelvis.The small and large bowel were packed out of the way of the surgical field with moist laparotomy sponges and malleable retractors were attached to the [place].  The pelvis was examined with findings noted above.  Several sigmoid epiploica had tumor versus treated tumor implants which were excised using monopolar electrocautery.  There were several small nodules along the sidewall peritoneum, suspected to be related to prior surgical history.  These were all fulgurated.  Some thickening although no nodularity was noted along the bladder peritoneum.  Some of the peritoneum was grasped with an Allis and monopolar electrocautery was used to strip the peritoneum in this area.  Peritoneum overlying the bladder serosa was then reapproximated with 2-0 Vicryl in running fashion.  The abdomen pelvis were copiously irrigated with good hemostasis noted. The retractor and laparotomy sponges were removed.  The small bowel was again run from the terminal ileum to the ligament of Treitz.  The transverse colon was inspected as well as the greater curvature of the stomach, splenic flexure, and mesentery adjacent to the gallbladder.  Hemostasis was noted.  Surgiflo was placed within the defect from the excision of the a peri-gastric nodule and pressure held.   The fascia was then closed using running mass closure of #1 PDS. The subcutaneous tissues were irrigated and made hemostatic. 20 mL of Exparel  within 20 mL of quarter percent Marcaine and 20 mL of normal saline was injected for postoperative pain control.  Cutaneous tissue was reapproximated using 2-0 Vicryl in running  fashion.  The superficial subcutaneous tissue was brought together using 4-0 Vicryl to take the tension off of the skin edge and 0 Monocryl was used in subcuticular fashion to close the skin.  Dermabond was placed over the incision followed by a honeycomb dressing.  All instrument, suture, laparotomy, Ray-Tec, and needle counts were correct x2. The patient tolerated the procedure well and was taken recovery room in stable condition.   Jeral Pinch MD Gynecologic Oncology

## 2022-11-11 NOTE — Anesthesia Procedure Notes (Signed)
Procedure Name: Intubation Date/Time: 11/11/2022 1:37 PM  Performed by: West Pugh, CRNAPre-anesthesia Checklist: Patient identified, Emergency Drugs available, Suction available, Patient being monitored and Timeout performed Patient Re-evaluated:Patient Re-evaluated prior to induction Oxygen Delivery Method: Circle system utilized Preoxygenation: Pre-oxygenation with 100% oxygen Induction Type: IV induction Ventilation: Mask ventilation without difficulty Laryngoscope Size: Mac and 3 Grade View: Grade I Tube type: Oral Tube size: 7.0 mm Number of attempts: 1 Airway Equipment and Method: Stylet Placement Confirmation: ETT inserted through vocal cords under direct vision, positive ETCO2, CO2 detector and breath sounds checked- equal and bilateral Secured at: 21 cm Tube secured with: Tape Dental Injury: Teeth and Oropharynx as per pre-operative assessment

## 2022-11-11 NOTE — Transfer of Care (Signed)
Immediate Anesthesia Transfer of Care Note  Patient: Lauren Chandler  Procedure(s) Performed: OPEN OMENTECTOMY, PERITONEAL STRIPPING,  LYSIS OF ADHESIONS TUMOR DEBULKING  Patient Location: PACU  Anesthesia Type:General  Level of Consciousness: awake  Airway & Oxygen Therapy: Patient Spontanous Breathing and Patient connected to face mask oxygen  Post-op Assessment: Report given to RN and Post -op Vital signs reviewed and stable  Post vital signs: Reviewed and stable  Last Vitals:  Vitals Value Taken Time  BP 123/67 11/11/22 1701  Temp    Pulse 100 11/11/22 1702  Resp    SpO2 100 % 11/11/22 1702  Vitals shown include unvalidated device data.  Last Pain:  Vitals:   11/11/22 1243  TempSrc: Oral  PainSc:          Complications: No notable events documented.

## 2022-11-11 NOTE — Anesthesia Preprocedure Evaluation (Addendum)
Anesthesia Evaluation  Patient identified by MRN, date of birth, ID band Patient awake    Reviewed: Allergy & Precautions, NPO status , Patient's Chart, lab work & pertinent test results, reviewed documented beta blocker date and time   Airway Mallampati: II  TM Distance: >3 FB Neck ROM: Full    Dental  (+) Dental Advisory Given, Missing, Chipped,    Pulmonary neg pulmonary ROS,    Pulmonary exam normal breath sounds clear to auscultation       Cardiovascular hypertension, Pt. on medications and Pt. on home beta blockers + CAD  + Valvular Problems/Murmurs MR and AI  Rhythm:Regular Rate:Normal + Systolic murmurs Echo 49/44/96: 1. Left ventricular ejection fraction, by estimation, is 40 to 45%. The  left ventricle has mildly decreased function. The left ventricle  demonstrates global hypokinesis. Left ventricular diastolic parameters are  indeterminate.  2. Right ventricular systolic function is normal. The right ventricular  size is normal.  3. The mitral valve is normal in structure. Mild mitral valve  regurgitation. No evidence of mitral stenosis.  4. The aortic valve is normal in structure. Aortic valve regurgitation is  mild to moderate. Aortic valve sclerosis/calcification is present, without  any evidence of aortic stenosis.  5. The inferior vena cava is normal in size with greater than 50%  respiratory variability, suggesting right atrial pressure of 3 mmHg.    Neuro/Psych  Neuromuscular disease    GI/Hepatic Neg liver ROS, GERD  Medicated,  Endo/Other  Hypothyroidism   Renal/GU Renal disease (AKI)     Musculoskeletal negative musculoskeletal ROS (+)   Abdominal   Peds  Hematology  (+) Blood dyscrasia (Thrombocytopenia), anemia , Hodgkin's lymphoma status post XRT   Anesthesia Other Findings Day of surgery medications reviewed with the patient.  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   Reproductive/Obstetrics                           Anesthesia Physical Anesthesia Plan  ASA: 4  Anesthesia Plan: General   Post-op Pain Management: Tylenol PO (pre-op)*   Induction: Intravenous  PONV Risk Score and Plan: 3 and Midazolam, Dexamethasone and Ondansetron  Airway Management Planned: Oral ETT  Additional Equipment: Arterial line  Intra-op Plan:   Post-operative Plan: Extubation in OR  Informed Consent: I have reviewed the patients History and Physical, chart, labs and discussed the procedure including the risks, benefits and alternatives for the proposed anesthesia with the patient or authorized representative who has indicated his/her understanding and acceptance.     Dental advisory given  Plan Discussed with: CRNA  Anesthesia Plan Comments: (2nd PIV after induction)        Anesthesia Quick Evaluation

## 2022-11-11 NOTE — Brief Op Note (Signed)
11/11/2022  4:52 PM  PATIENT:  Lauren Chandler  57 y.o. female  PRE-OPERATIVE DIAGNOSIS:  PRIMARY PERITONEAL CANCER  POST-OPERATIVE DIAGNOSIS:  PRIMARY PERITONEAL CANCER  PROCEDURE:  Procedure(s): OPEN OMENTECTOMY, PERITONEAL STRIPPING,  LYSIS OF ADHESIONS (N/A) TUMOR DEBULKING (N/A)  SURGEON:  Surgeon(s) and Role:    * Lafonda Mosses, MD - Primary  ASSISTANTS: Joylene John NP   ANESTHESIA:   general  EBL:  300 mL   BLOOD ADMINISTERED:none  DRAINS: none   LOCAL MEDICATIONS USED:  exparel and marcaine  SPECIMEN:  omentum, peri-gastric nodule, small bowel mesenteric nodules, sigmoid epiploica nodules, bladder peritoneum, posterior cul de sac  DISPOSITION OF SPECIMEN:  PATHOLOGY  COUNTS:  YES  TOURNIQUET:  * No tourniquets in log *  DICTATION: .Note written in EPIC  PLAN OF CARE: Discharge to home after PACU  PATIENT DISPOSITION:  PACU - hemodynamically stable.   Delay start of Pharmacological VTE agent (>24hrs) due to surgical blood loss or risk of bleeding: no

## 2022-11-11 NOTE — Anesthesia Postprocedure Evaluation (Signed)
Anesthesia Post Note  Patient: Lauren Chandler  Procedure(s) Performed: OPEN OMENTECTOMY, PERITONEAL STRIPPING,  LYSIS OF ADHESIONS TUMOR DEBULKING     Patient location during evaluation: PACU Anesthesia Type: General Level of consciousness: awake and alert Pain management: pain level controlled Vital Signs Assessment: post-procedure vital signs reviewed and stable Respiratory status: spontaneous breathing, nonlabored ventilation, respiratory function stable and patient connected to nasal cannula oxygen Cardiovascular status: blood pressure returned to baseline and stable Postop Assessment: no apparent nausea or vomiting Anesthetic complications: no   No notable events documented.  Last Vitals:  Vitals:   11/11/22 1745 11/11/22 1800  BP: 113/62 118/69  Pulse: 95 97  Resp:    Temp:    SpO2: 92% 94%    Last Pain:  Vitals:   11/11/22 1800  TempSrc:   PainSc: [place]

## 2022-11-11 NOTE — Anesthesia Procedure Notes (Signed)
Date/Time: 11/11/2022 4:49 PM  Performed by: Cynda Familia, CRNAOxygen Delivery Method: Simple face mask Placement Confirmation: positive ETCO2 and breath sounds checked- equal and bilateral Dental Injury: Teeth and Oropharynx as per pre-operative assessment

## 2022-11-11 NOTE — H&P (Signed)
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 Small left pleural effusion  2. 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[place])   07/23/2022 Cancer Staging   Staging form: Ovary, Fallopian Tube, and Primary Peritoneal Carcinoma, AJCC 8th Edition - Clinical stage from 07/23/2022: FIGO Stage IIIC (cT3c, cN0, cM0) - Signed by Heath Lark, MD on 07/23/2022 Stage prefix: Initial diagnosis   07/23/2022 Pathology Results   FINAL MICROSCOPIC DIAGNOSIS:  - Malignant cells present  - See comment   SPECIMEN ADEQUACY:  Satisfactory for evaluation   DIAGNOSTIC COMMENTS:  Immunohistochemical stains show that the tumor cells are positive for ER, PAX8, p16 and p53 (clonal overexpression pattern), consistent with a high-grade serous carcinoma.  Immunostain for WT1 is also diffusely positive in the tumor cells, most consistent with an ovarian primary.  Dr. Arby Barrette reviewed the case and concurs with the above diagnosis.    07/30/2022 Procedure   Successful right chest port placement via the right internal jugular vein. The port is ready for immediate use.   08/02/2022 -  Chemotherapy   Patient is on Treatment Plan : OVARIAN Carboplatin (AUC 6) + Paclitaxel (175) q21d X 6 Cycles     08/03/2022 Procedure   Successful ultrasound-guided paracentesis yielding 4.1 liters of peritoneal fluid.   08/09/2022 Tumor Marker   Patient's tumor was tested for the following markers: CA-125. Results of the tumor marker test revealed 452.   08/12/2022 Echocardiogram    1. Left ventricular ejection fraction, by estimation, is 40 to 45%. The left ventricle has mildly decreased function. The left ventricle demonstrates global hypokinesis. Left ventricular diastolic parameters are indeterminate.   2. Right ventricular systolic function is normal.  The right ventricular size is normal.   3. The mitral valve is normal in structure. Mild mitral valve regurgitation. No evidence of mitral stenosis.   4. The aortic valve is normal in structure. Aortic valve regurgitation is mild to moderate. Aortic valve sclerosis/calcification is present, without any evidence of aortic stenosis.   5. The inferior vena cava is normal in size with greater than 50% respiratory variability, suggesting right atrial pressure of 3 mmHg.    09/23/2022 Tumor Marker   Patient's tumor was tested for the following markers: CA-125. Results of the tumor marker test revealed 41.3.   10/07/2022 Imaging   1. Compared to the outside abdominopelvic CT of 07/19/2022, significant improvement in peritoneal carcinomatosis, without bowel obstruction or other acute complication. 2. Decreased size of upper abdominal nodes, possibly also representing response to therapy. 3. No new or progressive disease and no evidence of thoracic metastasis. 4. Bladder wall thickening and mucosal hyperenhancement are suspicious for cystitis. 5. 4 mm left upper lobe pulmonary nodule is most likely benign/incidental but can be re-evaluated at follow-up. 6. Aortic valvular calcifications. Consider echocardiography to evaluate for valvular dysfunction. 7. Coronary artery atherosclerosis. Aortic Atherosclerosis (ICD10-I70.0).       Interval History: The patient underwent angiography on 1/4. Findings included mild obstructive CAD of RCA and LAD.  Otherwise, doing well since her last visit with me.  Past Medical/Surgical History: Past Medical History:  Diagnosis Date   Anemia    BRCA2 gene mutation positive 01/08/2019   Breast cancer ([place]) 2007   Left   GERD (gastroesophageal reflux disease)    Hodgkin's disease ([place]) 10/26/1987   clinical   Hypothyroidism    Malignant tumor of  breast ([place])    left   Pneumonia    Thyroid dysfunction    2020, as results on Hodgkin's radiation    Past  Surgical History:  Procedure Laterality Date   BREAST RECONSTRUCTION     2009   COLONOSCOPY     2017   excisional of bilateral breast     2007   Hysteroscopy w.biopsy     2012   IR IMAGING GUIDED PORT INSERTION  07/29/2022   IR PARACENTESIS  07/23/2022   KNEE ARTHROSCOPY     1985   laparoscopically assisted vaginal hysterectomy w/removal of tubes and/or ovaries  04/23/2019   LYMPH NODE BIOPSY  1989   MASTECTOMY Bilateral 2007   removal of spleen total     1989   RIGHT/LEFT HEART CATH AND CORONARY ANGIOGRAPHY N/A 10/28/2022   Procedure: RIGHT/LEFT HEART CATH AND CORONARY ANGIOGRAPHY;  Surgeon: Early Osmond, MD;  Location: [facility];  Service: Cardiovascular;  Laterality: N/A;    Family History  Problem Relation Age of Onset   Prostate cancer Father    Prostate cancer Brother    Colon cancer Maternal Grandmother    Breast cancer Paternal Grandmother    Ovarian cancer Neg Hx    Endometrial cancer Neg Hx    Pancreatic cancer Neg Hx     Social History   Socioeconomic History   Marital status: Married    Spouse name: Not on file   Number of children: Not on file   Years of education: Not on file   Highest education level: Not on file  Occupational History   Occupation: retired  Tobacco Use   Smoking status: Never   Smokeless tobacco: Never  Vaping Use   Vaping Use: Never used  Substance and Sexual Activity   Alcohol use: Yes    Comment: rare   Drug use: Never   Sexual activity: Yes  Other Topics Concern   Not on file  Social History Narrative   Not on file   Social Determinants of Health   Financial Resource Strain: Low Risk  (08/02/2022)   Overall Financial Resource Strain (CARDIA)    Difficulty of Paying Living Expenses: Not very hard  Food Insecurity: No Food Insecurity (08/02/2022)   Hunger Vital Sign    Worried About Running Out of Food in the Last Year: Never true    Ran Out of Food in the Last Year: Never true  Transportation Needs: No  Transportation Needs (08/02/2022)   PRAPARE - Hydrologist (Medical): No    Lack of Transportation (Non-Medical): No  Physical Activity: Not on file  Stress: Not on file  Social Connections: Not on file    Current Medications:  Current Facility-Administered Medications:    ceFAZolin (ANCEF) IVPB 2g/100 mL premix, 2 g, Intravenous, On Call, Cross, Melissa D, NP   chlorhexidine (PERIDEX) 0.12 % solution 15 mL, 15 mL, Mouth/Throat, Once **OR** Oral care mouth rinse, 15 mL, Mouth Rinse, Once, Effie Berkshire, MD   dexamethasone (DECADRON) injection 4 mg, 4 mg, Intravenous, On Call to OR, Cross, Lenna Sciara D, NP   [START ON 11/12/2022] feeding supplement (ENSURE PRE-SURGERY) liquid 296 mL, 296 mL, Oral, Once, Cross, Melissa D, NP   feeding supplement (ENSURE PRE-SURGERY) liquid 592 mL, 592 mL, Oral, Once, Cross, Melissa D, NP   ketorolac (TORADOL) 15 MG/ML injection 15 mg, 15 mg, Intravenous, On Call to OR, Cross, Melissa D, NP   lactated ringers infusion, , Intravenous, Continuous,  Effie Berkshire, MD   povidone-iodine 10 % swab 2 Application, 2 Application, Topical, Once, Cross, Melissa D, NP   scopolamine (TRANSDERM-SCOP) 1 MG/3DAYS 1.5 mg, 1 patch, Transdermal, On Call to OR, Cross, Melissa D, NP  Review of Systems: + Fatigue, numbness Denies appetite changes, fevers, chills, unexplained weight changes. Denies hearing loss, neck lumps or masses, mouth sores, ringing in ears or voice changes. Denies cough or wheezing.  Denies shortness of breath. Denies chest pain or palpitations. Denies leg swelling. Denies abdominal distention, pain, blood in stools, constipation, diarrhea, nausea, vomiting, or early satiety. Denies pain with intercourse, dysuria, frequency, hematuria or incontinence. Denies hot flashes, pelvic pain, vaginal bleeding or vaginal discharge.   Denies joint pain, back pain or muscle pain/cramps. Denies itching, rash, or wounds. Denies dizziness,  headaches or seizures. Denies swollen lymph nodes or glands, denies easy bruising or bleeding. Denies anxiety, depression, confusion, or decreased concentration.  Physical Exam: Ht '5\' 3"'$  (1.6 m)   Wt 158 lb 9.6 oz (71.9 kg)   BMI 28.09 kg/m  General: Alert, oriented, no acute distress. HEENT: Normocephalic, atraumatic, sclera anicteric. Chest: Lungs clear to auscultation bilaterally, no wheezes or rhonchi. Cardiovascular: HR in 90s, regular rhythm, no murmurs. Abdomen: Obese, soft, nontender.  Normoactive bowel sounds.  No masses or hepatosplenomegaly appreciated.  Well-healed scar. Extremities: Grossly normal range of motion.  Warm, well perfused.  No edema bilaterally.  Laboratory & Radiologic Studies:    Latest Ref Rng & Units 11/01/2022    1:49 PM 10/28/2022    9:24 AM 10/28/2022    9:22 AM  CBC  WBC 4.0 - 10.5 K/uL 7.8     Hemoglobin 12.0 - 15.0 g/dL 8.8  8.5  8.8   Hematocrit 36.0 - 46.0 % 27.3  25.0  26.0   Platelets 150 - 400 K/uL 131         Latest Ref Rng & Units 11/01/2022    1:49 PM 10/28/2022    9:24 AM 10/28/2022    9:22 AM  BMP  Glucose 70 - 99 mg/dL 107     BUN 6 - 20 mg/dL 28     Creatinine 0.44 - 1.00 mg/dL 1.06     Sodium 135 - 145 mmol/L 135  140  139   Potassium 3.5 - 5.1 mmol/L 4.2  3.9  3.9   Chloride 98 - 111 mmol/L 99     CO2 22 - 32 mmol/L 25     Calcium 8.9 - 10.3 mg/dL 9.3      Assessment & Plan: Lauren Chandler is a 57 y.o. 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 See counseling from 10/08/22.   Jeral Pinch, MD  Division of Gynecologic Oncology  Department of Obstetrics and Gynecology  [facility]

## 2022-11-12 ENCOUNTER — Other Ambulatory Visit: Payer: Self-pay | Admitting: Hematology and Oncology

## 2022-11-12 ENCOUNTER — Other Ambulatory Visit: Payer: Self-pay

## 2022-11-12 ENCOUNTER — Encounter (HOSPITAL_COMMUNITY): Payer: Self-pay | Admitting: Gynecologic Oncology

## 2022-11-12 LAB — HEMOGLOBIN AND HEMATOCRIT, BLOOD
HCT: 25.1 % — ABNORMAL LOW (ref 36.0–46.0)
Hemoglobin: 8.1 g/dL — ABNORMAL LOW (ref 12.0–15.0)

## 2022-11-12 LAB — BASIC METABOLIC PANEL
Anion gap: 10 (ref 5–15)
BUN: 17 mg/dL (ref 6–20)
CO2: 22 mmol/L (ref 22–32)
Calcium: 8.3 mg/dL — ABNORMAL LOW (ref 8.9–10.3)
Chloride: 104 mmol/L (ref 98–111)
Creatinine, Ser: 0.79 mg/dL (ref 0.44–1.00)
GFR, Estimated: >60 mL/min (ref >60)
Glucose, Bld: 179 mg/dL — ABNORMAL HIGH (ref 70–99)
Potassium: 4.3 mmol/L (ref 3.5–5.1)
Sodium: 136 mmol/L (ref 135–145)

## 2022-11-12 LAB — CBC
HCT: 26.2 % — ABNORMAL LOW (ref 36.0–46.0)
Hemoglobin: 8.6 g/dL — ABNORMAL LOW (ref 12.0–15.0)
MCH: 31.6 pg (ref 26.0–34.0)
MCHC: 32.8 g/dL (ref 30.0–36.0)
MCV: 96.3 fL (ref 80.0–100.0)
Platelets: 411 10*3/uL — ABNORMAL HIGH (ref 150–400)
RBC: 2.72 MIL/uL — ABNORMAL LOW (ref 3.87–5.11)
RDW: 27.9 % — ABNORMAL HIGH (ref 11.5–15.5)
WBC: 12 10*3/uL — ABNORMAL HIGH (ref 4.0–10.5)
nRBC: 0 % (ref 0.0–0.2)

## 2022-11-12 MED ORDER — ACETAMINOPHEN 325 MG PO TABS
650.0000 mg | ORAL_TABLET | Freq: Four times a day (QID) | ORAL | Status: DC | PRN
  Administered 2022-11-12 – 2022-11-15 (×6): 650 mg via ORAL
  Filled 2022-11-12 (×6): qty 2

## 2022-11-12 MED ORDER — OXYCODONE HCL 5 MG PO TABS
5.0000 mg | ORAL_TABLET | ORAL | Status: DC | PRN
  Administered 2022-11-12 – 2022-11-14 (×8): 5 mg via ORAL
  Filled 2022-11-12 (×8): qty 1

## 2022-11-12 MED ORDER — OXYCODONE HCL 5 MG PO TABS: 5.0000 mg | ORAL_TABLET | ORAL | Status: DC | PRN

## 2022-11-12 MED ORDER — METOPROLOL SUCCINATE ER 25 MG PO TB24
12.5000 mg | ORAL_TABLET | Freq: Every day | ORAL | Status: DC
  Administered 2022-11-12 – 2022-11-13 (×2): 12.5 mg via ORAL
  Filled 2022-11-12 (×3): qty 1

## 2022-11-12 MED ORDER — RIVAROXABAN 10 MG PO TABS: 10.0000 mg | ORAL_TABLET | Freq: Every day | ORAL | 0 refills | Status: DC

## 2022-11-12 MED ORDER — CHLORHEXIDINE GLUCONATE CLOTH 2 % EX PADS: 6.0000 | MEDICATED_PAD | Freq: Every day | CUTANEOUS | Status: DC

## 2022-11-12 NOTE — Plan of Care (Signed)
  Problem: Education: Goal: Knowledge of the prescribed therapeutic regimen will improve Outcome: Progressing   Problem: Activity: Goal: Risk for activity intolerance will decrease Outcome: Progressing   Problem: Pain Managment: Goal: General experience of comfort will improve Outcome: Progressing   Problem: Safety: Goal: Ability to remain free from injury will improve Outcome: Progressing

## 2022-11-12 NOTE — Plan of Care (Signed)
  Problem: Education: Goal: Understanding of CV disease, CV risk reduction, and recovery process will improve Outcome: Progressing   Problem: Activity: Goal: Ability to return to baseline activity level will improve Outcome: Progressing   Problem: Pain Managment: Goal: General experience of comfort will improve Outcome: Progressing

## 2022-11-12 NOTE — Progress Notes (Signed)
Mobility Specialist - Progress Note   11/12/22 1131  Mobility  Activity Ambulated with assistance in hallway  Level of Assistance Contact guard assist, steadying assist  Assistive Device Front wheel walker  Distance Ambulated (ft) 52 ft  Range of Motion/Exercises Active  Activity Response Tolerated well  Mobility Referral Yes  $Mobility charge 1 Mobility   Pt was found in bed and agreeable to ambulate. Had persistent abdominal pain throughout session. At EOS returned to bed with necessities in reach and husband in room.  Ferd Hibbs Mobility Specialist

## 2022-11-12 NOTE — Progress Notes (Signed)
1 Day Post-Op Procedure(s) (LRB): OPEN OMENTECTOMY, PERITONEAL STRIPPING,  LYSIS OF ADHESIONS (N/A) TUMOR DEBULKING (N/A)  Subjective: Patient reports more soreness on the right abdomen. No flatus. No nausea or emesis reported. All questions answered. Husband at the bedside.  Objective: Vital signs in last 24 hours: Temp:  [97.4 F (36.3 C)-98.6 F (37 C)] 97.8 F (36.6 C) (01/19 0623) Pulse Rate:  [94-100] 98 (01/19 0623) Resp:  [14-18] 16 (01/19 0623) BP: (106-130)/(62-98) 106/64 (01/19 0623) SpO2:  [89 %-100 %] 95 % (01/19 0623) Weight:  [158 lb 9.6 oz (71.9 kg)] 158 lb 9.6 oz (71.9 kg) (01/18 1134) Last BM Date : 11/10/22  Intake/Output from previous day: 01/18 0701 - 01/19 0700 In: 3535.9 [I.V.:3185.9; IV Piggyback:350] Out: 3212 [Urine:1450; Blood:300]  Physical Examination: General: alert, cooperative, and no distress Resp: expiratory wheezes noted in right upper lobe Cardio: regular rate and rhythm, S1, S2 normal, no murmur, click, rub or gallop GI: incision: midline incision with op site dressing in place with no drainage note underneath and abdomen soft, faint bowel sounds, non distended Extremities: extremities normal, atraumatic, no cyanosis or edema NG tube to intermittent suction, no output in canister Foley with clear yellow urine  Labs: WBC/Hgb/Hct/Plts:  12.0/8.6/26.2/411 (01/19 0330) BUN/Cr/glu/ALT/AST/amyl/lip:  17/0.79/--/--/--/--/-- (01/19 0330)  Assessment: 57 y.o. s/p Procedure(s): OPEN OMENTECTOMY, PERITONEAL STRIPPING,  LYSIS OF ADHESIONS TUMOR DEBULKING: stable Pain:  Pain is well-controlled on PRN medications.  Heme: Hgb 8.6 and Hct 26.2 this am-stable and appropriate. Anemia related to chemotherapy preop.  ID: WBC 12.0 this am. Given decadron intra-op.  CV: BP and HR stable. Continue to monitor while inpatient.  GI:  Tolerating po: NPO due to NG. NG removed. Plan for sips of clears.  GU: Foley in place. Creatinine 0.79 this am. Adequate  output recorded. Foley to be removed.    FEN: No critical values on am labs.  Prophylaxis: Lovenox and SCDs.  Plan: NG tube to be removed today Start sips of clear liquids Foley to be removed Abdominal binder Encourage IS and pulm toileting given mild wheezing Encourage ambulation and increasing mobility Am labs Continue plan of care per Dr. Berline Lopes   LOS: 1 day    Nadea Kirkland D Jackalyn Haith 11/12/2022, 7:52 AM

## 2022-11-12 NOTE — TOC CM/SW Note (Signed)
Transition of Care [facility]) Screening Note  Patient Details  Name: Lauren Chandler Date of Birth: 05/26/1966  [facility]) CM/SW Contact:    Sherie Don, LCSW Phone Number: 11/12/2022, 8:43 AM  Transition of Care Department [facility]) has reviewed patient and no TOC needs have been identified at this time. We will continue to monitor patient advancement through interdisciplinary progression rounds. If new patient transition needs arise, please place a TOC consult.

## 2022-11-12 NOTE — Discharge Instructions (Addendum)
AFTER SURGERY INSTRUCTIONS   Return to work: 4-6 weeks if applicable  You will need to come to the lab on Thursday, January 25 for repeat lab work. We will contact you with the results.  You need to start taking daily magnesium. Monitor for diarrhea and contact the office.   You will have a white honeycomb dressing over your larger incision. This dressing can be removed 5 days after surgery and you do not need to reapply a new dressing. Once you remove the dressing, you will notice that you have the surgical glue (dermabond) on the incision and this will peel off on its own. You can get this dressing wet in the shower the days after surgery prior to removal on the 5th day.    You will need to be on blood clot prevention medication for 4 weeks after surgery. We have sent in a medication called Xarelto which you take once a day at the same time each day. This is a blood thinner so monitor for abnormal bleeding and call the office right away.   You are to begin the xarelto on the day after you leave the hospital if you received the blood thinner injection, lovenox, prior to leaving the hospital.    Activity: 1. Be up and out of the bed during the day.  Take a nap if needed.  You may walk up steps but be careful and use the hand rail.  Stair climbing will tire you more than you think, you may need to stop part way and rest.    2. No lifting or straining for 6 weeks over 10 pounds. No pushing, pulling, straining for 6 weeks.   3. No driving for around 9-62 days.  Do not drive if you are taking narcotic pain medicine and make sure that your reaction time has returned.    4. You can shower as soon as the next day after surgery. Shower daily.  Use your regular soap and water (not directly on the incision) and pat your incision(s) dry afterwards; don't rub.  No tub baths or submerging your body in water until cleared by your surgeon. If you have the soap that was given to you by pre-surgical testing that  was used before surgery, you do not need to use it afterwards because this can irritate your incisions.    5. No sexual activity and nothing in the vagina for 4 weeks.   6. You may experience a small amount of clear drainage from your incision, which is normal.  If the drainage persists, increases, or changes color please call the office.   7. Do not use creams, lotions, or ointments such as neosporin on your incision after surgery until advised by your surgeon because they can cause removal of the dermabond glue on your incisions.     8. Take Tylenol first for pain if you are able to take these medication and only use narcotic pain medication for severe pain not relieved by the Tylenol.  Monitor your Tylenol intake to a max of 4,000 mg in a 24 hour period. Avoid use of NSAIDS (advil, aleve) since you will be taking a blood thinner.    Diet: 1. Low sodium Heart Healthy Diet is recommended but you are cleared to resume your normal (before surgery) diet after your procedure.   2. It is safe to use a laxative, such as Miralax or Colace, if you have difficulty moving your bowels. You have been prescribed Sennakot-S to take at  bedtime every evening after surgery to keep bowel movements regular and to prevent constipation.     Wound Care: 1. Keep clean and dry.  Shower daily.   Reasons to call the Doctor: Fever - Oral temperature greater than 100.4 degrees Fahrenheit Foul-smelling vaginal discharge Difficulty urinating Nausea and vomiting Increased pain at the site of the incision that is unrelieved with pain medicine. Difficulty breathing with or without chest pain New calf pain especially if only on one side Sudden, continuing increased vaginal bleeding with or without clots.   Contacts: For questions or concerns you should contact:   Dr. Jeral Pinch at [phone]     Joylene John, NP at [phone]   After Hours: call [phone] and have the GYN Oncologist paged/contacted  (after 5 pm or on the weekends).   Messages sent via mychart are for non-urgent matters and are not responded to after hours so for urgent needs, please call the after hours number.

## 2022-11-13 LAB — CBC
HCT: 24.6 % — ABNORMAL LOW (ref 36.0–46.0)
Hemoglobin: 8.4 g/dL — ABNORMAL LOW (ref 12.0–15.0)
MCH: 32.9 pg (ref 26.0–34.0)
MCHC: 34.1 g/dL (ref 30.0–36.0)
MCV: 96.5 fL (ref 80.0–100.0)
Platelets: 370 10*3/uL (ref 150–400)
RBC: 2.55 MIL/uL — ABNORMAL LOW (ref 3.87–5.11)
RDW: 28.6 % — ABNORMAL HIGH (ref 11.5–15.5)
WBC: 15.3 10*3/uL — ABNORMAL HIGH (ref 4.0–10.5)
nRBC: 0.1 % (ref 0.0–0.2)

## 2022-11-13 LAB — BASIC METABOLIC PANEL
Anion gap: 6 (ref 5–15)
BUN: 13 mg/dL (ref 6–20)
CO2: 26 mmol/L (ref 22–32)
Calcium: 8.7 mg/dL — ABNORMAL LOW (ref 8.9–10.3)
Chloride: 106 mmol/L (ref 98–111)
Creatinine, Ser: 0.86 mg/dL (ref 0.44–1.00)
GFR, Estimated: >60 mL/min (ref >60)
Glucose, Bld: 95 mg/dL (ref 70–99)
Potassium: 4.7 mmol/L (ref 3.5–5.1)
Sodium: 138 mmol/L (ref 135–145)

## 2022-11-13 MED ORDER — SENNA 8.6 MG PO TABS
2.0000 | ORAL_TABLET | Freq: Every day | ORAL | Status: DC
  Administered 2022-11-13 – 2022-11-15 (×3): 17.2 mg via ORAL
  Filled 2022-11-13 (×3): qty 2

## 2022-11-13 MED ORDER — DIPHENHYDRAMINE HCL 25 MG PO CAPS
25.0000 mg | ORAL_CAPSULE | ORAL | Status: DC | PRN
  Administered 2022-11-13 – 2022-11-14 (×4): 25 mg via ORAL
  Filled 2022-11-13 (×4): qty 1

## 2022-11-13 NOTE — Progress Notes (Signed)
2 Days Post-Op Procedure(s) (LRB): OPEN OMENTECTOMY, PERITONEAL STRIPPING,  LYSIS OF ADHESIONS (N/A) TUMOR DEBULKING (N/A)  Subjective: Patient reports doing well. Pain controlled with oral pain medications overnight. Denies flatus but feels things "rumbling" in her belly. Ate some breakfast this morning without nausea. Voiding freely. Ambulated some yesterday. Denies dizziness or shortness of breath. Endorses some pruritus over much of her abdomen, similar to after her hysterectomy.  Objective: Vital signs in last 24 hours: Temp:  [97.3 F (36.3 C)-98.2 F (36.8 C)] 98.1 F (36.7 C) (01/20 0557) Pulse Rate:  [94-105] 94 (01/20 0557) Resp:  [14-20] 16 (01/20 0557) BP: (89-112)/(53-68) 97/55 (01/20 0557) SpO2:  [94 %-97 %] 95 % (01/20 0557) Last BM Date : 11/10/22  Intake/Output from previous day: 01/19 0701 - 01/20 0700 In: 992 [P.O.:240; I.V.:752] Out: 2200 [Urine:2200]  Physical Examination: Gen: alert, cooperative, and no distress HEENT: normocephalic, atraumatic Pulm: lungs clear to auscultation bilaterally, no wheezes CV: regular rate and rhythm, S1, S2 normal, no murmur, click, rub or gallop Abd: soft, nondistended, minimal asymmetry along mid to inferior aspect of the incision (fullness along right side) unchanged from exam yesterday, mildly hypoactive bowel sounds although much improved from yesterday. Incision is clean, dry and intact with honeycomb dressing in place. Extremities: normal range of motion, no calf tenderness, no edema, SCDs on  Labs:    Latest Ref Rng & Units 11/13/2022    4:35 AM 11/12/2022    7:36 PM 11/12/2022    3:30 AM  CBC  WBC 4.0 - 10.5 K/uL 15.3   12.0   Hemoglobin 12.0 - 15.0 g/dL 8.4  8.1  8.6   Hematocrit 36.0 - 46.0 % 24.6  25.1  26.2   Platelets 150 - 400 K/uL 370   411       Latest Ref Rng & Units 11/13/2022    4:35 AM 11/12/2022    3:30 AM 11/01/2022    1:49 PM  BMP  Glucose 70 - 99 mg/dL 95  179  107   BUN 6 - 20 mg/dL '13  17  28    '$ Creatinine 0.44 - 1.00 mg/dL 0.86  0.79  1.06   Sodium 135 - 145 mmol/L 138  136  135   Potassium 3.5 - 5.1 mmol/L 4.7  4.3  4.2   Chloride 98 - 111 mmol/L 106  104  99   CO2 22 - 32 mmol/L '26  22  25   '$ Calcium 8.9 - 10.3 mg/dL 8.7  8.3  9.3    Assessment:  57 y.o. s/p Procedure(s): OPEN OMENTECTOMY, PERITONEAL STRIPPING,  LYSIS OF ADHESIONS TUMOR DEBULKING: doing well.  Post-op: doing well. Meeting most milestones. Awaiting return of bowel function. Bowel regimen added. Continue to advance diet today. Will leave IVFs at 50 cc until taking adequate liquids. Encouraged time in chair today and multiple walks.   Pain: well controlled on oral regimen now.   Heme: chronic anemia in the setting of malignancy and recent chemotherapy. No significant change postop. Continues to be asymptomatic. Will monitor. Excellent urine output.  Leukocytosis: asymptomatic. Afebrile. In the setting of recent surgery and steroid administration, suspect reactive. Will continue to monitor.  Hypothyroidism: continue home levothyroxine.  Prophylaxis: lovenox, SCDs.  Plan: Advance diet, ambulate, await bowel function Dispo:  Discharge plan to include : none The patient is to be discharged to home. Likely discharge tomorrow.   LOS: 2 days    Lauren Chandler 11/13/2022, 7:55 AM

## 2022-11-13 NOTE — Progress Notes (Signed)
Mobility Specialist - Progress Note   11/13/22 1152  Mobility  Activity Ambulated with assistance in hallway  Level of Assistance Standby assist, set-up cues, supervision of patient - no hands on  Assistive Device Front wheel walker  Distance Ambulated (ft) 200 ft  Range of Motion/Exercises Active  Activity Response Tolerated well  Mobility Referral Yes  $Mobility charge 1 Mobility   Pt was found getting up from bed w/ NT to the bathroom. Pt agreeable to ambulate afterwards. Had some abdominal soreness during session. At EOS returned to recliner chair with necessities in reach and husband in room. RN notified pt was left on recliner chair.  Ferd Hibbs Mobility Specialist

## 2022-11-14 DIAGNOSIS — R112 Nausea with vomiting, unspecified: Secondary | ICD-10-CM

## 2022-11-14 LAB — CBC
HCT: 27 % — ABNORMAL LOW (ref 36.0–46.0)
Hemoglobin: 8.8 g/dL — ABNORMAL LOW (ref 12.0–15.0)
MCH: 32.2 pg (ref 26.0–34.0)
MCHC: 32.6 g/dL (ref 30.0–36.0)
MCV: 98.9 fL (ref 80.0–100.0)
Platelets: 451 10*3/uL — ABNORMAL HIGH (ref 150–400)
RBC: 2.73 MIL/uL — ABNORMAL LOW (ref 3.87–5.11)
WBC: 14.7 10*3/uL — ABNORMAL HIGH (ref 4.0–10.5)
nRBC: 0 % (ref 0.0–0.2)

## 2022-11-14 LAB — BASIC METABOLIC PANEL
Anion gap: 10 (ref 5–15)
BUN: 11 mg/dL (ref 6–20)
CO2: 26 mmol/L (ref 22–32)
Calcium: 8.8 mg/dL — ABNORMAL LOW (ref 8.9–10.3)
Chloride: 102 mmol/L (ref 98–111)
Creatinine, Ser: 0.99 mg/dL (ref 0.44–1.00)
GFR, Estimated: >60 mL/min (ref >60)
Glucose, Bld: 111 mg/dL — ABNORMAL HIGH (ref 70–99)
Potassium: 4.2 mmol/L (ref 3.5–5.1)
Sodium: 138 mmol/L (ref 135–145)

## 2022-11-14 MED ORDER — SODIUM CHLORIDE 0.9% FLUSH: 10.0000 mL | Freq: Two times a day (BID) | INTRAVENOUS | Status: DC

## 2022-11-14 MED ORDER — SODIUM CHLORIDE 0.9% FLUSH: 10.0000 mL | INTRAVENOUS | Status: DC | PRN

## 2022-11-14 MED ORDER — CHLORHEXIDINE GLUCONATE CLOTH 2 % EX PADS
6.0000 | MEDICATED_PAD | Freq: Every day | CUTANEOUS | Status: DC
  Administered 2022-11-14 – 2022-11-15 (×2): 6 via TOPICAL

## 2022-11-14 MED ORDER — CHLORHEXIDINE GLUCONATE CLOTH 2 % EX PADS
6.0000 | MEDICATED_PAD | Freq: Every day | CUTANEOUS | Status: DC
  Administered 2022-11-15: 6 via TOPICAL

## 2022-11-14 MED ORDER — TRAMADOL HCL 50 MG PO TABS
50.0000 mg | ORAL_TABLET | Freq: Four times a day (QID) | ORAL | Status: DC | PRN
  Administered 2022-11-14 – 2022-11-15 (×3): 50 mg via ORAL
  Filled 2022-11-14 (×3): qty 1

## 2022-11-14 MED ORDER — KCL IN DEXTROSE-NACL 20-5-0.45 MEQ/L-%-% IV SOLN
INTRAVENOUS | Status: DC
  Filled 2022-11-14 (×4): qty 1000

## 2022-11-14 NOTE — Progress Notes (Signed)
3 Days Post-Op Procedure(s) (LRB): OPEN OMENTECTOMY, PERITONEAL STRIPPING,  LYSIS OF ADHESIONS (N/A) TUMOR DEBULKING (N/A)  Subjective: Patient reports doing well. Feels little out of it after getting pain medication recently. PO pain medication is controlled her pain. Had some reflux this morning - got Protonix later than she would normally take it at home in the morning. Tolerating liquids and solids yesterday without nausea or emesis. Voiding freely (no day time UOP recorded but patient endorses voiding multiple times). + flatus yesterday and overnight. Ambulated multiple times yesterday outside of the room including without walker; feels steady on her feet without dizziness.  Objective: Vital signs in last 24 hours: Temp:  [97.5 F (36.4 C)-98.4 F (36.9 C)] 98.2 F (36.8 C) (01/21 0433) Pulse Rate:  [92-109] 92 (01/21 0433) Resp:  [17-18] 18 (01/21 0433) BP: (95-115)/(48-66) 108/48 (01/21 0433) SpO2:  [94 %-98 %] 94 % (01/21 0433) Last BM Date : 11/10/22  Intake/Output from previous day: 01/20 0701 - 01/21 0700 In: 360 [P.O.:360] Out: 1000 [Urine:1000]  Physical Examination: Gen: alert, cooperative, and no distress HEENT: normocephalic, atraumatic Pulm: lungs clear to auscultation bilaterally, no wheezes CV: HR in 90s, regular rhythm, no murmur, click, rub or gallop Abd: soft, mildly distended, minimal asymmetry along mid to inferior aspect of the incision (fullness along right side) unchanged from exam yesterday, good bowel sounds improved from yesterday. Incision is clean, dry and intact with honeycomb dressing in place. Extremities: normal range of motion, no calf tenderness, no edema, SCDs replaced  Labs:    Latest Ref Rng & Units 11/14/2022    4:40 AM 11/13/2022    4:35 AM 11/12/2022    7:36 PM  CBC  WBC 4.0 - 10.5 K/uL 14.7  15.3    Hemoglobin 12.0 - 15.0 g/dL 8.8  8.4  8.1   Hematocrit 36.0 - 46.0 % 27.0  24.6  25.1   Platelets 150 - 400 K/uL 451  370         Latest Ref Rng & Units 11/14/2022    4:40 AM 11/13/2022    4:35 AM 11/12/2022    3:30 AM  BMP  Glucose 70 - 99 mg/dL 111  95  179   BUN 6 - 20 mg/dL '11  13  17   '$ Creatinine 0.44 - 1.00 mg/dL 0.99  0.86  0.79   Sodium 135 - 145 mmol/L 138  138  136   Potassium 3.5 - 5.1 mmol/L 4.2  4.7  4.3   Chloride 98 - 111 mmol/L 102  106  104   CO2 22 - 32 mmol/L '26  26  22   '$ Calcium 8.9 - 10.3 mg/dL 8.8  8.7  8.3    Assessment:  57 y.o. s/p Procedure(s): OPEN OMENTECTOMY, PERITONEAL STRIPPING,  LYSIS OF ADHESIONS TUMOR DEBULKING: doing well.   Post-op: doing well. Meeting most milestones. Now endorsing return of bowel function. IV removed overnight, labs with some hemoconcentration. Discussed importance of increasing oral intake today - will simulate home. Will try tramadol rather than oxycodone to see if less sedating. Encouraged time in chair today and multiple walks again.    Pain: well controlled on oral regimen.    Heme: chronic anemia in the setting of malignancy and recent chemotherapy. No significant change postop. Continues to be asymptomatic. Will monitor. Excellent urine output.   Leukocytosis: decreased from yesterday even with other lab parameters that indicate hemoconcentration compared to labs yesterday. Asymptomatic. Afebrile. In the setting of recent surgery and steroid administration, suspect  reactive. Will continue to monitor.   Hypothyroidism: continue home levothyroxine.   Prophylaxis: lovenox, SCDs.   Plan: Advance diet, ambulate Dispo:  Discharge plan to include : none The patient is to be discharged to home. Likely discharge later today versus tomorrow.   LOS: 3 days    Lauren Chandler 11/14/2022, 7:35 AM

## 2022-11-14 NOTE — Progress Notes (Signed)
Entered in error

## 2022-11-14 NOTE — Progress Notes (Signed)
Mobility Specialist - Progress Note   11/14/22 1059  Mobility  Activity Ambulated with assistance to bathroom  Level of Assistance Standby assist, set-up cues, supervision of patient - no hands on  Assistive Device Front wheel walker  Distance Ambulated (ft) 25 ft  Activity Response Tolerated well  Mobility Referral Yes  $Mobility charge 1 Mobility   Pt received in bed requesting assistance to bathroom for a BM. Once returning to the bathroom pt stated she felt dizzy & needed to lay back down. C/o feeling weak. Nurse notified of occurrence. Will attempt ambulation this afternoon. Pt to bed after session with all needs met & husband in room.  [facility]

## 2022-11-14 NOTE — Progress Notes (Signed)
   11/13/22 2259  Assess: MEWS Score  Temp 98.4 F (36.9 C)  BP 95/64  MAP (mmHg) 74  Pulse Rate (!) 109  Resp 18  SpO2 94 %  O2 Device Room Air  Assess: MEWS Score  MEWS Temp 0  MEWS Systolic 1  MEWS Pulse 1  MEWS RR 0  MEWS LOC 0  MEWS Score 2  MEWS Score Color Yellow  Assess: if the MEWS score is Yellow or Red  Were vital signs taken at a resting state? Yes  Focused Assessment No change from prior assessment  Does the patient meet 2 or more of the SIRS criteria? No  MEWS guidelines implemented *See Row Information* Yes  Treat  MEWS Interventions Administered scheduled meds/treatments;Administered prn meds/treatments  Pain Scale 0-10  Pain Score 7  Pain Type Surgical pain  Pain Location Abdomen  Pain Orientation Right;Left  Pain Descriptors / Indicators Discomfort;Constant  Pain Frequency Constant  Patients Stated Pain Goal 3  Take Vital Signs  Increase Vital Sign Frequency  Yellow: Q 2hr X 2 then Q 4hr X 2, if remains yellow, continue Q 4hrs  Escalate  MEWS: Escalate Yellow: discuss with charge nurse/RN and consider discussing with provider and RRT  Notify: Charge Nurse/RN  Name of Charge Nurse/RN Notified chris  Date Charge Nurse/RN Notified 11/13/22  Time Charge Nurse/RN Notified 2215  Document  Patient Outcome Stabilized after interventions  Progress note created (see row info) Yes  Assess: SIRS CRITERIA  SIRS Temperature  0  SIRS Pulse 1  SIRS Respirations  0  SIRS WBC 0  SIRS Score Sum  1

## 2022-11-14 NOTE — Plan of Care (Signed)
  Problem: Education: Goal: Understanding of CV disease, CV risk reduction, and recovery process will improve Outcome: Progressing Goal: Individualized Educational Video(s) Outcome: Progressing   Problem: Activity: Goal: Ability to return to baseline activity level will improve Outcome: Progressing   

## 2022-11-14 NOTE — Progress Notes (Signed)
Pt states IV painful and burning. IV removed. Pt states she does not want a replacement IV. Pt BP soft but not abnormal since admission.

## 2022-11-15 LAB — PHOSPHORUS: Phosphorus: 4.8 mg/dL — ABNORMAL HIGH (ref 2.5–4.6)

## 2022-11-15 LAB — BASIC METABOLIC PANEL
Anion gap: 9 (ref 5–15)
BUN: 11 mg/dL (ref 6–20)
CO2: 28 mmol/L (ref 22–32)
Calcium: 8.9 mg/dL (ref 8.9–10.3)
Chloride: 102 mmol/L (ref 98–111)
Creatinine, Ser: 0.71 mg/dL (ref 0.44–1.00)
GFR, Estimated: >60 mL/min (ref >60)
Glucose, Bld: 96 mg/dL (ref 70–99)
Potassium: 4.2 mmol/L (ref 3.5–5.1)
Sodium: 139 mmol/L (ref 135–145)

## 2022-11-15 LAB — CBC
HCT: 23.5 % — ABNORMAL LOW (ref 36.0–46.0)
Hemoglobin: 7.5 g/dL — ABNORMAL LOW (ref 12.0–15.0)
MCH: 32.2 pg (ref 26.0–34.0)
MCHC: 31.9 g/dL (ref 30.0–36.0)
MCV: 100.9 fL — ABNORMAL HIGH (ref 80.0–100.0)
Platelets: 400 K/uL (ref 150–400)
RBC: 2.33 MIL/uL — ABNORMAL LOW (ref 3.87–5.11)
WBC: 11.1 K/uL — ABNORMAL HIGH (ref 4.0–10.5)
nRBC: 0.5 % — ABNORMAL HIGH (ref 0.0–0.2)

## 2022-11-15 LAB — SURGICAL PATHOLOGY

## 2022-11-15 LAB — MAGNESIUM: Magnesium: 1.4 mg/dL — ABNORMAL LOW (ref 1.7–2.4)

## 2022-11-15 LAB — HEMOGLOBIN AND HEMATOCRIT, BLOOD
HCT: 23.8 % — ABNORMAL LOW (ref 36.0–46.0)
Hemoglobin: 7.9 g/dL — ABNORMAL LOW (ref 12.0–15.0)

## 2022-11-15 MED ORDER — MAGNESIUM OXIDE -MG SUPPLEMENT 400 (240 MG) MG PO TABS: 400.0000 mg | ORAL_TABLET | Freq: Every day | ORAL | Status: DC

## 2022-11-15 MED ORDER — ALTEPLASE 2 MG IJ SOLR
2.0000 mg | Freq: Once | INTRAMUSCULAR | Status: AC
  Administered 2022-11-15: 2 mg
  Filled 2022-11-15: qty 2

## 2022-11-15 MED ORDER — MAGNESIUM OXIDE -MG SUPPLEMENT 400 (240 MG) MG PO TABS
400.0000 mg | ORAL_TABLET | Freq: Once | ORAL | Status: AC
  Administered 2022-11-15: 400 mg via ORAL
  Filled 2022-11-15: qty 1

## 2022-11-15 MED ORDER — HEPARIN SOD (PORK) LOCK FLUSH 100 UNIT/ML IV SOLN
500.0000 [IU] | INTRAVENOUS | Status: AC | PRN
  Administered 2022-11-15: 500 [IU]

## 2022-11-15 MED ORDER — STERILE WATER FOR INJECTION IJ SOLN
INTRAMUSCULAR | Status: AC
  Filled 2022-11-15: qty 10

## 2022-11-15 MED ORDER — ORAL CARE MOUTH RINSE: 15.0000 mL | OROMUCOSAL | Status: DC | PRN

## 2022-11-15 MED ORDER — MAGNESIUM OXIDE -MG SUPPLEMENT 400 (240 MG) MG PO TABS: 400.0000 mg | ORAL_TABLET | Freq: Every day | ORAL | 0 refills | Status: DC

## 2022-11-15 NOTE — Plan of Care (Signed)
  Problem: Education: Goal: Understanding of CV disease, CV risk reduction, and recovery process will improve Outcome: Adequate for Discharge Goal: Individualized Educational Video(s) Outcome: Adequate for Discharge   Problem: Activity: Goal: Ability to return to baseline activity level will improve Outcome: Adequate for Discharge   Problem: Cardiovascular: Goal: Ability to achieve and maintain adequate cardiovascular perfusion will improve 11/15/2022 1815 by Lise Auer, LPN Outcome: Adequate for Discharge 11/15/2022 1050 by Iver Nestle A, LPN Outcome: Progressing Goal: Vascular access site(s) Level 0-1 will be maintained Outcome: Adequate for Discharge   Problem: Health Behavior/Discharge Planning: Goal: Ability to safely manage health-related needs after discharge will improve Outcome: Adequate for Discharge   Problem: Education: Goal: Knowledge of the prescribed therapeutic regimen will improve 11/15/2022 1815 by Lise Auer, LPN Outcome: Adequate for Discharge 11/15/2022 1050 by Iver Nestle A, LPN Outcome: Progressing Goal: Understanding of sexual limitations or changes related to disease process or condition will improve Outcome: Adequate for Discharge Goal: Individualized Educational Video(s) Outcome: Adequate for Discharge   Problem: Self-Concept: Goal: Communication of feelings regarding changes in body function or appearance will improve Outcome: Adequate for Discharge   Problem: Skin Integrity: Goal: Demonstration of wound healing without infection will improve Outcome: Adequate for Discharge   Problem: Education: Goal: Knowledge of General Education information will improve Description: Including pain rating scale, medication(s)/side effects and non-pharmacologic comfort measures Outcome: Adequate for Discharge   Problem: Health Behavior/Discharge Planning: Goal: Ability to manage health-related needs will improve Outcome: Adequate for  Discharge   Problem: Clinical Measurements: Goal: Ability to maintain clinical measurements within normal limits will improve Outcome: Adequate for Discharge Goal: Will remain free from infection Outcome: Adequate for Discharge Goal: Diagnostic test results will improve Outcome: Adequate for Discharge Goal: Respiratory complications will improve Outcome: Adequate for Discharge Goal: Cardiovascular complication will be avoided Outcome: Adequate for Discharge   Problem: Activity: Goal: Risk for activity intolerance will decrease Outcome: Adequate for Discharge   Problem: Nutrition: Goal: Adequate nutrition will be maintained Outcome: Adequate for Discharge   Problem: Coping: Goal: Level of anxiety will decrease Outcome: Adequate for Discharge   Problem: Elimination: Goal: Will not experience complications related to bowel motility 11/15/2022 1815 by Lise Auer, LPN Outcome: Adequate for Discharge 11/15/2022 1050 by Iver Nestle A, LPN Outcome: Progressing Goal: Will not experience complications related to urinary retention Outcome: Adequate for Discharge   Problem: Pain Managment: Goal: General experience of comfort will improve Outcome: Adequate for Discharge   Problem: Safety: Goal: Ability to remain free from injury will improve Outcome: Adequate for Discharge   Problem: Skin Integrity: Goal: Risk for impaired skin integrity will decrease Outcome: Adequate for Discharge

## 2022-11-15 NOTE — Progress Notes (Signed)
4 Days Post-Op Procedure(s) (LRB): OPEN OMENTECTOMY, PERITONEAL STRIPPING,  LYSIS OF ADHESIONS (N/A) TUMOR DEBULKING (N/A)  Subjective: Patient reports doing well this am. Tolerating diet with no nausea or emesis. Started using tramadol for pain and reports pain relieved with this. Ambulating without difficulty. Voiding without difficulty. Had loose BM yesterday and has been passing flatus. Denies chest pain, dyspnea. No concerns voiced.  Objective: Vital signs in last 24 hours: Temp:  [98.1 F (36.7 C)-99.2 F (37.3 C)] 98.1 F (36.7 C) (01/22 0519) Pulse Rate:  [82-110] 96 (01/22 0519) Resp:  [16-18] 16 (01/22 0519) BP: (85-124)/(51-74) 99/60 (01/22 0519) SpO2:  [94 %-98 %] 95 % (01/22 0519) Last BM Date : 11/10/22  Intake/Output from previous day: 01/21 0701 - 01/22 0700 In: 240 [P.O.:240] Out: 1900 [Urine:1700; Emesis/NG output:200]  Physical Examination: Gen: alert, cooperative, and no distress HEENT: normocephalic, atraumatic Pulm: lungs clear to auscultation bilaterally, no wheezes CV: Regular rate and rhythm, no murmur, click, rub or gallop Abd: soft, mildly distended, minimal asymmetry along mid to inferior aspect of the incision (fullness along right side), good bowel sounds improved from yesterday. Incision is clean, dry and intact with honeycomb dressing in place. Extremities: normal range of motion, no calf tenderness, no edema, SCDs on  Labs:    Latest Ref Rng & Units 11/14/2022    4:40 AM 11/13/2022    4:35 AM 11/12/2022    7:36 PM  CBC  WBC 4.0 - 10.5 K/uL 14.7  15.3    Hemoglobin 12.0 - 15.0 g/dL 8.8  8.4  8.1   Hematocrit 36.0 - 46.0 % 27.0  24.6  25.1   Platelets 150 - 400 K/uL 451  370        Latest Ref Rng & Units 11/14/2022    4:40 AM 11/13/2022    4:35 AM 11/12/2022    3:30 AM  BMP  Glucose 70 - 99 mg/dL 111  95  179   BUN 6 - 20 mg/dL '11  13  17   '$ Creatinine 0.44 - 1.00 mg/dL 0.99  0.86  0.79   Sodium 135 - 145 mmol/L 138  138  136   Potassium  3.5 - 5.1 mmol/L 4.2  4.7  4.3   Chloride 98 - 111 mmol/L 102  106  104   CO2 22 - 32 mmol/L '26  26  22   '$ Calcium 8.9 - 10.3 mg/dL 8.8  8.7  8.3    Assessment: 57 y.o. s/p Procedure(s): OPEN OMENTECTOMY, PERITONEAL STRIPPING,  LYSIS OF ADHESIONS TUMOR DEBULKING: doing well.   Post-op: doing well. Meeting most milestones. Now endorsing return of bowel function. Increasing mobility and tolerating diet.   Pain: well controlled on oral regimen.    Heme: chronic anemia in the setting of malignancy and recent chemotherapy. No significant change postop. Continues to be asymptomatic. Will monitor. Excellent urine output. Awaiting am labs.   Leukocytosis: Awaiting results of am labs. Decreased from 1/20 even with other lab parameters that indicate hemoconcentration compared to labs yesterday. Asymptomatic. Afebrile. In the setting of recent surgery and steroid administration, suspect reactive. Will continue to monitor.   Hypothyroidism: continue home levothyroxine.   Prophylaxis: lovenox, SCDs.   Plan: Diet as tolerated Awaiting IV team to draw am labs  Dispo:  Discharge plan to include : none The patient is to be discharged to home. Likely discharge later today.   LOS: 4 days    Lauren Chandler 11/15/2022, 8:50 AM

## 2022-11-15 NOTE — Progress Notes (Signed)
IV team unable to get blood from port for labs. Will order medication to resolve issue. Phlebotomy notified to come obtain labs.

## 2022-11-15 NOTE — Progress Notes (Signed)
Mobility Specialist - Progress Note   11/15/22 1035  Mobility  Activity Ambulated with assistance in hallway;Ambulated with assistance to bathroom  Level of Assistance Standby assist, set-up cues, supervision of patient - no hands on  Assistive Device Front wheel walker  Distance Ambulated (ft) 200 ft  Activity Response Tolerated well  Mobility Referral Yes  $Mobility charge 1 Mobility   Pt received in bed and agreeable to mobility. Pt requested assistance to bathroom prior to ambulating. C/o abdomen pain at EOS. Pt to bed after session with all needs met & husband in room.  [facility]

## 2022-11-15 NOTE — Progress Notes (Signed)
Pt BP 86/56 manual. Scheduled metoprolol not given. Pt getting IV fluids. Denies lightheadedness or dizziness and is resting without other issues.

## 2022-11-15 NOTE — Plan of Care (Signed)
  Problem: Cardiovascular: Goal: Ability to achieve and maintain adequate cardiovascular perfusion will improve Outcome: Progressing   Problem: Education: Goal: Knowledge of the prescribed therapeutic regimen will improve Outcome: Progressing   Problem: Elimination: Goal: Will not experience complications related to bowel motility Outcome: Progressing

## 2022-11-15 NOTE — Progress Notes (Signed)
Mobility Specialist - Progress Note   11/15/22 1408  Mobility  Activity Ambulated with assistance in hallway;Ambulated with assistance to bathroom  Level of Assistance Standby assist, set-up cues, supervision of patient - no hands on  Assistive Device Front wheel walker  Distance Ambulated (ft) 200 ft  Activity Response Tolerated well  Mobility Referral Yes  $Mobility charge 1 Mobility   Pt received in bed and agreeable to mobility. Prior to ambulating, pt requested assistance to bathroom. No complaints during session. Pt to bed after session with all needs met & husband in room.  [facility]

## 2022-11-15 NOTE — Progress Notes (Signed)
Pt had urgent labs placed 0627. Pt's implanted port had no blood return so labs were unable to be collected until port received maintenance for return. Pt later agreed to a peripheral lab draw to expedite process. Contacted phlebotomy to inform, when phlebotomy arrived, patient refused peripheral lab draw.

## 2022-11-15 NOTE — Plan of Care (Signed)
  Problem: Education: Goal: Understanding of CV disease, CV risk reduction, and recovery process will improve Outcome: Progressing Goal: Individualized Educational Video(s) Outcome: Progressing   Problem: Activity: Goal: Ability to return to baseline activity level will improve Outcome: Progressing   Problem: Cardiovascular: Goal: Ability to achieve and maintain adequate cardiovascular perfusion will improve Outcome: Progressing

## 2022-11-15 NOTE — Discharge Summary (Signed)
Physician Discharge Summary  Patient ID: Lauren Chandler MRN: [id number] DOB/AGE: 04/01/1966 57 y.o.  Admit date: 11/11/2022 Discharge date: 11/15/2022  Admission Diagnoses: Gynecologic malignancy [facility])  Discharge Diagnoses:  Principal Problem:   Gynecologic malignancy [facility]) Active Problems:   Peritoneal carcinoma ([place])   Nausea with vomiting   Discharged Condition:  The patient is in good condition and stable for discharge.   Hospital Course: On 11/11/2022, the patient underwent the following: Procedure(s): OPEN OMENTECTOMY, PERITONEAL STRIPPING,  LYSIS OF ADHESIONS TUMOR DEBULKING. The postoperative course was uneventful.  She was discharged to home on postoperative day 4 tolerating a regular diet, passing flatus, pain controlled with oral medications, ambulating, voiding, having bowel movements. Plan for repeat lab work on Thursday of this week.   Consults: None  Significant Diagnostic Studies: Labs  Treatments: Surgery: see above  Discharge Exam: Blood pressure 99/60, pulse 96, temperature 98.1 F (36.7 C), resp. rate 16, height '5\' 3"'$  (1.6 m), weight 158 lb 9.6 oz (71.9 kg), SpO2 95 %. Gen: alert, cooperative, and no distress HEENT: normocephalic, atraumatic Pulm: lungs clear to auscultation bilaterally, no wheezes CV: Regular rate and rhythm, no murmur, click, rub or gallop Abd: soft, mildly distended, minimal asymmetry along mid to inferior aspect of the incision (fullness along right side) has improved compared with yesterday assessment, good bowel sounds improved from yesterday. Incision is clean, dry and intact with honeycomb dressing in place. Extremities: normal range of motion, no calf tenderness, no edema, SCDs on  Disposition: Discharge disposition: 01-Home or Self Care       Discharge Instructions     Call MD for:  difficulty breathing, headache or visual disturbances   Complete by: As directed    Call MD for:  extreme fatigue   Complete by: As directed     Call MD for:  hives   Complete by: As directed    Call MD for:  persistant dizziness or light-headedness   Complete by: As directed    Call MD for:  persistant nausea and vomiting   Complete by: As directed    Call MD for:  redness, tenderness, or signs of infection (pain, swelling, redness, odor or green/yellow discharge around incision site)   Complete by: As directed    Call MD for:  severe uncontrolled pain   Complete by: As directed    Call MD for:  temperature >100.4   Complete by: As directed    Diet - low sodium heart healthy   Complete by: As directed    Driving Restrictions   Complete by: As directed    No driving for around 3-50 days.  Do not take narcotics and drive. You need to make sure your reaction time has returned.   Increase activity slowly   Complete by: As directed    Lifting restrictions   Complete by: As directed    No lifting greater than 10 lbs, pushing, pulling, straining for 6 weeks.   Sexual Activity Restrictions   Complete by: As directed    No sexual activity, nothing in the vagina, for 4 weeks.      Allergies as of 11/15/2022   No Known Allergies      Medication List     STOP taking these medications    aspirin EC 81 MG tablet   bisacodyl 5 MG EC tablet Commonly known as: DULCOLAX   cefdinir 300 MG capsule Commonly known as: OMNICEF   erythromycin base 500 MG tablet Commonly known as: E-MYCIN   neomycin  500 MG tablet Commonly known as: MYCIFRADIN   senna 8.6 MG tablet Commonly known as: SENOKOT       TAKE these medications    acetaminophen 500 MG tablet Commonly known as: TYLENOL Take 1,000 mg by mouth every 6 (six) hours as needed for moderate pain.   Dialyvite Vitamin D 5000 125 MCG (5000 UT) capsule Generic drug: Cholecalciferol Take 5,000 Units by mouth every evening.   levothyroxine 125 MCG tablet Commonly known as: SYNTHROID Take 125 mcg by mouth daily before breakfast.   lidocaine-prilocaine  cream Commonly known as: EMLA Apply to affected area once   losartan 25 MG tablet Commonly known as: COZAAR Take 0.5 tablets (12.5 mg total) by mouth at bedtime.   metoprolol succinate 25 MG 24 hr tablet Commonly known as: Toprol XL Take 0.5 tablets (12.5 mg total) by mouth at bedtime.   pantoprazole 40 MG tablet Commonly known as: PROTONIX Take 40 mg by mouth daily before breakfast.   rivaroxaban 10 MG Tabs tablet Commonly known as: XARELTO Take 1 tablet (10 mg total) by mouth daily. For AFTER surgery, take once a day at the same time each day, start the day after leaving the hospital   rosuvastatin 5 MG tablet Commonly known as: CRESTOR Take 1 tablet (5 mg total) by mouth 3 (three) times a week. What changed:  when to take this additional instructions   senna-docusate 8.6-50 MG tablet Commonly known as: Senokot-S Take 2 tablets by mouth at bedtime. For AFTER surgery, do not take if having diarrhea   traMADol 50 MG tablet Commonly known as: ULTRAM Take 1 tablet (50 mg total) by mouth every 6 (six) hours as needed for severe pain. For AFTER surgery, do not take and drive   vitamin L-89 500 MCG tablet Commonly known as: CYANOCOBALAMIN Take 500 mcg by mouth every evening.        Follow-up Information     Lafonda Mosses, MD Follow up on 12/02/2022.   Specialty: Gynecologic Oncology Why: at 3:15pm at the [facility] for post-op check Contact information: [place] [id number] [phone]         [facility] Follow up on 11/18/2022.   Specialty: Oncology Why: at 1:30pm Contact information: [place] [id number] [place] [phone]                Greater than thirty minutes were spend for face to face discharge instructions and discharge orders/summary in EPIC.   Signed: Dorothyann Gibbs 11/15/2022, 5:30 PM

## 2022-11-16 ENCOUNTER — Telehealth: Payer: Self-pay | Admitting: Oncology

## 2022-11-16 ENCOUNTER — Telehealth: Payer: Self-pay | Admitting: *Deleted

## 2022-11-16 ENCOUNTER — Other Ambulatory Visit: Payer: Self-pay | Admitting: Hematology and Oncology

## 2022-11-16 NOTE — Telephone Encounter (Signed)
Spoke with Lauren Chandler this morning. She states she is eating, drinking and urinating well. She has not had a BM since discharge home but is passing gas. Reports one small MB in hospital. She is not taking senokot and this was discussed with her. Encouraged her to try Senokot or Miralax with drink plenty of water. She denies fever or chills. Dressing dry and intact and instructed this should be removed today. She rates her pain 5-8/10. Pain is at 5 when resting but increases to 7-8 when mobile. Her pain is controlled alternating Tylenol and Tramadol. Patient reports pain levels are acceptable. .   Per Joylene John, NP, advised to continue the mag oxide until notified of lab results and any changes.   Advised of response from Dr Alvy Bimler regarding vaccines. Dr. Alvy Bimler said she recommends getting one at time one month apart from each other. She says to start with the flu shot first if she has not already had this. She has not had the flu vaccine yet so will start with this.   Instructed to call office with any fever, chills, purulent drainage, uncontrolled pain or any other questions or concerns. Patient verbalizes understanding.   Pt aware of post op appointments as well as the office number [phone] and after hours number [phone] to call if she has any questions or concerns

## 2022-11-16 NOTE — Telephone Encounter (Signed)
Called Lauren Chandler about resuming chemotherapy after surgery.  Discussed that it will be scheduled 4 weeks after surgery, around the week of 12/06/22.  She prefers to have it scheduled on a Monday or Tuesday.  Advised her that a scheduler will call her with the appointments.

## 2022-11-17 ENCOUNTER — Other Ambulatory Visit: Payer: Self-pay | Admitting: Gynecologic Oncology

## 2022-11-17 DIAGNOSIS — C579 Malignant neoplasm of female genital organ, unspecified: Secondary | ICD-10-CM

## 2022-11-17 DIAGNOSIS — D6481 Anemia due to antineoplastic chemotherapy: Secondary | ICD-10-CM

## 2022-11-18 ENCOUNTER — Encounter: Payer: Self-pay | Admitting: Hematology and Oncology

## 2022-11-18 ENCOUNTER — Other Ambulatory Visit: Payer: Self-pay

## 2022-11-18 ENCOUNTER — Inpatient Hospital Stay: Payer: BC Managed Care – PPO

## 2022-11-18 ENCOUNTER — Telehealth: Payer: Self-pay | Admitting: Surgery

## 2022-11-18 DIAGNOSIS — T451X5A Adverse effect of antineoplastic and immunosuppressive drugs, initial encounter: Secondary | ICD-10-CM | POA: Diagnosis not present

## 2022-11-18 DIAGNOSIS — D6481 Anemia due to antineoplastic chemotherapy: Secondary | ICD-10-CM

## 2022-11-18 DIAGNOSIS — C482 Malignant neoplasm of peritoneum, unspecified: Secondary | ICD-10-CM | POA: Diagnosis not present

## 2022-11-18 DIAGNOSIS — Z79899 Other long term (current) drug therapy: Secondary | ICD-10-CM | POA: Diagnosis not present

## 2022-11-18 LAB — CBC (CANCER CENTER ONLY)
HCT: 26.6 % — ABNORMAL LOW (ref 36.0–46.0)
Hemoglobin: 8.9 g/dL — ABNORMAL LOW (ref 12.0–15.0)
MCH: 32.7 pg (ref 26.0–34.0)
MCHC: 33.5 g/dL (ref 30.0–36.0)
MCV: 97.8 fL (ref 80.0–100.0)
Platelet Count: 475 10*3/uL — ABNORMAL HIGH (ref 150–400)
RBC: 2.72 MIL/uL — ABNORMAL LOW (ref 3.87–5.11)
RDW: 25.2 % — ABNORMAL HIGH (ref 11.5–15.5)
WBC Count: 9.3 10*3/uL (ref 4.0–10.5)
nRBC: 0.6 % — ABNORMAL HIGH (ref 0.0–0.2)

## 2022-11-18 LAB — BASIC METABOLIC PANEL
Anion gap: 7 (ref 5–15)
BUN: 19 mg/dL (ref 6–20)
CO2: 31 mmol/L (ref 22–32)
Calcium: 9.9 mg/dL (ref 8.9–10.3)
Chloride: 97 mmol/L — ABNORMAL LOW (ref 98–111)
Creatinine, Ser: 1.07 mg/dL — ABNORMAL HIGH (ref 0.44–1.00)
GFR, Estimated: >60 mL/min (ref >60)
Glucose, Bld: 100 mg/dL — ABNORMAL HIGH (ref 70–99)
Potassium: 4.4 mmol/L (ref 3.5–5.1)
Sodium: 135 mmol/L (ref 135–145)

## 2022-11-18 LAB — MAGNESIUM: Magnesium: 1.4 mg/dL — ABNORMAL LOW (ref 1.7–2.4)

## 2022-11-18 LAB — PHOSPHORUS: Phosphorus: 4 mg/dL (ref 2.5–4.6)

## 2022-11-18 NOTE — Telephone Encounter (Signed)
Per Joylene John, APP, called patient to review labs done today. Patient Magnesium level is low, patient states she is taking her Magnesium pills but missed her dose on Tuesday. Advised patient on importance of taking her medication daily. Kidney function also slightly elevated. Patient states she does drink water but could increase the amount. Advised patient on importance of drinking enough fluids to avoid dehydration, especially since she takes Xarelto. Patient verbalized understanding and had no further questions at this time.

## 2022-11-22 ENCOUNTER — Telehealth: Payer: Self-pay | Admitting: Hematology and Oncology

## 2022-11-22 NOTE — Telephone Encounter (Signed)
Scheduled appointment per WQ. Patient is aware of the made appointments. 

## 2022-11-23 ENCOUNTER — Other Ambulatory Visit: Payer: Self-pay

## 2022-11-25 ENCOUNTER — Encounter: Payer: Self-pay | Admitting: Hematology and Oncology

## 2022-11-30 NOTE — Progress Notes (Signed)
Cardiology Office Note:    Date:  12/06/2022   ID:  Lauren Chandler, DOB 11-29-1965, MRN [id number]  PCP:  Michael Boston, MD   [facility] Cardiologist:  Lenna Sciara, MD Referring MD: Michael Boston, MD   Chief Complaint/Reason for Referral: General cardiology follow up  ASSESSMENT:    1. Coronary artery calcification seen on CAT scan   2. Aortic atherosclerosis (HCC)   3. Elevated Lp(a)   4. 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 NICM (nonischemic cardiomyopathy) ([place])       PLAN:    In order of problems listed above: 1.  Coronary artery calcification: Continue aspirin; intolerant of multiple statins.  See #3 below. 2.  Aortic atherosclerosis: See #1 above. 3.  Hyperlipidemia: Check lipid panel and LFTs today  4.  Primary peritoneal adenocarcinoma: She is undergoing chemotherapy.  5.  Cardiomyopathy: Continue current medications.  After discussion with patient we elected to decide about the addition of Jardiance after her echocardiogram in April.             Dispo:  Return in about 6 months (around 06/06/2023).      Medication Adjustments/Labs and Tests Ordered: Current medicines are reviewed at length with the patient today.  Concerns regarding medicines are outlined above.  The following changes have been made:     Labs/tests ordered: Orders Placed This Encounter  Procedures   Lipid panel   Hepatic function panel   ECHOCARDIOGRAM COMPLETE    Medication Changes: No orders of the defined types were placed in this encounter.    Current medicines are reviewed at length with the patient today.  The patient does not have concerns regarding medicines.   History of Present Illness:    FOCUSED PROBLEM LIST:   1.  Coronary artery calcification and aortic atherosclerosis CT scan 2023 2.  Hyperlipidemia; intolerant of atorvastatin and rosuvastatin; elevated LP(a) 3.  Hodgkin's disease status post XRT 4.  Right breast cancer status post  mastectomy and chemotherapy 5.  GERD 6.  BMI of 30 7.  Hypothyroidism 8.  Primary peritoneal adenocarcinoma diagnosed 2023; being treated with carboplatin and Taxol 9.  Cardiomyopathy ejection fraction 40 to 45%  October 2023 consultation: The patient is a 57 y.o. female with the indicated medical history here for recommendations regarding elevated calcium score.  Patient had a screening calcium score done in August elevated calcium aortic atherosclerosis.  She was started on rosuvastatin 10 mg.  She is here for further recommendations.  On review of her record it seems that she was diagnosed with peritoneal adenocarcinoma started on chemotherapy recently.  She developed ascites and underwent paracentesis recently.  This is helped her orthopnea and dyspnea somewhat but she is still complaining of this though it very low level.  She denies any exertional angina, presyncope or syncope.  She has had no bleeding or bruising, unilateral lower extremity edema, black or bloody stools, or signs or symptoms of stroke.  She does not smoke.  She is recently retired from being a Pharmacist, hospital.  Plan: Obtain echocardiogram, start aspirin 81 mg, and refer to pharmacy given intolerance to atorvastatin and rosuvastatin.  November 2023: In the interim the patient had an echocardiogram which showed ejection fraction 40 to 45% with hypokinesis.  She is here to discuss those results.  She is responding to therapy our plans for a few more cycles before potential debulking surgery.  Her breathing is stable.  She denies any exertional angina.  She denies any  presyncope or syncope.  She has had no peripheral edema.  She did require a paracentesis October and the fluid has not not reaccumulated.  Overall she is doing fairly well.  Plan: Start Toprol XL 12.5 mg, losartan 12.5 mg, and refer for coronary angiography and right heart catheterization.  Check lipid panel and LP(a).  Today: In the interim the patient had coronary angiography  and right heart catheterization that were reassuring and showed fairly normal right heart filling pressures.  Her LDL was found to be 282 with LP(a) that was highly elevated.  She was seen by pharmacy and rosuvastatin 5 mg 3 times a week was trialed.  She underwent omentectomy and peritoneal stripping last month which was uncomplicated.  She did have an episode of lightheadedness after she came back home from the hospital.  This has not recurred.  She denies any shortness of breath or chest pain.  She denies any peripheral edema.  She is to undergo 2 more chemotherapy treatments the last being in early March.        Current Medications: Current Meds  Medication Sig   Cholecalciferol (DIALYVITE VITAMIN D 5000) 125 MCG (5000 UT) capsule Take 5,000 Units by mouth every evening.   levothyroxine (SYNTHROID) 125 MCG tablet Take 125 mcg by mouth daily before breakfast.   lidocaine-prilocaine (EMLA) cream Apply to affected area once   losartan (COZAAR) 25 MG tablet Take 0.5 tablets (12.5 mg total) by mouth at bedtime.   magnesium oxide (MAG-OX) 400 (240 Mg) MG tablet Take 1 tablet (400 mg total) by mouth daily.   metoprolol succinate (TOPROL XL) 25 MG 24 hr tablet Take 0.5 tablets (12.5 mg total) by mouth at bedtime.   pantoprazole (PROTONIX) 40 MG tablet Take 40 mg by mouth daily before breakfast.   rivaroxaban (XARELTO) 10 MG TABS tablet Take 1 tablet (10 mg total) by mouth daily. For AFTER surgery, take once a day at the same time each day, start the day after leaving the hospital   rosuvastatin (CRESTOR) 5 MG tablet Take 1 tablet (5 mg total) by mouth 3 (three) times a week. (Patient taking differently: Take 5 mg by mouth every Monday, Wednesday, and Friday at 8 PM. In the evening.)   senna-docusate (SENOKOT-S) 8.6-50 MG tablet Take 2 tablets by mouth at bedtime. For AFTER surgery, do not take if having diarrhea   vitamin B-12 (CYANOCOBALAMIN) 500 MCG tablet Take 500 mcg by mouth every evening.      Allergies:    Patient has no known allergies.   Social History:   Social History   Tobacco Use   Smoking status: Never   Smokeless tobacco: Never  Vaping Use   Vaping Use: Never used  Substance Use Topics   Alcohol use: Yes    Comment: rare   Drug use: Never     Family Hx: Family History  Problem Relation Age of Onset   Prostate cancer Father    Prostate cancer Brother    Colon cancer Maternal Grandmother    Breast cancer Paternal Grandmother    Ovarian cancer Neg Hx    Endometrial cancer Neg Hx    Pancreatic cancer Neg Hx      Review of Systems:   Please see the history of present illness.    All other systems reviewed and are negative.     EKGs/Labs/Other Test Reviewed:    EKG:  EKG performed today that I personally reviewed demonstrates sinus tachycardia  Prior CV studies:  Coronary angiography/RHC  2024: 1.  Very mild obstructive coronary artery disease of the right coronary artery and left anterior descending arteries. 2.  Relatively normal filling pressures with a mean RA pressure of 4 mmHg, mean wedge pressure of 12 mmHg, and LVEDP of 14 mmHg with a cardiac output by Fick of 6.2 L/min and cardiac index by Fick of 3.5 L/min/m.  TTE 2023:  1. Left ventricular ejection fraction, by estimation, is 40 to 45%. The  left ventricle has mildly decreased function. The left ventricle  demonstrates global hypokinesis. Left ventricular diastolic parameters are  indeterminate.   2. Right ventricular systolic function is normal. The right ventricular  size is normal.   3. The mitral valve is normal in structure. Mild mitral valve  regurgitation. No evidence of mitral stenosis.   4. The aortic valve is normal in structure. Aortic valve regurgitation is  mild to moderate. Aortic valve sclerosis/calcification is present, without  any evidence of aortic stenosis.   5. The inferior vena cava is normal in size with greater than 50%  respiratory variability, suggesting  right atrial pressure of 3 mmHg.   Calcium score 2023 Left Main: 8.0 LAD: 0 LCx: 40.2 RCA: 1.95 Total Agatston score: 50.2 [place] database percentile: 90 Aortic atherosclerosis. No acute extra cardiac abnormality.  Other studies Reviewed: Review of the additional studies/records demonstrates: CT abdomen pelvis  Recent Labs: 09/20/2022: TSH 1.356 11/01/2022: ALT 17 11/18/2022: Hemoglobin 8.9; Magnesium 1.4; Platelet Count 475 12/02/2022: BUN 23; Creatinine 0.95; Potassium 4.3; Sodium 137   Recent Lipid Panel Lab Results  Component Value Date/Time   CHOL 278 (H) 11/01/2022 09:22 AM   TRIG 201 (H) 11/01/2022 09:22 AM   HDL 45 11/01/2022 09:22 AM   LDLCALC 195 (H) 11/01/2022 09:22 AM    External labs from February 2023 demonstrated creatinine 0.9, normal LFTs, hemoglobin 13.4, total cholesterol 294, triglycerides 180, HDL 49, LDL obtained  Risk Assessment/Calculations:                Physical Exam:    VS:  BP 98/60   Pulse 83   Ht 5' 3"$  (1.6 m)   Wt 157 lb 9.6 oz (71.5 kg)   SpO2 99%   BMI 27.92 kg/m    Wt Readings from Last 3 Encounters:  12/06/22 157 lb 9.6 oz (71.5 kg)  12/02/22 155 lb (70.3 kg)  11/11/22 158 lb 9.6 oz (71.9 kg)    GENERAL:  No apparent distress, AOx3 HEENT:  No carotid bruits, +2 carotid impulses, no scleral icterus; no JVD CAR: Regular but tachycardic no murmurs, gallops, rubs, or thrills; port in right upper chest RES:  Clear to auscultation bilaterally ABD: Firm, mildly distended, positive bowel sounds x 4 VASC:  +2 radial pulses, +2 carotid pulses, palpable pedal pulses NEURO:  CN 2-12 grossly intact; motor and sensory grossly intact PSYCH:  No active depression or anxiety EXT:  No edema, ecchymosis, or cyanosis  Signed, Early Osmond, MD  12/06/2022 11:29 AM    [address], [place], [place]  [address] Phone: [phone]; Fax: [phone]   Note:  This document was prepared using Dragon voice  recognition software and may include unintentional dictation errors.

## 2022-12-02 ENCOUNTER — Other Ambulatory Visit: Payer: Self-pay

## 2022-12-02 ENCOUNTER — Inpatient Hospital Stay: Payer: BC Managed Care – PPO | Attending: Gynecologic Oncology | Admitting: Gynecologic Oncology

## 2022-12-02 ENCOUNTER — Encounter: Payer: Self-pay | Admitting: Gynecologic Oncology

## 2022-12-02 ENCOUNTER — Inpatient Hospital Stay: Payer: BC Managed Care – PPO

## 2022-12-02 ENCOUNTER — Encounter: Payer: Self-pay | Admitting: Hematology and Oncology

## 2022-12-02 VITALS — BP 108/57 | HR 98 | Temp 98.0°F | Resp 16 | Wt 155.0 lb

## 2022-12-02 DIAGNOSIS — Z853 Personal history of malignant neoplasm of breast: Secondary | ICD-10-CM | POA: Insufficient documentation

## 2022-12-02 DIAGNOSIS — Z7901 Long term (current) use of anticoagulants: Secondary | ICD-10-CM | POA: Diagnosis not present

## 2022-12-02 DIAGNOSIS — I700 Atherosclerosis of aorta: Secondary | ICD-10-CM | POA: Diagnosis not present

## 2022-12-02 DIAGNOSIS — R188 Other ascites: Secondary | ICD-10-CM | POA: Insufficient documentation

## 2022-12-02 DIAGNOSIS — C786 Secondary malignant neoplasm of retroperitoneum and peritoneum: Secondary | ICD-10-CM

## 2022-12-02 DIAGNOSIS — Z79899 Other long term (current) drug therapy: Secondary | ICD-10-CM | POA: Diagnosis not present

## 2022-12-02 DIAGNOSIS — Z7189 Other specified counseling: Secondary | ICD-10-CM

## 2022-12-02 DIAGNOSIS — Z90710 Acquired absence of both cervix and uterus: Secondary | ICD-10-CM | POA: Diagnosis not present

## 2022-12-02 DIAGNOSIS — C482 Malignant neoplasm of peritoneum, unspecified: Secondary | ICD-10-CM | POA: Insufficient documentation

## 2022-12-02 DIAGNOSIS — Z1509 Genetic susceptibility to other malignant neoplasm: Secondary | ICD-10-CM

## 2022-12-02 DIAGNOSIS — Z1501 Genetic susceptibility to malignant neoplasm of breast: Secondary | ICD-10-CM

## 2022-12-02 DIAGNOSIS — R197 Diarrhea, unspecified: Secondary | ICD-10-CM | POA: Diagnosis not present

## 2022-12-02 DIAGNOSIS — T451X5A Adverse effect of antineoplastic and immunosuppressive drugs, initial encounter: Secondary | ICD-10-CM | POA: Diagnosis not present

## 2022-12-02 DIAGNOSIS — I429 Cardiomyopathy, unspecified: Secondary | ICD-10-CM | POA: Insufficient documentation

## 2022-12-02 DIAGNOSIS — D61818 Other pancytopenia: Secondary | ICD-10-CM | POA: Insufficient documentation

## 2022-12-02 DIAGNOSIS — R5383 Other fatigue: Secondary | ICD-10-CM | POA: Diagnosis not present

## 2022-12-02 DIAGNOSIS — Z9889 Other specified postprocedural states: Secondary | ICD-10-CM

## 2022-12-02 DIAGNOSIS — Z90722 Acquired absence of ovaries, bilateral: Secondary | ICD-10-CM | POA: Insufficient documentation

## 2022-12-02 DIAGNOSIS — Z8571 Personal history of Hodgkin lymphoma: Secondary | ICD-10-CM | POA: Diagnosis not present

## 2022-12-02 DIAGNOSIS — I2510 Atherosclerotic heart disease of native coronary artery without angina pectoris: Secondary | ICD-10-CM | POA: Insufficient documentation

## 2022-12-02 DIAGNOSIS — C579 Malignant neoplasm of female genital organ, unspecified: Secondary | ICD-10-CM

## 2022-12-02 DIAGNOSIS — D6481 Anemia due to antineoplastic chemotherapy: Secondary | ICD-10-CM | POA: Insufficient documentation

## 2022-12-02 LAB — BASIC METABOLIC PANEL - CANCER CENTER ONLY
Anion gap: 6 (ref 5–15)
BUN: 23 mg/dL — ABNORMAL HIGH (ref 6–20)
CO2: 28 mmol/L (ref 22–32)
Calcium: 9.7 mg/dL (ref 8.9–10.3)
Chloride: 103 mmol/L (ref 98–111)
Creatinine: 0.95 mg/dL (ref 0.44–1.00)
GFR, Estimated: >60 mL/min (ref >60)
Glucose, Bld: 89 mg/dL (ref 70–99)
Potassium: 4.3 mmol/L (ref 3.5–5.1)
Sodium: 137 mmol/L (ref 135–145)

## 2022-12-02 NOTE — Patient Instructions (Signed)
It was good to see you today.  You are healing well from surgery.  Please remember, no heavy lifting for 6 weeks after surgery.  I will see you for follow-up in 4 months.  As always, if you develop any new and concerning symptoms before your next visit, please call to see me sooner.

## 2022-12-02 NOTE — Progress Notes (Signed)
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 Small left pleural effusion  2. 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[place])   07/23/2022 Cancer Staging   Staging form: Ovary, Fallopian Tube, and Primary Peritoneal Carcinoma, AJCC 8th Edition - Clinical stage from 07/23/2022: FIGO Stage IIIC (cT3c, cN0, cM0) - Signed by Heath Lark, MD on 07/23/2022 Stage prefix: Initial diagnosis   07/23/2022 Pathology Results   FINAL MICROSCOPIC DIAGNOSIS:  - Malignant cells present  - See comment   SPECIMEN ADEQUACY:  Satisfactory for evaluation   DIAGNOSTIC COMMENTS:  Immunohistochemical stains show that the tumor cells are positive for ER, PAX8, p16 and p53 (clonal overexpression pattern), consistent with a high-grade serous carcinoma.  Immunostain for WT1 is also diffusely positive in the tumor cells, most consistent with an ovarian primary.  Dr. Arby Barrette reviewed the case and concurs with the above diagnosis.    07/30/2022 Procedure   Successful right chest port placement via the right internal jugular vein. The port is ready for immediate use.   08/02/2022 -  Chemotherapy   Patient is on Treatment Plan : OVARIAN Carboplatin (AUC 6) + Paclitaxel (175) q21d X 6 Cycles     08/03/2022 Procedure   Successful ultrasound-guided paracentesis yielding 4.1 liters of peritoneal fluid.   08/09/2022 Tumor Marker   Patient's tumor was tested for the following markers: CA-125. Results of the tumor marker test revealed 452.   08/12/2022 Echocardiogram    1. Left ventricular ejection fraction, by estimation, is 40 to 45%. The left ventricle has mildly decreased function. The left ventricle demonstrates global hypokinesis. Left ventricular diastolic  parameters are indeterminate.   2. Right ventricular systolic function is normal. The right ventricular size is normal.   3. The mitral valve is normal in structure. Mild mitral valve regurgitation. No evidence of mitral stenosis.   4. The aortic valve is normal in structure. Aortic valve regurgitation is mild to moderate. Aortic valve sclerosis/calcification is present, without any evidence of aortic stenosis.   5. The inferior vena cava is normal in size with greater than 50% respiratory variability, suggesting right atrial pressure of 3 mmHg.    09/23/2022 Tumor Marker   Patient's tumor was tested for the following markers: CA-125. Results of the tumor marker test revealed 41.3.   10/07/2022 Imaging   1. Compared to the outside abdominopelvic CT of 07/19/2022, significant improvement in peritoneal carcinomatosis, without bowel obstruction or other acute complication. 2. Decreased size of upper abdominal nodes, possibly also representing response to therapy. 3. No new or progressive disease and no evidence of thoracic metastasis. 4. Bladder wall thickening and mucosal hyperenhancement are suspicious for cystitis. 5. 4 mm left upper lobe pulmonary nodule is most likely benign/incidental but can be re-evaluated at follow-up. 6. Aortic valvular calcifications. Consider echocardiography to evaluate for valvular dysfunction. 7. Coronary artery atherosclerosis. Aortic Atherosclerosis (ICD10-I70.0).     11/11/2022 Surgery   Exploratory laparotomy, lysis of adhesions for approximately 45 minutes, total omentectomy with radical tumor debulking including resection of perigastric nodule, excision and fulguration of multiple small bowel mesenteric nodules, resection of multiple sigmoid epiploica, fulguration of peritoneal lesions along bilateral pelvic sidewalls, peritoneal stripping of bladder peritoneum   Findings: On bimanual exam, no masses appreciated at the vaginal cuff, no nodularity.  On  intra-abdominal entry, infracolic omentum  retracted and involved by residual tumor implant measuring approximately 10 x 4 cm.  Nodularity palpated almost up to the splenic flexure.  Supracolic omentum involved to within 2 cm of the greater curvature of the stomach.  Additional 2 x 3 cm implant noted lateral and inferior to the gastric pylorus (sitting anterior to the duodenum and just inferior to the gallbladder).  Ascending and transverse colon normal in appearance.  Small bowel run from the ileocecal valve to the ligament of Treitz.  Rare small nodules within the mesentery noted.  These were all either excised or fulgurated.  Right aspect of the liver and diaphragm smooth to palpation, no visible lesions.  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  No ascites.  Within the pelvis, minimal nodularity seen along bilateral pelvic sidewalls near the infundibulopelvic remnants, suspected to be related to prior hysterectomy and BSO.  Several sigmoid epiploica with tumor implants versus treated tumor, all excised fulgurated.  No nodularity within the deep pelvis although some evidence of what appeared to be treated tumor versus tumor rind involving the bladder peritoneum was stripped in this location.  Biopsies taken from the posterior cul-de-sac. R0 resection at the end of surgery.   11/11/2022 Pathology Results   A. SMALL BOWEL MESENTARY NODULES: -  Densely fibrotic soft tissue/scar with abundant foamy histiocytes, negative for malignancy.  B. 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  C. PERIGASTRIC IMPLANTS, EXCISION: -  High-grade serous carcinoma.  D. SIGMOID EPIPLOICA, EXCISION: High-grade serous carcinoma in the background of dense stromal fibrosis/scar,  calcifications and foamy histiocytes.  E. BLADDER PERITONEUM, EXCISION: -  High-grade serous carcinoma.  F. CUL DE SAC, POSTERIOR, BIOPSY: -  Focal high-grade serous carcinoma in the background of predominantly broad and foamy histiocytes.  Note: It is noted that the patient had a diagnosis of high-grade serous carcinoma from a cytology of the ascites in September 2023 and was clinically staged as cT3c.  Patient is now status post neoadjuvant chemotherapy.  It is noted that this is a debulking surgery.  No additional testing is performed to reserve tissue for clinician directed testing.      Interval History: Overall doing well.  Endorses some fatigue.  Has had return of bowel function.  Is using Senokot intermittently.  When she used it last night, it caused diarrhea.  She endorses basically daily bowel movements.  Denies any urinary symptoms.  Has occasional twinges, denies significant abdominal pain.  Past Medical/Surgical History: Past Medical History:  Diagnosis Date   Anemia    BRCA2 gene mutation positive 01/08/2019   Breast cancer ([place]) 2007   Left   GERD (gastroesophageal reflux disease)    Hodgkin's disease ([place]) 10/26/1987   clinical   Hypothyroidism    Malignant tumor of breast ([place])    left   Pneumonia    Thyroid dysfunction    2020, as results on Hodgkin's radiation    Past Surgical History:  Procedure Laterality Date   BREAST RECONSTRUCTION     2009   COLONOSCOPY     2017   DEBULKING N/A 11/11/2022   Procedure: TUMOR DEBULKING;  Surgeon: Lafonda Mosses, MD;  Location: [facility];  Service: Gynecology;  Laterality: N/A;   excisional of bilateral breast     2007   Hysteroscopy w.biopsy     2012  IR IMAGING GUIDED PORT INSERTION  07/29/2022   IR PARACENTESIS  07/23/2022   KNEE ARTHROSCOPY     1985   laparoscopically assisted vaginal hysterectomy w/removal of tubes and/or ovaries  04/23/2019   LYMPH NODE BIOPSY  1989   MASTECTOMY Bilateral 2007    OMENTECTOMY N/A 11/11/2022   Procedure: OPEN OMENTECTOMY, PERITONEAL STRIPPING,  LYSIS OF ADHESIONS;  Surgeon: Lafonda Mosses, MD;  Location: [facility];  Service: Gynecology;  Laterality: N/A;   removal of spleen total     1989   RIGHT/LEFT HEART CATH AND CORONARY ANGIOGRAPHY N/A 10/28/2022   Procedure: RIGHT/LEFT HEART CATH AND CORONARY ANGIOGRAPHY;  Surgeon: Early Osmond, MD;  Location: [facility];  Service: Cardiovascular;  Laterality: N/A;    Family History  Problem Relation Age of Onset   Prostate cancer Father    Prostate cancer Brother    Colon cancer Maternal Grandmother    Breast cancer Paternal Grandmother    Ovarian cancer Neg Hx    Endometrial cancer Neg Hx    Pancreatic cancer Neg Hx     Social History   Socioeconomic History   Marital status: Married    Spouse name: Not on file   Number of children: Not on file   Years of education: Not on file   Highest education level: Not on file  Occupational History   Occupation: retired  Tobacco Use   Smoking status: Never   Smokeless tobacco: Never  Vaping Use   Vaping Use: Never used  Substance and Sexual Activity   Alcohol use: Yes    Comment: rare   Drug use: Never   Sexual activity: Yes  Other Topics Concern   Not on file  Social History Narrative   Not on file   Social Determinants of Health   Financial Resource Strain: Low Risk  (08/02/2022)   Overall Financial Resource Strain (CARDIA)    Difficulty of Paying Living Expenses: Not very hard  Food Insecurity: No Food Insecurity (11/11/2022)   Hunger Vital Sign    Worried About Running Out of Food in the Last Year: Never true    Ran Out of Food in the Last Year: Never true  Transportation Needs: No Transportation Needs (11/11/2022)   PRAPARE - Hydrologist (Medical): No    Lack of Transportation (Non-Medical): No  Physical Activity: Not on file  Stress: Not on file  Social Connections: Not on file    Current  Medications:  Current Outpatient Medications:    Cholecalciferol (DIALYVITE VITAMIN D 5000) 125 MCG (5000 UT) capsule, Take 5,000 Units by mouth every evening., Disp: , Rfl:    levothyroxine (SYNTHROID) 125 MCG tablet, Take 125 mcg by mouth daily before breakfast., Disp: , Rfl:    lidocaine-prilocaine (EMLA) cream, Apply to affected area once, Disp: 30 g, Rfl: 3   losartan (COZAAR) 25 MG tablet, Take 0.5 tablets (12.5 mg total) by mouth at bedtime., Disp: 45 tablet, Rfl: 3   magnesium oxide (MAG-OX) 400 (240 Mg) MG tablet, Take 1 tablet (400 mg total) by mouth daily., Disp: 30 tablet, Rfl: 0   metoprolol succinate (TOPROL XL) 25 MG 24 hr tablet, Take 0.5 tablets (12.5 mg total) by mouth at bedtime., Disp: 45 tablet, Rfl: 3   pantoprazole (PROTONIX) 40 MG tablet, Take 40 mg by mouth daily before breakfast., Disp: , Rfl:    rivaroxaban (XARELTO) 10 MG TABS tablet, Take 1 tablet (10 mg total) by mouth daily. For AFTER  surgery, take once a day at the same time each day, start the day after leaving the hospital, Disp: 28 tablet, Rfl: 0   rosuvastatin (CRESTOR) 5 MG tablet, Take 1 tablet (5 mg total) by mouth 3 (three) times a week. (Patient taking differently: Take 5 mg by mouth every Monday, Wednesday, and Friday at 8 PM. In the evening.), Disp: 30 tablet, Rfl: 3   senna-docusate (SENOKOT-S) 8.6-50 MG tablet, Take 2 tablets by mouth at bedtime. For AFTER surgery, do not take if having diarrhea, Disp: 30 tablet, Rfl: 0   vitamin B-12 (CYANOCOBALAMIN) 500 MCG tablet, Take 500 mcg by mouth every evening., Disp: , Rfl:   Review of Systems: + fatigue, diarrhea Denies appetite changes, fevers, chills, unexplained weight changes. Denies hearing loss, neck lumps or masses, mouth sores, ringing in ears or voice changes. Denies cough or wheezing.  Denies shortness of breath. Denies chest pain or palpitations. Denies leg swelling. Denies abdominal distention, pain, blood in stools, constipation, nausea,  vomiting, or early satiety. Denies pain with intercourse, dysuria, frequency, hematuria or incontinence. Denies hot flashes, pelvic pain, vaginal bleeding or vaginal discharge.   Denies joint pain, back pain or muscle pain/cramps. Denies itching, rash, or wounds. Denies dizziness, headaches, numbness or seizures. Denies swollen lymph nodes or glands, denies easy bruising or bleeding. Denies anxiety, depression, confusion, or decreased concentration.  Physical Exam: BP (!) 108/57 (BP Location: Right Arm, Patient Position: Sitting)   Pulse 98   Temp 98 F (36.7 C) (Oral)   Resp 16   Wt 155 lb (70.3 kg)   SpO2 100%   BMI 27.46 kg/m  General: Alert, oriented, no acute distress. HEENT: Normocephalic, atraumatic, sclera anicteric. Chest: Unlabored breathing on room air. Abdomen: soft, nontender.  Normoactive bowel sounds.  No masses or hepatosplenomegaly appreciated.  Well-healed scar. Extremities: Grossly normal range of motion.  Warm, well perfused.  No edema bilaterally.  Laboratory & Radiologic Studies:    Latest Ref Rng & Units 11/18/2022    1:38 PM 11/15/2022    5:11 PM 11/15/2022    1:27 PM  CBC  WBC 4.0 - 10.5 K/uL 9.3   11.1   Hemoglobin 12.0 - 15.0 g/dL 8.9  7.9  7.5   Hematocrit 36.0 - 46.0 % 26.6  23.8  23.5   Platelets 150 - 400 K/uL 475   400       Latest Ref Rng & Units 11/18/2022    1:38 PM 11/15/2022    1:27 PM 11/14/2022    4:40 AM  BMP  Glucose 70 - 99 mg/dL 100  96  111   BUN 6 - 20 mg/dL '19  11  11   '$ Creatinine 0.44 - 1.00 mg/dL 1.07  0.71  0.99   Sodium 135 - 145 mmol/L 135  139  138   Potassium 3.5 - 5.1 mmol/L 4.4  4.2  4.2   Chloride 98 - 111 mmol/L 97  102  102   CO2 22 - 32 mmol/L '31  28  26   '$ Calcium 8.9 - 10.3 mg/dL 9.9  8.9  8.8    Assessment & Plan: KEVIN MARIO is a 57 y.o. 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  Patient  is doing well from a postoperative standpoint.  Discussed continued expectations and restrictions.  Plan to repeat BMP today.  Discussed findings again at the time of surgery and reviewed pathology with  her.  She was given a copy of her final pathology report.  Plan is to continue with an additional 2-3 cycles of adjuvant therapy.  In the setting of her BRCA mutation, patient is an excellent candidate for maintenance PARP inhibitor.  Patient is scheduled to see Dr. Alvy Bimler on 2/13.  20 minutes of total time was spent for this patient encounter, including preparation, face-to-face counseling with the patient and coordination of care, and documentation of the encounter.  Jeral Pinch, MD  Division of Gynecologic Oncology  Department of Obstetrics and Gynecology  [place] [facility] '

## 2022-12-03 ENCOUNTER — Other Ambulatory Visit: Payer: Self-pay

## 2022-12-06 ENCOUNTER — Ambulatory Visit: Payer: BC Managed Care – PPO | Attending: Internal Medicine | Admitting: Internal Medicine

## 2022-12-06 ENCOUNTER — Encounter: Payer: Self-pay | Admitting: Internal Medicine

## 2022-12-06 VITALS — BP 98/60 | HR 83 | Ht 63.0 in | Wt 157.6 lb

## 2022-12-06 DIAGNOSIS — C482 Malignant neoplasm of peritoneum, unspecified: Secondary | ICD-10-CM

## 2022-12-06 DIAGNOSIS — I700 Atherosclerosis of aorta: Secondary | ICD-10-CM | POA: Diagnosis not present

## 2022-12-06 DIAGNOSIS — I428 Other cardiomyopathies: Secondary | ICD-10-CM

## 2022-12-06 DIAGNOSIS — I2510 Atherosclerotic heart disease of native coronary artery without angina pectoris: Secondary | ICD-10-CM

## 2022-12-06 DIAGNOSIS — E7841 Elevated Lipoprotein(a): Secondary | ICD-10-CM

## 2022-12-06 LAB — HEPATIC FUNCTION PANEL
ALT: 13 IU/L (ref 0–32)
AST: 17 IU/L (ref 0–40)
Albumin: 3.9 g/dL (ref 3.8–4.9)
Alkaline Phosphatase: 80 IU/L (ref 44–121)
Bilirubin Total: 0.2 mg/dL (ref 0.0–1.2)
Bilirubin, Direct: <0.1 mg/dL (ref 0.00–0.40)
Total Protein: 7.2 g/dL (ref 6.0–8.5)

## 2022-12-06 LAB — LIPID PANEL
Chol/HDL Ratio: 5.8 ratio — ABNORMAL HIGH (ref 0.0–4.4)
Cholesterol, Total: 282 mg/dL — ABNORMAL HIGH (ref 100–199)
HDL: 49 mg/dL (ref >39)
LDL Chol Calc (NIH): 205 mg/dL — ABNORMAL HIGH (ref 0–99)
Triglycerides: 151 mg/dL — ABNORMAL HIGH (ref 0–149)
VLDL Cholesterol Cal: 28 mg/dL (ref 5–40)

## 2022-12-06 MED FILL — Fosaprepitant Dimeglumine For IV Infusion 150 MG (Base Eq): INTRAVENOUS | Qty: 5 | Status: AC

## 2022-12-06 MED FILL — Dexamethasone Sodium Phosphate Inj 100 MG/10ML: INTRAMUSCULAR | Qty: 1 | Status: AC

## 2022-12-06 NOTE — Patient Instructions (Signed)
Medication Instructions:  Your physician recommends that you continue on your current medications as directed. Please refer to the Current Medication list given to you today.  *If you need a refill on your cardiac medications before your next appointment, please call your pharmacy*  Lab Work: TODAY: Lipid panel, LFTs If you have labs (blood work) drawn today and your tests are completely normal, you will receive your results only by: [place] (if you have MyChart) OR A paper copy in the mail If you have any lab test that is abnormal or we need to change your treatment, we will call you to review the results.  Testing/Procedures: Your physician has requested that you have an echocardiogram in April 2024. Echocardiography is a painless test that uses sound waves to create images of your heart. It provides your doctor with information about the size and shape of your heart and how well your heart's chambers and valves are working. This procedure takes approximately one hour. There are no restrictions for this procedure. Please do NOT wear cologne, perfume, aftershave, or lotions (deodorant is allowed). Please arrive 15 minutes prior to your appointment time.  Follow-Up: At [facility], you and your health needs are our priority.  As part of our continuing mission to provide you with exceptional heart care, we have created designated Provider Care Teams.  These Care Teams include your primary Cardiologist (physician) and Advanced Practice Providers (APPs -  Physician Assistants and Nurse Practitioners) who all work together to provide you with the care you need, when you need it.  Your next appointment:   6 month(s)  Provider:   Early Osmond, MD

## 2022-12-07 ENCOUNTER — Encounter: Payer: Self-pay | Admitting: General Practice

## 2022-12-07 ENCOUNTER — Inpatient Hospital Stay (HOSPITAL_BASED_OUTPATIENT_CLINIC_OR_DEPARTMENT_OTHER): Payer: BC Managed Care – PPO | Admitting: Hematology and Oncology

## 2022-12-07 ENCOUNTER — Other Ambulatory Visit: Payer: Self-pay

## 2022-12-07 ENCOUNTER — Inpatient Hospital Stay: Payer: BC Managed Care – PPO

## 2022-12-07 ENCOUNTER — Encounter: Payer: Self-pay | Admitting: Hematology and Oncology

## 2022-12-07 VITALS — HR 90

## 2022-12-07 VITALS — BP 98/58 | HR 108 | Temp 97.7°F | Resp 18 | Ht 63.0 in | Wt 157.6 lb

## 2022-12-07 DIAGNOSIS — I952 Hypotension due to drugs: Secondary | ICD-10-CM | POA: Insufficient documentation

## 2022-12-07 DIAGNOSIS — C482 Malignant neoplasm of peritoneum, unspecified: Secondary | ICD-10-CM

## 2022-12-07 DIAGNOSIS — I429 Cardiomyopathy, unspecified: Secondary | ICD-10-CM

## 2022-12-07 DIAGNOSIS — I2510 Atherosclerotic heart disease of native coronary artery without angina pectoris: Secondary | ICD-10-CM | POA: Diagnosis not present

## 2022-12-07 DIAGNOSIS — Z853 Personal history of malignant neoplasm of breast: Secondary | ICD-10-CM | POA: Diagnosis not present

## 2022-12-07 DIAGNOSIS — Z7901 Long term (current) use of anticoagulants: Secondary | ICD-10-CM | POA: Diagnosis not present

## 2022-12-07 DIAGNOSIS — Z90710 Acquired absence of both cervix and uterus: Secondary | ICD-10-CM | POA: Diagnosis not present

## 2022-12-07 DIAGNOSIS — D61818 Other pancytopenia: Secondary | ICD-10-CM | POA: Diagnosis not present

## 2022-12-07 DIAGNOSIS — Z8571 Personal history of Hodgkin lymphoma: Secondary | ICD-10-CM | POA: Diagnosis not present

## 2022-12-07 DIAGNOSIS — R188 Other ascites: Secondary | ICD-10-CM | POA: Diagnosis not present

## 2022-12-07 DIAGNOSIS — R197 Diarrhea, unspecified: Secondary | ICD-10-CM | POA: Diagnosis not present

## 2022-12-07 DIAGNOSIS — T451X5A Adverse effect of antineoplastic and immunosuppressive drugs, initial encounter: Secondary | ICD-10-CM

## 2022-12-07 DIAGNOSIS — R5383 Other fatigue: Secondary | ICD-10-CM | POA: Diagnosis not present

## 2022-12-07 DIAGNOSIS — Z79899 Other long term (current) drug therapy: Secondary | ICD-10-CM | POA: Diagnosis not present

## 2022-12-07 DIAGNOSIS — Z90722 Acquired absence of ovaries, bilateral: Secondary | ICD-10-CM | POA: Diagnosis not present

## 2022-12-07 DIAGNOSIS — D6481 Anemia due to antineoplastic chemotherapy: Secondary | ICD-10-CM | POA: Diagnosis not present

## 2022-12-07 DIAGNOSIS — I700 Atherosclerosis of aorta: Secondary | ICD-10-CM | POA: Diagnosis not present

## 2022-12-07 LAB — CBC WITH DIFFERENTIAL (CANCER CENTER ONLY)
Abs Immature Granulocytes: 0.02 10*3/uL (ref 0.00–0.07)
Basophils Absolute: 0.1 10*3/uL (ref 0.0–0.1)
Basophils Relative: 1 %
Eosinophils Absolute: 0.3 10*3/uL (ref 0.0–0.5)
Eosinophils Relative: 4 %
HCT: 26.8 % — ABNORMAL LOW (ref 36.0–46.0)
Hemoglobin: 9 g/dL — ABNORMAL LOW (ref 12.0–15.0)
Immature Granulocytes: 0 %
Lymphocytes Relative: 39 %
Lymphs Abs: 2.7 10*3/uL (ref 0.7–4.0)
MCH: 34.6 pg — ABNORMAL HIGH (ref 26.0–34.0)
MCHC: 33.6 g/dL (ref 30.0–36.0)
MCV: 103.1 fL — ABNORMAL HIGH (ref 80.0–100.0)
Monocytes Absolute: 0.6 10*3/uL (ref 0.1–1.0)
Monocytes Relative: 8 %
Neutro Abs: 3.3 10*3/uL (ref 1.7–7.7)
Neutrophils Relative %: 48 %
Platelet Count: 401 10*3/uL — ABNORMAL HIGH (ref 150–400)
RBC: 2.6 MIL/uL — ABNORMAL LOW (ref 3.87–5.11)
RDW: 18.8 % — ABNORMAL HIGH (ref 11.5–15.5)
Smear Review: NORMAL
WBC Count: 6.9 10*3/uL (ref 4.0–10.5)
nRBC: 0 % (ref 0.0–0.2)

## 2022-12-07 LAB — CMP (CANCER CENTER ONLY)
ALT: 13 U/L (ref 0–44)
AST: 17 U/L (ref 15–41)
Albumin: 3.8 g/dL (ref 3.5–5.0)
Alkaline Phosphatase: 67 U/L (ref 38–126)
Anion gap: 9 (ref 5–15)
BUN: 24 mg/dL — ABNORMAL HIGH (ref 6–20)
CO2: 27 mmol/L (ref 22–32)
Calcium: 9.3 mg/dL (ref 8.9–10.3)
Chloride: 104 mmol/L (ref 98–111)
Creatinine: 0.89 mg/dL (ref 0.44–1.00)
GFR, Estimated: >60 mL/min (ref >60)
Glucose, Bld: 117 mg/dL — ABNORMAL HIGH (ref 70–99)
Potassium: 3.8 mmol/L (ref 3.5–5.1)
Sodium: 140 mmol/L (ref 135–145)
Total Bilirubin: 0.3 mg/dL (ref 0.3–1.2)
Total Protein: 7.6 g/dL (ref 6.5–8.1)

## 2022-12-07 MED ORDER — SODIUM CHLORIDE 0.9% FLUSH
10.0000 mL | Freq: Once | INTRAVENOUS | Status: AC
  Administered 2022-12-07: 10 mL

## 2022-12-07 MED ORDER — SODIUM CHLORIDE 0.9 % IV SOLN
140.0000 mg/m2 | Freq: Once | INTRAVENOUS | Status: AC
  Administered 2022-12-07: 252 mg via INTRAVENOUS
  Filled 2022-12-07: qty 42

## 2022-12-07 MED ORDER — SODIUM CHLORIDE 0.9 % IV SOLN
10.0000 mg | Freq: Once | INTRAVENOUS | Status: AC
  Administered 2022-12-07: 10 mg via INTRAVENOUS
  Filled 2022-12-07: qty 10

## 2022-12-07 MED ORDER — PALONOSETRON HCL INJECTION 0.25 MG/5ML
0.2500 mg | Freq: Once | INTRAVENOUS | Status: AC
  Administered 2022-12-07: 0.25 mg via INTRAVENOUS
  Filled 2022-12-07: qty 5

## 2022-12-07 MED ORDER — SODIUM CHLORIDE 0.9 % IV SOLN: Freq: Once | INTRAVENOUS | Status: AC

## 2022-12-07 MED ORDER — FAMOTIDINE IN NACL 20-0.9 MG/50ML-% IV SOLN
20.0000 mg | Freq: Once | INTRAVENOUS | Status: AC
  Administered 2022-12-07: 20 mg via INTRAVENOUS
  Filled 2022-12-07: qty 50

## 2022-12-07 MED ORDER — SODIUM CHLORIDE 0.9 % IV SOLN
475.5000 mg | Freq: Once | INTRAVENOUS | Status: AC
  Administered 2022-12-07: 500 mg via INTRAVENOUS
  Filled 2022-12-07: qty 50

## 2022-12-07 MED ORDER — SODIUM CHLORIDE 0.9 % IV SOLN
150.0000 mg | Freq: Once | INTRAVENOUS | Status: AC
  Administered 2022-12-07: 150 mg via INTRAVENOUS
  Filled 2022-12-07: qty 150

## 2022-12-07 MED ORDER — SODIUM CHLORIDE 0.9% FLUSH
10.0000 mL | INTRAVENOUS | Status: DC | PRN
  Administered 2022-12-07: 10 mL

## 2022-12-07 MED ORDER — CETIRIZINE HCL 10 MG/ML IV SOLN
10.0000 mg | Freq: Once | INTRAVENOUS | Status: AC
  Administered 2022-12-07: 10 mg via INTRAVENOUS
  Filled 2022-12-07: qty 1

## 2022-12-07 MED ORDER — HEPARIN SOD (PORK) LOCK FLUSH 100 UNIT/ML IV SOLN
500.0000 [IU] | Freq: Once | INTRAVENOUS | Status: AC | PRN
  Administered 2022-12-07: 500 [IU]

## 2022-12-07 NOTE — Progress Notes (Signed)
[facility] Spiritual Care Note  Visited with Lauren Chandler and [name] in infusion per referral from nursing for pastoral check-in. Introduced Mabie as part of their support team, building rapport and providing reflective listening as they shared and processed some of her treatment story, as well as the delightful news of the birth of their first grandchild "Ruthie" in [place] last week. They report strong support from family, friends, and their church, [employer].  Ensured that couple is aware of ongoing chaplain availability in case need/interest arises in the future.   [place], [place], [facility] Pager [phone] Voicemail [phone]

## 2022-12-07 NOTE — Patient Instructions (Signed)
[facility]  Discharge Instructions: Thank you for choosing [facility] to provide your oncology and hematology care.   If you have a lab appointment with the [facility], please go directly to the [facility] and check in at the registration area.   Wear comfortable clothing and clothing appropriate for easy access to any Portacath or PICC line.   We strive to give you quality time with your provider. You may need to reschedule your appointment if you arrive late (15 or more minutes).  Arriving late affects you and other patients whose appointments are after yours.  Also, if you miss three or more appointments without notifying the office, you may be dismissed from the clinic at the provider's discretion.      For prescription refill requests, have your pharmacy contact our office and allow 72 hours for refills to be completed.    Today you received the following chemotherapy and/or immunotherapy agents Taxol/Carbo.      To help prevent nausea and vomiting after your treatment, we encourage you to take your nausea medication as directed.  BELOW ARE SYMPTOMS THAT SHOULD BE REPORTED IMMEDIATELY: *FEVER GREATER THAN 100.4 F (38 C) OR HIGHER *CHILLS OR SWEATING *NAUSEA AND VOMITING THAT IS NOT CONTROLLED WITH YOUR NAUSEA MEDICATION *UNUSUAL SHORTNESS OF BREATH *UNUSUAL BRUISING OR BLEEDING *URINARY PROBLEMS (pain or burning when urinating, or frequent urination) *BOWEL PROBLEMS (unusual diarrhea, constipation, pain near the anus) TENDERNESS IN MOUTH AND THROAT WITH OR WITHOUT PRESENCE OF ULCERS (sore throat, sores in mouth, or a toothache) UNUSUAL RASH, SWELLING OR PAIN  UNUSUAL VAGINAL DISCHARGE OR ITCHING   Items with * indicate a potential emergency and should be followed up as soon as possible or go to the Emergency Department if any problems should occur.  Please show the CHEMOTHERAPY ALERT CARD or IMMUNOTHERAPY ALERT CARD at  check-in to the Emergency Department and triage nurse.  Should you have questions after your visit or need to cancel or reschedule your appointment, please contact [facility]  Dept: [phone]  and follow the prompts.  Office hours are 8:00 a.m. to 4:30 p.m. Monday - Friday. Please note that voicemails left after 4:00 p.m. may not be returned until the following business day.  We are closed weekends and major holidays. You have access to a nurse at all times for urgent questions. Please call the main number to the clinic Dept: [phone] and follow the prompts.   For any non-urgent questions, you may also contact your provider using MyChart. We now offer e-Visits for anyone 35 and older to request care online for non-urgent symptoms. For details visit [email or web address].   Also download the MyChart app! Go to the app store, search "MyChart", open the app, select [facility], and log in with your MyChart username and password.

## 2022-12-07 NOTE — Assessment & Plan Note (Signed)
She has multifactorial anemia Despite improvement of her blood count, she is symptomatic with hypotension Plan minor dose adjustment of carboplatin I recommend return visit next week to get her labs rechecked If her hemoglobin is less than 8, she will receive 1 unit of blood We discussed importance of aggressive supportive care I will check B12 and iron studies in her next visit

## 2022-12-07 NOTE — Assessment & Plan Note (Signed)
She was prescribed beta-blocker and losartan She is symptomatic from severe hypotension I recommend consideration to space out her antihypertensives If her systolic blood pressure remain low and she is symptomatic, I gave the patient permission to omit losartan but to continue on beta-blocker

## 2022-12-07 NOTE — Assessment & Plan Note (Signed)
Overall, she has excellent response to neoadjuvant chemotherapy Moving forward, we discussed the role of adjuvant treatment for 2 more cycles Due to her severe pancytopenia, I recommend minor dose adjustment of carboplatin She will return her next week to get her blood count checked and to receive transfusion support if needed I plan to repeat imaging study in April

## 2022-12-07 NOTE — Assessment & Plan Note (Signed)
She is symptomatic We discussed increasing oral fluid intake as well as transfusion support next week If she does not need blood next week, I will provide IV fluid support next week

## 2022-12-07 NOTE — Progress Notes (Signed)
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[place]) Overall, she has excellent response to neoadjuvant chemotherapy Moving forward, we discussed the role of adjuvant treatment for 2 more cycles Due to her severe pancytopenia, I recommend minor dose adjustment of carboplatin She will return her next week to get her blood count checked and to receive transfusion support if needed I plan to repeat imaging study in April  Anemia due to antineoplastic chemotherapy She has multifactorial anemia Despite improvement of her blood count, she is symptomatic with hypotension Plan minor dose adjustment of carboplatin I recommend return visit next week to get her labs rechecked If her hemoglobin is less than 8, she will receive 1 unit of blood We discussed importance of aggressive supportive care I will check B12 and iron studies in her next visit  Cardiomyopathy ([place]) She was prescribed beta-blocker and losartan She is symptomatic from severe hypotension I recommend consideration to space out her antihypertensives If her systolic blood pressure remain low and she is symptomatic, I gave the patient permission to omit losartan but to continue on beta-blocker  Drug-induced hypotension She is symptomatic We discussed increasing oral fluid intake as well as transfusion support next week If she does not need blood next week, I will provide IV fluid support next week  Orders Placed This Encounter  Procedures   CBC with Differential ([facility] Only)    Standing Status:   Future    Standing Expiration Date:   12/08/2023   Iron and Iron Binding Capacity ([facility] only)    Standing Status:   Future    Standing Expiration Date:   12/08/2023   Ferritin     Standing Status:   Future    Standing Expiration Date:   12/08/2023   Vitamin B12    Standing Status:   Future    Standing Expiration Date:   12/08/2023   Sample to Blood Bank    Standing Status:   Future    Standing Expiration Date:   12/08/2023    All questions were answered. The patient knows to call the clinic with any problems, questions or concerns. The total time spent in the appointment was 40 minutes encounter with patients including review of chart and various tests results, discussions about plan of care and coordination of care plan   Heath Lark, MD 12/07/2022 3:25 PM  INTERVAL HISTORY: Please see below for problem oriented charting. she returns for treatment follow-up to resume chemotherapy She is symptomatic from hypotension She is prescribed medicine due to recent cardiomyopathy and she tolerated that poorly Denies recent bleeding Her appetite is stable since surgery  REVIEW OF SYSTEMS:   Constitutional: Denies fevers, chills or abnormal weight loss Eyes: Denies blurriness of vision Ears, nose, mouth, throat, and face: Denies mucositis or sore throat Respiratory: Denies cough, dyspnea or wheezes Cardiovascular: Denies palpitation, chest discomfort or lower extremity swelling Gastrointestinal:  Denies nausea, heartburn or change in bowel habits Skin: Denies abnormal skin rashes Lymphatics: Denies new lymphadenopathy or easy bruising Neurological:Denies numbness, tingling or new weaknesses Behavioral/Psych: Mood is stable, no new changes  All other systems were reviewed with the patient and are negative.  I have reviewed the past medical history, past surgical history, social history and family history with the patient and they are unchanged  from previous note.  ALLERGIES:  has No Known Allergies.  MEDICATIONS:  Current Outpatient Medications  Medication Sig Dispense Refill   Cholecalciferol (DIALYVITE VITAMIN D 5000) 125 MCG (5000 UT) capsule Take 5,000 Units by  mouth every evening.     levothyroxine (SYNTHROID) 125 MCG tablet Take 125 mcg by mouth daily before breakfast.     lidocaine-prilocaine (EMLA) cream Apply to affected area once 30 g 3   losartan (COZAAR) 25 MG tablet Take 0.5 tablets (12.5 mg total) by mouth at bedtime. 45 tablet 3   magnesium oxide (MAG-OX) 400 (240 Mg) MG tablet Take 1 tablet (400 mg total) by mouth daily. 30 tablet 0   metoprolol succinate (TOPROL XL) 25 MG 24 hr tablet Take 0.5 tablets (12.5 mg total) by mouth at bedtime. 45 tablet 3   pantoprazole (PROTONIX) 40 MG tablet Take 40 mg by mouth daily before breakfast.     rivaroxaban (XARELTO) 10 MG TABS tablet Take 1 tablet (10 mg total) by mouth daily. For AFTER surgery, take once a day at the same time each day, start the day after leaving the hospital 28 tablet 0   rosuvastatin (CRESTOR) 5 MG tablet Take 1 tablet (5 mg total) by mouth 3 (three) times a week. (Patient taking differently: Take 5 mg by mouth every Monday, Wednesday, and Friday at 8 PM. In the evening.) 30 tablet 3   senna-docusate (SENOKOT-S) 8.6-50 MG tablet Take 2 tablets by mouth at bedtime. For AFTER surgery, do not take if having diarrhea 30 tablet 0   vitamin B-12 (CYANOCOBALAMIN) 500 MCG tablet Take 500 mcg by mouth every evening.     No current facility-administered medications for this visit.   Facility-Administered Medications Ordered in Other Visits  Medication Dose Route Frequency Provider Last Rate Last Admin   CARBOplatin (PARAPLATIN) 500 mg in sodium chloride 0.9 % 250 mL chemo infusion  500 mg Intravenous Once Alvy Bimler, Roland Prine, MD       heparin lock flush 100 unit/mL  500 Units Intracatheter Once PRN Alvy Bimler, Nyssa Sayegh, MD       PACLitaxel (TAXOL) 252 mg in sodium chloride 0.9 % 250 mL chemo infusion (> 69m/m2)  140 mg/m2 (Treatment Plan Recorded) Intravenous Once GAlvy Bimler Rolando Whitby, MD 97 mL/hr at 12/07/22 1234 252 mg at 12/07/22 1234   sodium chloride flush (NS) 0.9 % injection 10 mL  10 mL Intracatheter PRN  GHeath Lark MD        SUMMARY OF ONCOLOGIC HISTORY: Oncology History  [place]  07/19/2022 Imaging   1. Small left pleural effusion  2. 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[place]   07/23/2022 Cancer Staging   Staging form: Ovary, Fallopian Tube, and Primary Peritoneal Carcinoma, AJCC 8th Edition - Clinical stage from 07/23/2022: FIGO Stage IIIC (cT3c, cN0, cM0) - Signed by GHeath Lark MD on 07/23/2022 Stage prefix: Initial diagnosis   07/23/2022 Pathology Results   FINAL MICROSCOPIC DIAGNOSIS:  - Malignant cells present  - See comment   SPECIMEN ADEQUACY:  Satisfactory for evaluation   DIAGNOSTIC COMMENTS:  Immunohistochemical stains show that the tumor cells are positive for ER, PAX8, p16 and p53 (clonal overexpression pattern), consistent with a high-grade serous carcinoma.  Immunostain for WT1 is also diffusely positive in the tumor cells, most consistent with an ovarian primary.  Dr. LArby Barrettereviewed the case and concurs with the above diagnosis.    07/30/2022 Procedure   Successful right chest port  placement via the right internal jugular vein. The port is ready for immediate use.   08/02/2022 -  Chemotherapy   Patient is on Treatment Plan : OVARIAN Carboplatin (AUC 6) + Paclitaxel (175) q21d X 6 Cycles     08/03/2022 Procedure   Successful ultrasound-guided paracentesis yielding 4.1 liters of peritoneal fluid.   08/09/2022 Tumor Marker   Patient's tumor was tested for the following markers: CA-125. Results of the tumor marker test revealed 452.   08/12/2022 Echocardiogram    1. Left ventricular ejection fraction, by estimation, is 40 to 45%. The left ventricle has mildly decreased function. The left ventricle demonstrates global hypokinesis. Left ventricular diastolic parameters are indeterminate.   2. Right ventricular systolic function is normal. The  right ventricular size is normal.   3. The mitral valve is normal in structure. Mild mitral valve regurgitation. No evidence of mitral stenosis.   4. The aortic valve is normal in structure. Aortic valve regurgitation is mild to moderate. Aortic valve sclerosis/calcification is present, without any evidence of aortic stenosis.   5. The inferior vena cava is normal in size with greater than 50% respiratory variability, suggesting right atrial pressure of 3 mmHg.    09/23/2022 Tumor Marker   Patient's tumor was tested for the following markers: CA-125. Results of the tumor marker test revealed 41.3.   10/07/2022 Imaging   1. Compared to the outside abdominopelvic CT of 07/19/2022, significant improvement in peritoneal carcinomatosis, without bowel obstruction or other acute complication. 2. Decreased size of upper abdominal nodes, possibly also representing response to therapy. 3. No new or progressive disease and no evidence of thoracic metastasis. 4. Bladder wall thickening and mucosal hyperenhancement are suspicious for cystitis. 5. 4 mm left upper lobe pulmonary nodule is most likely benign/incidental but can be re-evaluated at follow-up. 6. Aortic valvular calcifications. Consider echocardiography to evaluate for valvular dysfunction. 7. Coronary artery atherosclerosis. Aortic Atherosclerosis (ICD10-I70.0).     11/11/2022 Surgery   Exploratory laparotomy, lysis of adhesions for approximately 45 minutes, total omentectomy with radical tumor debulking including resection of perigastric nodule, excision and fulguration of multiple small bowel mesenteric nodules, resection of multiple sigmoid epiploica, fulguration of peritoneal lesions along bilateral pelvic sidewalls, peritoneal stripping of bladder peritoneum   Findings: On bimanual exam, no masses appreciated at the vaginal cuff, no nodularity.  On intra-abdominal entry, infracolic omentum retracted and involved by residual tumor implant  measuring approximately 10 x 4 cm.  Nodularity palpated almost up to the splenic flexure.  Supracolic omentum involved to within 2 cm of the greater curvature of the stomach.  Additional 2 x 3 cm implant noted lateral and inferior to the gastric pylorus (sitting anterior to the duodenum and just inferior to the gallbladder).  Ascending and transverse colon normal in appearance.  Small bowel run from the ileocecal valve to the ligament of Treitz.  Rare small nodules within the mesentery noted.  These were all either excised or fulgurated.  Right aspect of the liver and diaphragm smooth to palpation, no visible lesions.  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  No ascites.  Within the pelvis, minimal nodularity seen along bilateral pelvic sidewalls near the infundibulopelvic remnants, suspected to be related to prior hysterectomy and BSO.  Several sigmoid epiploica with tumor implants versus treated tumor, all excised fulgurated.  No nodularity within the deep pelvis although some evidence of what appeared to be treated tumor versus tumor rind involving the bladder peritoneum was  stripped in this location.  Biopsies taken from the posterior cul-de-sac. R0 resection at the end of surgery.   11/11/2022 Pathology Results   A. SMALL BOWEL MESENTARY NODULES: -  Densely fibrotic soft tissue/scar with abundant foamy histiocytes, negative for malignancy.  B. 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  C. PERIGASTRIC IMPLANTS, EXCISION: -  High-grade serous carcinoma.  D. SIGMOID EPIPLOICA, EXCISION: High-grade serous carcinoma in the background of dense stromal fibrosis/scar, calcifications and foamy histiocytes.  E. BLADDER PERITONEUM, EXCISION: -  High-grade  serous carcinoma.  F. CUL DE SAC, POSTERIOR, BIOPSY: -  Focal high-grade serous carcinoma in the background of predominantly broad and foamy histiocytes.  Note: It is noted that the patient had a diagnosis of high-grade serous carcinoma from a cytology of the ascites in September 2023 and was clinically staged as cT3c.  Patient is now status post neoadjuvant chemotherapy.  It is noted that this is a debulking surgery.  No additional testing is performed to reserve tissue for clinician directed testing.      PHYSICAL EXAMINATION: ECOG PERFORMANCE STATUS: 1 - Symptomatic but completely ambulatory  Vitals:   12/07/22 1010  BP: (!) 98/58  Pulse: (!) 108  Resp: 18  Temp: 97.7 F (36.5 C)  SpO2: 99%   Filed Weights   12/07/22 1010  Weight: 157 lb 9.6 oz (71.5 kg)    GENERAL:alert, no distress and comfortable NEURO: alert & oriented x 3 with fluent speech, no focal motor/sensory deficits  LABORATORY DATA:  I have reviewed the data as listed    Component Value Date/Time   NA 140 12/07/2022 0936   K 3.8 12/07/2022 0936   CL 104 12/07/2022 0936   CO2 27 12/07/2022 0936   GLUCOSE 117 (H) 12/07/2022 0936   BUN 24 (H) 12/07/2022 0936   CREATININE 0.89 12/07/2022 0936   CALCIUM 9.3 12/07/2022 0936   PROT 7.6 12/07/2022 0936   PROT 7.2 12/06/2022 1119   ALBUMIN 3.8 12/07/2022 0936   ALBUMIN 3.9 12/06/2022 1119   AST 17 12/07/2022 0936   ALT 13 12/07/2022 0936   ALKPHOS 67 12/07/2022 0936   BILITOT 0.3 12/07/2022 0936   GFRNONAA >60 12/07/2022 0936    No results found for: "SPEP", "UPEP"  Lab Results  Component Value Date   WBC 6.9 12/07/2022   NEUTROABS 3.3 12/07/2022   HGB 9.0 (L) 12/07/2022   HCT 26.8 (L) 12/07/2022   MCV 103.1 (H) 12/07/2022   PLT 401 (H) 12/07/2022      Chemistry      Component Value Date/Time   NA 140 12/07/2022 0936   K 3.8 12/07/2022 0936   CL 104 12/07/2022 0936   CO2 27 12/07/2022 0936   BUN 24 (H) 12/07/2022 0936   CREATININE  0.89 12/07/2022 0936      Component Value Date/Time   CALCIUM 9.3 12/07/2022 0936   ALKPHOS 67 12/07/2022 0936   AST 17 12/07/2022 0936   ALT 13 12/07/2022 0936   BILITOT 0.3 12/07/2022 0936

## 2022-12-08 LAB — CA 125: Cancer Antigen (CA) 125: 10.2 U/mL (ref 0.0–38.1)

## 2022-12-16 ENCOUNTER — Inpatient Hospital Stay: Payer: BC Managed Care – PPO

## 2022-12-16 ENCOUNTER — Other Ambulatory Visit: Payer: Self-pay

## 2022-12-16 DIAGNOSIS — Z79899 Other long term (current) drug therapy: Secondary | ICD-10-CM | POA: Diagnosis not present

## 2022-12-16 DIAGNOSIS — C482 Malignant neoplasm of peritoneum, unspecified: Secondary | ICD-10-CM | POA: Diagnosis not present

## 2022-12-16 DIAGNOSIS — D61818 Other pancytopenia: Secondary | ICD-10-CM | POA: Diagnosis not present

## 2022-12-16 DIAGNOSIS — I429 Cardiomyopathy, unspecified: Secondary | ICD-10-CM | POA: Diagnosis not present

## 2022-12-16 DIAGNOSIS — Z90722 Acquired absence of ovaries, bilateral: Secondary | ICD-10-CM | POA: Diagnosis not present

## 2022-12-16 DIAGNOSIS — R188 Other ascites: Secondary | ICD-10-CM | POA: Diagnosis not present

## 2022-12-16 DIAGNOSIS — I700 Atherosclerosis of aorta: Secondary | ICD-10-CM | POA: Diagnosis not present

## 2022-12-16 DIAGNOSIS — Z90710 Acquired absence of both cervix and uterus: Secondary | ICD-10-CM | POA: Diagnosis not present

## 2022-12-16 DIAGNOSIS — D6481 Anemia due to antineoplastic chemotherapy: Secondary | ICD-10-CM | POA: Diagnosis not present

## 2022-12-16 DIAGNOSIS — Z8571 Personal history of Hodgkin lymphoma: Secondary | ICD-10-CM | POA: Diagnosis not present

## 2022-12-16 DIAGNOSIS — T451X5A Adverse effect of antineoplastic and immunosuppressive drugs, initial encounter: Secondary | ICD-10-CM | POA: Diagnosis not present

## 2022-12-16 DIAGNOSIS — Z853 Personal history of malignant neoplasm of breast: Secondary | ICD-10-CM | POA: Diagnosis not present

## 2022-12-16 DIAGNOSIS — I2510 Atherosclerotic heart disease of native coronary artery without angina pectoris: Secondary | ICD-10-CM | POA: Diagnosis not present

## 2022-12-16 DIAGNOSIS — Z7901 Long term (current) use of anticoagulants: Secondary | ICD-10-CM | POA: Diagnosis not present

## 2022-12-16 DIAGNOSIS — R197 Diarrhea, unspecified: Secondary | ICD-10-CM | POA: Diagnosis not present

## 2022-12-16 DIAGNOSIS — R5383 Other fatigue: Secondary | ICD-10-CM | POA: Diagnosis not present

## 2022-12-16 LAB — CBC WITH DIFFERENTIAL (CANCER CENTER ONLY)
Abs Immature Granulocytes: 0.01 10*3/uL (ref 0.00–0.07)
Basophils Absolute: 0 10*3/uL (ref 0.0–0.1)
Basophils Relative: 1 %
Eosinophils Absolute: 0.1 10*3/uL (ref 0.0–0.5)
Eosinophils Relative: 1 %
HCT: 25.9 % — ABNORMAL LOW (ref 36.0–46.0)
Hemoglobin: 8.8 g/dL — ABNORMAL LOW (ref 12.0–15.0)
Immature Granulocytes: 0 %
Lymphocytes Relative: 59 %
Lymphs Abs: 2.7 10*3/uL (ref 0.7–4.0)
MCH: 34.9 pg — ABNORMAL HIGH (ref 26.0–34.0)
MCHC: 34 g/dL (ref 30.0–36.0)
MCV: 102.8 fL — ABNORMAL HIGH (ref 80.0–100.0)
Monocytes Absolute: 0.4 10*3/uL (ref 0.1–1.0)
Monocytes Relative: 8 %
Neutro Abs: 1.4 10*3/uL — ABNORMAL LOW (ref 1.7–7.7)
Neutrophils Relative %: 31 %
Platelet Count: 264 10*3/uL (ref 150–400)
RBC: 2.52 MIL/uL — ABNORMAL LOW (ref 3.87–5.11)
RDW: 15.7 % — ABNORMAL HIGH (ref 11.5–15.5)
WBC Count: 4.6 10*3/uL (ref 4.0–10.5)
nRBC: 0 % (ref 0.0–0.2)

## 2022-12-16 LAB — SAMPLE TO BLOOD BANK

## 2022-12-16 LAB — VITAMIN B12: Vitamin B-12: 509 pg/mL (ref 180–914)

## 2022-12-16 LAB — IRON AND IRON BINDING CAPACITY (CC-WL,HP ONLY)
Iron: 63 ug/dL (ref 28–170)
Saturation Ratios: 18 % (ref 10.4–31.8)
TIBC: 347 ug/dL (ref 250–450)
UIBC: 284 ug/dL (ref 148–442)

## 2022-12-16 MED ORDER — SODIUM CHLORIDE 0.9% FLUSH
10.0000 mL | Freq: Once | INTRAVENOUS | Status: AC
  Administered 2022-12-16: 10 mL

## 2022-12-16 NOTE — Progress Notes (Signed)
Per Dr. Alvy Bimler, patient does not need blood today. Patient verbalized understanding. D/c in stable condition.

## 2022-12-17 LAB — FERRITIN: Ferritin: 257 ng/mL (ref 11–307)

## 2022-12-20 ENCOUNTER — Encounter: Payer: Self-pay | Admitting: Internal Medicine

## 2022-12-20 DIAGNOSIS — E785 Hyperlipidemia, unspecified: Secondary | ICD-10-CM

## 2022-12-21 ENCOUNTER — Encounter: Payer: Self-pay | Admitting: Hematology and Oncology

## 2022-12-21 MED ORDER — ROSUVASTATIN CALCIUM 5 MG PO TABS: 5.0000 mg | ORAL_TABLET | Freq: Every day | ORAL | 3 refills | Status: DC

## 2022-12-26 ENCOUNTER — Encounter: Payer: Self-pay | Admitting: Hematology and Oncology

## 2022-12-27 MED FILL — Fosaprepitant Dimeglumine For IV Infusion 150 MG (Base Eq): INTRAVENOUS | Qty: 5 | Status: AC

## 2022-12-27 MED FILL — Dexamethasone Sodium Phosphate Inj 100 MG/10ML: INTRAMUSCULAR | Qty: 1 | Status: AC

## 2022-12-28 ENCOUNTER — Other Ambulatory Visit: Payer: Self-pay

## 2022-12-28 ENCOUNTER — Encounter: Payer: Self-pay | Admitting: Hematology and Oncology

## 2022-12-28 ENCOUNTER — Inpatient Hospital Stay: Payer: BC Managed Care – PPO | Attending: Gynecologic Oncology

## 2022-12-28 ENCOUNTER — Telehealth: Payer: Self-pay

## 2022-12-28 ENCOUNTER — Telehealth: Payer: Self-pay | Admitting: Pharmacy Technician

## 2022-12-28 ENCOUNTER — Inpatient Hospital Stay (HOSPITAL_BASED_OUTPATIENT_CLINIC_OR_DEPARTMENT_OTHER): Payer: BC Managed Care – PPO | Admitting: Hematology and Oncology

## 2022-12-28 ENCOUNTER — Inpatient Hospital Stay: Payer: BC Managed Care – PPO

## 2022-12-28 ENCOUNTER — Other Ambulatory Visit (HOSPITAL_COMMUNITY): Payer: Self-pay

## 2022-12-28 VITALS — BP 128/75 | HR 111 | Temp 97.4°F | Resp 18 | Ht 63.0 in | Wt 161.6 lb

## 2022-12-28 VITALS — BP 131/72 | HR 101 | Resp 16

## 2022-12-28 DIAGNOSIS — D6481 Anemia due to antineoplastic chemotherapy: Secondary | ICD-10-CM | POA: Insufficient documentation

## 2022-12-28 DIAGNOSIS — C482 Malignant neoplasm of peritoneum, unspecified: Secondary | ICD-10-CM

## 2022-12-28 DIAGNOSIS — T451X5A Adverse effect of antineoplastic and immunosuppressive drugs, initial encounter: Secondary | ICD-10-CM | POA: Diagnosis not present

## 2022-12-28 DIAGNOSIS — K5900 Constipation, unspecified: Secondary | ICD-10-CM | POA: Insufficient documentation

## 2022-12-28 DIAGNOSIS — R188 Other ascites: Secondary | ICD-10-CM | POA: Insufficient documentation

## 2022-12-28 DIAGNOSIS — J90 Pleural effusion, not elsewhere classified: Secondary | ICD-10-CM | POA: Insufficient documentation

## 2022-12-28 DIAGNOSIS — I952 Hypotension due to drugs: Secondary | ICD-10-CM | POA: Insufficient documentation

## 2022-12-28 DIAGNOSIS — Z79899 Other long term (current) drug therapy: Secondary | ICD-10-CM | POA: Diagnosis not present

## 2022-12-28 DIAGNOSIS — Z79633 Long term (current) use of mitotic inhibitor: Secondary | ICD-10-CM | POA: Insufficient documentation

## 2022-12-28 DIAGNOSIS — Z79630 Long term (current) use of alkylating agent: Secondary | ICD-10-CM | POA: Diagnosis not present

## 2022-12-28 DIAGNOSIS — Z5111 Encounter for antineoplastic chemotherapy: Secondary | ICD-10-CM | POA: Insufficient documentation

## 2022-12-28 LAB — CBC WITH DIFFERENTIAL (CANCER CENTER ONLY)
Abs Immature Granulocytes: 0.04 10*3/uL (ref 0.00–0.07)
Basophils Absolute: 0 10*3/uL (ref 0.0–0.1)
Basophils Relative: 0 %
Eosinophils Absolute: 0 10*3/uL (ref 0.0–0.5)
Eosinophils Relative: 0 %
HCT: 28.8 % — ABNORMAL LOW (ref 36.0–46.0)
Hemoglobin: 9.6 g/dL — ABNORMAL LOW (ref 12.0–15.0)
Immature Granulocytes: 0 %
Lymphocytes Relative: 17 %
Lymphs Abs: 1.5 10*3/uL (ref 0.7–4.0)
MCH: 34.4 pg — ABNORMAL HIGH (ref 26.0–34.0)
MCHC: 33.3 g/dL (ref 30.0–36.0)
MCV: 103.2 fL — ABNORMAL HIGH (ref 80.0–100.0)
Monocytes Absolute: 0.1 10*3/uL (ref 0.1–1.0)
Monocytes Relative: 1 %
Neutro Abs: 7.3 10*3/uL (ref 1.7–7.7)
Neutrophils Relative %: 82 %
Platelet Count: 361 10*3/uL (ref 150–400)
RBC: 2.79 MIL/uL — ABNORMAL LOW (ref 3.87–5.11)
RDW: 15.2 % (ref 11.5–15.5)
WBC Count: 8.9 10*3/uL (ref 4.0–10.5)
nRBC: 0 % (ref 0.0–0.2)

## 2022-12-28 LAB — CMP (CANCER CENTER ONLY)
ALT: 18 U/L (ref 0–44)
AST: 18 U/L (ref 15–41)
Albumin: 4 g/dL (ref 3.5–5.0)
Alkaline Phosphatase: 73 U/L (ref 38–126)
Anion gap: 9 (ref 5–15)
BUN: 24 mg/dL — ABNORMAL HIGH (ref 6–20)
CO2: 24 mmol/L (ref 22–32)
Calcium: 9.3 mg/dL (ref 8.9–10.3)
Chloride: 105 mmol/L (ref 98–111)
Creatinine: 0.94 mg/dL (ref 0.44–1.00)
GFR, Estimated: >60 mL/min (ref >60)
Glucose, Bld: 167 mg/dL — ABNORMAL HIGH (ref 70–99)
Potassium: 3.8 mmol/L (ref 3.5–5.1)
Sodium: 138 mmol/L (ref 135–145)
Total Bilirubin: 0.2 mg/dL — ABNORMAL LOW (ref 0.3–1.2)
Total Protein: 8.3 g/dL — ABNORMAL HIGH (ref 6.5–8.1)

## 2022-12-28 MED ORDER — OLAPARIB 100 MG PO TABS
200.0000 mg | ORAL_TABLET | Freq: Two times a day (BID) | ORAL | 0 refills | Status: DC
  Filled 2022-12-28: qty 120, 30d supply, fill #0

## 2022-12-28 MED ORDER — SODIUM CHLORIDE 0.9 % IV SOLN
10.0000 mg | Freq: Once | INTRAVENOUS | Status: AC
  Administered 2022-12-28: 10 mg via INTRAVENOUS
  Filled 2022-12-28: qty 10

## 2022-12-28 MED ORDER — PALONOSETRON HCL INJECTION 0.25 MG/5ML
0.2500 mg | Freq: Once | INTRAVENOUS | Status: AC
  Administered 2022-12-28: 0.25 mg via INTRAVENOUS
  Filled 2022-12-28: qty 5

## 2022-12-28 MED ORDER — CETIRIZINE HCL 10 MG/ML IV SOLN
10.0000 mg | Freq: Once | INTRAVENOUS | Status: AC
  Administered 2022-12-28: 10 mg via INTRAVENOUS
  Filled 2022-12-28: qty 1

## 2022-12-28 MED ORDER — SODIUM CHLORIDE 0.9 % IV SOLN
140.0000 mg/m2 | Freq: Once | INTRAVENOUS | Status: AC
  Administered 2022-12-28: 252 mg via INTRAVENOUS
  Filled 2022-12-28: qty 42

## 2022-12-28 MED ORDER — SODIUM CHLORIDE 0.9% FLUSH
10.0000 mL | INTRAVENOUS | Status: DC | PRN
  Administered 2022-12-28: 10 mL

## 2022-12-28 MED ORDER — HEPARIN SOD (PORK) LOCK FLUSH 100 UNIT/ML IV SOLN
500.0000 [IU] | Freq: Once | INTRAVENOUS | Status: AC | PRN
  Administered 2022-12-28: 500 [IU]

## 2022-12-28 MED ORDER — SODIUM CHLORIDE 0.9 % IV SOLN
475.5000 mg | Freq: Once | INTRAVENOUS | Status: AC
  Administered 2022-12-28: 500 mg via INTRAVENOUS
  Filled 2022-12-28: qty 50

## 2022-12-28 MED ORDER — SODIUM CHLORIDE 0.9 % IV SOLN: Freq: Once | INTRAVENOUS | Status: AC

## 2022-12-28 MED ORDER — FAMOTIDINE IN NACL 20-0.9 MG/50ML-% IV SOLN
20.0000 mg | Freq: Once | INTRAVENOUS | Status: AC
  Administered 2022-12-28: 20 mg via INTRAVENOUS
  Filled 2022-12-28: qty 50

## 2022-12-28 MED ORDER — SODIUM CHLORIDE 0.9 % IV SOLN
150.0000 mg | Freq: Once | INTRAVENOUS | Status: AC
  Administered 2022-12-28: 150 mg via INTRAVENOUS
  Filled 2022-12-28: qty 150

## 2022-12-28 MED ORDER — SODIUM CHLORIDE 0.9% FLUSH
10.0000 mL | Freq: Once | INTRAVENOUS | Status: AC
  Administered 2022-12-28: 10 mL

## 2022-12-28 NOTE — Telephone Encounter (Signed)
Oral Oncology Patient Advocate Encounter   Received notification that prior authorization for [facility]ie [facility] is required.   PA submitted on 12/28/22 Key [id number] Status is pending     Lady Deutscher, CPhT-Adv Oncology Pharmacy Patient [place] Direct Number: [phone]  Fax: [phone]

## 2022-12-28 NOTE — Telephone Encounter (Signed)
Oral Oncology Patient Advocate Encounter  Prior Authorization for Lauren [facility] has been approved.    PA# [id number] Effective dates: 12/28/22 through 12/28/23  Patient must fill at [facility].    Lady Deutscher, CPhT-Adv Oncology Pharmacy Patient [facility] Direct Number: [phone]  Fax: [phone]

## 2022-12-28 NOTE — Telephone Encounter (Signed)
Oral Oncology Patient Advocate Encounter   Was successful in obtaining a copay card for [place].  This copay card will make the patients copay $0.  I have spoken with the patient.    The billing information is as follows and has been shared with [facility].   RxBin: [id number] PCN: [id number] Member ID: [id number] Group ID: [id number]   Lady Deutscher, CPhT-Adv Oncology Pharmacy Patient [place] Direct Number: [phone]  Fax: [phone]

## 2022-12-28 NOTE — Patient Instructions (Signed)
[facility]  Discharge Instructions: Thank you for choosing [facility] to provide your oncology and hematology care.   If you have a lab appointment with the [facility], please go directly to the [facility] and check in at the registration area.   Wear comfortable clothing and clothing appropriate for easy access to any Portacath or PICC line.   We strive to give you quality time with your provider. You may need to reschedule your appointment if you arrive late (15 or more minutes).  Arriving late affects you and other patients whose appointments are after yours.  Also, if you miss three or more appointments without notifying the office, you may be dismissed from the clinic at the provider's discretion.      For prescription refill requests, have your pharmacy contact our office and allow 72 hours for refills to be completed.    Today you received the following chemotherapy and/or immunotherapy agents: paclitaxel and carboplatin      To help prevent nausea and vomiting after your treatment, we encourage you to take your nausea medication as directed.  BELOW ARE SYMPTOMS THAT SHOULD BE REPORTED IMMEDIATELY: *FEVER GREATER THAN 100.4 F (38 C) OR HIGHER *CHILLS OR SWEATING *NAUSEA AND VOMITING THAT IS NOT CONTROLLED WITH YOUR NAUSEA MEDICATION *UNUSUAL SHORTNESS OF BREATH *UNUSUAL BRUISING OR BLEEDING *URINARY PROBLEMS (pain or burning when urinating, or frequent urination) *BOWEL PROBLEMS (unusual diarrhea, constipation, pain near the anus) TENDERNESS IN MOUTH AND THROAT WITH OR WITHOUT PRESENCE OF ULCERS (sore throat, sores in mouth, or a toothache) UNUSUAL RASH, SWELLING OR PAIN  UNUSUAL VAGINAL DISCHARGE OR ITCHING   Items with * indicate a potential emergency and should be followed up as soon as possible or go to the Emergency Department if any problems should occur.  Please show the CHEMOTHERAPY ALERT CARD or IMMUNOTHERAPY  ALERT CARD at check-in to the Emergency Department and triage nurse.  Should you have questions after your visit or need to cancel or reschedule your appointment, please contact [facility]  Dept: [phone]  and follow the prompts.  Office hours are 8:00 a.m. to 4:30 p.m. Monday - Friday. Please note that voicemails left after 4:00 p.m. may not be returned until the following business day.  We are closed weekends and major holidays. You have access to a nurse at all times for urgent questions. Please call the main number to the clinic Dept: [phone] and follow the prompts.   For any non-urgent questions, you may also contact your provider using MyChart. We now offer e-Visits for anyone 80 and older to request care online for non-urgent symptoms. For details visit [email or web address].   Also download the MyChart app! Go to the app store, search "MyChart", open the app, select [facility], and log in with your MyChart username and password.

## 2022-12-28 NOTE — Assessment & Plan Note (Addendum)
We discussed the role of adjuvant treatment The patient is interested to stop treatment after today's chemotherapy I plan to order CT imaging in the month for further follow-up I would recommend we start her on olaparib as maintenance treatment for minimum 2 years starting next month after her return visit Due to her mild baseline anemia and history of pancytopenia, I will start her on reduced dose olaparib 200 mg twice daily in the future We discussed the risk, benefits, side effects of olaparib and she is in agreement

## 2022-12-28 NOTE — Assessment & Plan Note (Signed)
Her blood pressure is better since she held her antihypertensives Monitor closely

## 2022-12-28 NOTE — Patient Instructions (Signed)

## 2022-12-28 NOTE — Progress Notes (Signed)
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[facility]) We discussed the role of adjuvant treatment The patient is interested to stop treatment after today's chemotherapy I plan to order CT imaging in the month for further follow-up I would recommend we start her on olaparib as maintenance treatment for minimum 2 years starting next month after her return visit Due to her mild baseline anemia and history of pancytopenia, I will start her on reduced dose olaparib 200 mg twice daily in the future We discussed the risk, benefits, side effects of olaparib and she is in agreement  Anemia due to antineoplastic chemotherapy Her anemia has improved Her energy level has improved As above, we will start her maintenance treatment a month from now with reduced dose in the future  Drug-induced hypotension Her blood pressure is better since she held her antihypertensives Monitor closely  Orders Placed This Encounter  Procedures   CT ABDOMEN PELVIS W CONTRAST    Standing Status:   Future    Standing Expiration Date:   12/28/2023    Order Specific Question:   If indicated for the ordered procedure, I authorize the administration of contrast media per Radiology protocol    Answer:   Yes    Order Specific Question:   Preferred imaging location?    Answer:   [facility]    Order Specific Question:   Radiology Contrast Protocol - do NOT remove file path    Answer:   [email or web address]    Order Specific Question:   Is patient pregnant?    Answer:   No   CBC with Differential/Platelet    Standing Status:   Standing    Number of Occurrences:   22    Standing Expiration Date:   12/28/2023   Comprehensive metabolic  panel    Standing Status:   Standing    Number of Occurrences:   33    Standing Expiration Date:   12/28/2023   CA 125    Standing Status:   Standing    Number of Occurrences:   11    Standing Expiration Date:   12/28/2023    All questions were answered. The patient knows to call the clinic with any problems, questions or concerns. The total time spent in the appointment was 30 minutes encounter with patients including review of chart and various tests results, discussions about plan of care and coordination of care plan   Heath Lark, MD 12/28/2022 12:15 PM  INTERVAL HISTORY: Please see below for problem oriented charting. she returns for treatment follow-up seen prior to cycle 6 of chemotherapy Her energy level has improved She had diarrhea for few days after treatment preceded by constipation No peripheral neuropathy She felt stronger since she held her blood pressure medications  REVIEW OF SYSTEMS:   Constitutional: Denies fevers, chills or abnormal weight loss Eyes: Denies blurriness of vision Ears, nose, mouth, throat, and face: Denies mucositis or sore throat Respiratory: Denies cough, dyspnea or wheezes Cardiovascular: Denies palpitation, chest discomfort or lower extremity swelling Gastrointestinal:  Denies nausea, heartburn or change in bowel habits Skin: Denies abnormal skin rashes Lymphatics: Denies new lymphadenopathy or easy bruising Neurological:Denies numbness, tingling or new weaknesses Behavioral/Psych: Mood is stable, no new changes  All other systems were reviewed with the patient  and are negative.  I have reviewed the past medical history, past surgical history, social history and family history with the patient and they are unchanged from previous note.  ALLERGIES:  has No Known Allergies.  MEDICATIONS:  Current Outpatient Medications  Medication Sig Dispense Refill   olaparib (LYNPARZA) 100 MG tablet Take 2 tablets (200 mg total) by mouth 2 (two) times  daily. Swallow whole. May take with food to decrease nausea and vomiting. 120 tablet 0   Cholecalciferol (DIALYVITE VITAMIN D 5000) 125 MCG (5000 UT) capsule Take 5,000 Units by mouth every evening.     dexamethasone (DECADRON) 4 MG tablet Take 4 mg by mouth every 21 ( twenty-one) days. Take 2 tabs at the night before and 2 tab the morning of chemotherapy, every 3 weeks, by mouth x 6 cycles     levothyroxine (SYNTHROID) 125 MCG tablet Take 125 mcg by mouth daily before breakfast.     lidocaine-prilocaine (EMLA) cream Apply to affected area once 30 g 3   losartan (COZAAR) 25 MG tablet Take 0.5 tablets (12.5 mg total) by mouth at bedtime. 45 tablet 3   metoprolol succinate (TOPROL XL) 25 MG 24 hr tablet Take 0.5 tablets (12.5 mg total) by mouth at bedtime. 45 tablet 3   pantoprazole (PROTONIX) 40 MG tablet Take 40 mg by mouth daily before breakfast.     rosuvastatin (CRESTOR) 5 MG tablet Take 1 tablet (5 mg total) by mouth daily. 90 tablet 3   senna-docusate (SENOKOT-S) 8.6-50 MG tablet Take 2 tablets by mouth at bedtime. For AFTER surgery, do not take if having diarrhea 30 tablet 0   vitamin B-12 (CYANOCOBALAMIN) 500 MCG tablet Take 500 mcg by mouth every evening.     No current facility-administered medications for this visit.   Facility-Administered Medications Ordered in Other Visits  Medication Dose Route Frequency Provider Last Rate Last Admin   CARBOplatin (PARAPLATIN) 500 mg in sodium chloride 0.9 % 250 mL chemo infusion  500 mg Intravenous Once Alvy Bimler, Candance Bohlman, MD       heparin lock flush 100 unit/mL  500 Units Intracatheter Once PRN Alvy Bimler, Fox Salminen, MD       PACLitaxel (TAXOL) 252 mg in sodium chloride 0.9 % 250 mL chemo infusion (> '80mg'$ /m2)  140 mg/m2 (Treatment Plan Recorded) Intravenous Once Aleea Hendry, MD 97 mL/hr at 12/28/22 1158 252 mg at 12/28/22 1158   sodium chloride flush (NS) 0.9 % injection 10 mL  10 mL Intracatheter PRN Heath Lark, MD        SUMMARY OF ONCOLOGIC  HISTORY: Oncology History  Primary peritoneal adenocarcinoma ([place])  07/19/2022 Imaging   1. Small left pleural effusion  2. 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[place])   07/23/2022 Cancer Staging   Staging form: Ovary, Fallopian Tube, and Primary Peritoneal Carcinoma, AJCC 8th Edition - Clinical stage from 07/23/2022: FIGO Stage IIIC (cT3c, cN0, cM0) - Signed by Heath Lark, MD on 07/23/2022 Stage prefix: Initial diagnosis   07/23/2022 Pathology Results   FINAL MICROSCOPIC DIAGNOSIS:  - Malignant cells present  - See comment   SPECIMEN ADEQUACY:  Satisfactory for evaluation   DIAGNOSTIC COMMENTS:  Immunohistochemical stains show that the tumor cells are positive for ER, PAX8, p16 and p53 (clonal overexpression pattern), consistent with a high-grade serous carcinoma.  Immunostain for WT1 is also diffusely positive in the tumor cells, most consistent with an ovarian primary.  Dr. Arby Barrette reviewed the case and concurs  with the above diagnosis.    07/30/2022 Procedure   Successful right chest port placement via the right internal jugular vein. The port is ready for immediate use.   08/02/2022 -  Chemotherapy   Patient is on Treatment Plan : OVARIAN Carboplatin (AUC 6) + Paclitaxel (175) q21d X 6 Cycles     08/03/2022 Procedure   Successful ultrasound-guided paracentesis yielding 4.1 liters of peritoneal fluid.   08/09/2022 Tumor Marker   Patient's tumor was tested for the following markers: CA-125. Results of the tumor marker test revealed 452.   08/12/2022 Echocardiogram    1. Left ventricular ejection fraction, by estimation, is 40 to 45%. The left ventricle has mildly decreased function. The left ventricle demonstrates global hypokinesis. Left ventricular diastolic parameters are indeterminate.   2. Right ventricular systolic function is normal. The right ventricular size is normal.   3. The  mitral valve is normal in structure. Mild mitral valve regurgitation. No evidence of mitral stenosis.   4. The aortic valve is normal in structure. Aortic valve regurgitation is mild to moderate. Aortic valve sclerosis/calcification is present, without any evidence of aortic stenosis.   5. The inferior vena cava is normal in size with greater than 50% respiratory variability, suggesting right atrial pressure of 3 mmHg.    09/23/2022 Tumor Marker   Patient's tumor was tested for the following markers: CA-125. Results of the tumor marker test revealed 41.3.   10/07/2022 Imaging   1. Compared to the outside abdominopelvic CT of 07/19/2022, significant improvement in peritoneal carcinomatosis, without bowel obstruction or other acute complication. 2. Decreased size of upper abdominal nodes, possibly also representing response to therapy. 3. No new or progressive disease and no evidence of thoracic metastasis. 4. Bladder wall thickening and mucosal hyperenhancement are suspicious for cystitis. 5. 4 mm left upper lobe pulmonary nodule is most likely benign/incidental but can be re-evaluated at follow-up. 6. Aortic valvular calcifications. Consider echocardiography to evaluate for valvular dysfunction. 7. Coronary artery atherosclerosis. Aortic Atherosclerosis (ICD10-I70.0).     11/11/2022 Surgery   Exploratory laparotomy, lysis of adhesions for approximately 45 minutes, total omentectomy with radical tumor debulking including resection of perigastric nodule, excision and fulguration of multiple small bowel mesenteric nodules, resection of multiple sigmoid epiploica, fulguration of peritoneal lesions along bilateral pelvic sidewalls, peritoneal stripping of bladder peritoneum   Findings: On bimanual exam, no masses appreciated at the vaginal cuff, no nodularity.  On intra-abdominal entry, infracolic omentum retracted and involved by residual tumor implant measuring approximately 10 x 4 cm.  Nodularity  palpated almost up to the splenic flexure.  Supracolic omentum involved to within 2 cm of the greater curvature of the stomach.  Additional 2 x 3 cm implant noted lateral and inferior to the gastric pylorus (sitting anterior to the duodenum and just inferior to the gallbladder).  Ascending and transverse colon normal in appearance.  Small bowel run from the ileocecal valve to the ligament of Treitz.  Rare small nodules within the mesentery noted.  These were all either excised or fulgurated.  Right aspect of the liver and diaphragm smooth to palpation, no visible lesions.  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  No ascites.  Within the pelvis, minimal nodularity seen along bilateral pelvic sidewalls near the infundibulopelvic remnants, suspected to be related to prior hysterectomy and BSO.  Several sigmoid epiploica with tumor implants versus treated tumor, all excised fulgurated.  No nodularity within the deep pelvis although some evidence  of what appeared to be treated tumor versus tumor rind involving the bladder peritoneum was stripped in this location.  Biopsies taken from the posterior cul-de-sac. R0 resection at the end of surgery.   11/11/2022 Pathology Results   A. SMALL BOWEL MESENTARY NODULES: -  Densely fibrotic soft tissue/scar with abundant foamy histiocytes, negative for malignancy.  B. 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  C. PERIGASTRIC IMPLANTS, EXCISION: -  High-grade serous carcinoma.  D. SIGMOID EPIPLOICA, EXCISION: High-grade serous carcinoma in the background of dense stromal fibrosis/scar, calcifications and foamy histiocytes.  E. BLADDER PERITONEUM, EXCISION: -  High-grade serous carcinoma.  F. CUL DE SAC, POSTERIOR,  BIOPSY: -  Focal high-grade serous carcinoma in the background of predominantly broad and foamy histiocytes.  Note: It is noted that the patient had a diagnosis of high-grade serous carcinoma from a cytology of the ascites in September 2023 and was clinically staged as cT3c.  Patient is now status post neoadjuvant chemotherapy.  It is noted that this is a debulking surgery.  No additional testing is performed to reserve tissue for clinician directed testing.    12/08/2022 Tumor Marker   Patient's tumor was tested for the following markers: CA-125. Results of the tumor marker test revealed 10.2.     PHYSICAL EXAMINATION: ECOG PERFORMANCE STATUS: 1 - Symptomatic but completely ambulatory  Vitals:   12/28/22 0917  BP: 128/75  Pulse: (!) 111  Resp: 18  Temp: (!) 97.4 F (36.3 C)  SpO2: 98%   Filed Weights   12/28/22 0917  Weight: 161 lb 9.6 oz (73.3 kg)    GENERAL:alert, no distress and comfortable NEURO: alert & oriented x 3 with fluent speech, no focal motor/sensory deficits  LABORATORY DATA:  I have reviewed the data as listed    Component Value Date/Time   NA 138 12/28/2022 0900   K 3.8 12/28/2022 0900   CL 105 12/28/2022 0900   CO2 24 12/28/2022 0900   GLUCOSE 167 (H) 12/28/2022 0900   BUN 24 (H) 12/28/2022 0900   CREATININE 0.94 12/28/2022 0900   CALCIUM 9.3 12/28/2022 0900   PROT 8.3 (H) 12/28/2022 0900   PROT 7.2 12/06/2022 1119   ALBUMIN 4.0 12/28/2022 0900   ALBUMIN 3.9 12/06/2022 1119   AST 18 12/28/2022 0900   ALT 18 12/28/2022 0900   ALKPHOS 73 12/28/2022 0900   BILITOT 0.2 (L) 12/28/2022 0900   GFRNONAA >60 12/28/2022 0900    No results found for: "SPEP", "UPEP"  Lab Results  Component Value Date   WBC 8.9 12/28/2022   NEUTROABS 7.3 12/28/2022   HGB 9.6 (L) 12/28/2022   HCT 28.8 (L) 12/28/2022   MCV 103.2 (H) 12/28/2022   PLT 361 12/28/2022      Chemistry      Component Value Date/Time   NA 138 12/28/2022 0900   K 3.8 12/28/2022  0900   CL 105 12/28/2022 0900   CO2 24 12/28/2022 0900   BUN 24 (H) 12/28/2022 0900   CREATININE 0.94 12/28/2022 0900      Component Value Date/Time   CALCIUM 9.3 12/28/2022 0900   ALKPHOS 73 12/28/2022 0900   AST 18 12/28/2022 0900   ALT 18 12/28/2022 0900   BILITOT 0.2 (L) 12/28/2022 0900

## 2022-12-28 NOTE — Assessment & Plan Note (Signed)
Her anemia has improved Her energy level has improved As above, we will start her maintenance treatment a month from now with reduced dose in the future

## 2022-12-28 NOTE — Telephone Encounter (Addendum)
Oral Oncology Pharmacist Encounter  Received new prescription for Lynparza for the treatment of primary peritoneal adenocarcinoma, planned duration minimum of 2 years or until unacceptable toxicity.  Labs from 12/07/2022 assessed, no interventions needed.  MD dose reduced due to pt's anemia.  Prescription dose and frequency assessed for appropriateness.  Current medication list in Epic reviewed, no DDIs with [place] identified.  Evaluated chart and no patient barriers to medication adherence noted.   Patient agreement for treatment documented in MD note on 12/28/2022.  Prescription has been e-scribed to the [facility] for benefits analysis and approval.  Oral Oncology Clinic will continue to follow for insurance authorization, copayment issues, initial counseling and start date.  Donney Rankins, PharmD Candidate 12/28/2022 2:43 PM Oral Oncology Clinic [phone]

## 2023-01-10 ENCOUNTER — Encounter: Payer: Self-pay | Admitting: Hematology and Oncology

## 2023-01-12 MED ORDER — OLAPARIB 100 MG PO TABS: 200.0000 mg | ORAL_TABLET | Freq: Two times a day (BID) | ORAL | 0 refills | Status: DC

## 2023-01-28 ENCOUNTER — Telehealth (HOSPITAL_COMMUNITY): Payer: Self-pay | Admitting: Internal Medicine

## 2023-01-28 NOTE — Telephone Encounter (Signed)
01/27/23 Pt left VM @ 6:45pm to cancel her echocardiogram scheduled for 02/09/23.  She did not wish to reschedule. Order will be removed from the active echo wq. Thank you

## 2023-02-01 ENCOUNTER — Inpatient Hospital Stay: Payer: BC Managed Care – PPO | Attending: Gynecologic Oncology

## 2023-02-01 ENCOUNTER — Ambulatory Visit (HOSPITAL_COMMUNITY)
Admission: RE | Admit: 2023-02-01 | Discharge: 2023-02-01 | Disposition: A | Discharge status: Home / Self Care | Payer: BC Managed Care – PPO | Source: Ambulatory Visit | Attending: Hematology and Oncology | Admitting: Hematology and Oncology

## 2023-02-01 ENCOUNTER — Other Ambulatory Visit: Payer: Self-pay

## 2023-02-01 DIAGNOSIS — C482 Malignant neoplasm of peritoneum, unspecified: Secondary | ICD-10-CM

## 2023-02-01 DIAGNOSIS — R7989 Other specified abnormal findings of blood chemistry: Secondary | ICD-10-CM | POA: Insufficient documentation

## 2023-02-01 DIAGNOSIS — T451X5A Adverse effect of antineoplastic and immunosuppressive drugs, initial encounter: Secondary | ICD-10-CM | POA: Insufficient documentation

## 2023-02-01 DIAGNOSIS — Z9081 Acquired absence of spleen: Secondary | ICD-10-CM | POA: Insufficient documentation

## 2023-02-01 DIAGNOSIS — I700 Atherosclerosis of aorta: Secondary | ICD-10-CM | POA: Insufficient documentation

## 2023-02-01 DIAGNOSIS — Z1509 Genetic susceptibility to other malignant neoplasm: Secondary | ICD-10-CM | POA: Insufficient documentation

## 2023-02-01 DIAGNOSIS — D6481 Anemia due to antineoplastic chemotherapy: Secondary | ICD-10-CM | POA: Insufficient documentation

## 2023-02-01 DIAGNOSIS — K5730 Diverticulosis of large intestine without perforation or abscess without bleeding: Secondary | ICD-10-CM | POA: Insufficient documentation

## 2023-02-01 DIAGNOSIS — I2510 Atherosclerotic heart disease of native coronary artery without angina pectoris: Secondary | ICD-10-CM | POA: Insufficient documentation

## 2023-02-01 DIAGNOSIS — I429 Cardiomyopathy, unspecified: Secondary | ICD-10-CM | POA: Insufficient documentation

## 2023-02-01 DIAGNOSIS — Z1501 Genetic susceptibility to malignant neoplasm of breast: Secondary | ICD-10-CM | POA: Insufficient documentation

## 2023-02-01 DIAGNOSIS — Z79899 Other long term (current) drug therapy: Secondary | ICD-10-CM | POA: Insufficient documentation

## 2023-02-01 DIAGNOSIS — Z90722 Acquired absence of ovaries, bilateral: Secondary | ICD-10-CM | POA: Insufficient documentation

## 2023-02-01 LAB — CBC WITH DIFFERENTIAL/PLATELET
Abs Immature Granulocytes: 0.02 10*3/uL (ref 0.00–0.07)
Basophils Absolute: 0 10*3/uL (ref 0.0–0.1)
Basophils Relative: 1 %
Eosinophils Absolute: 0.1 10*3/uL (ref 0.0–0.5)
Eosinophils Relative: 2 %
HCT: 31.2 % — ABNORMAL LOW (ref 36.0–46.0)
Hemoglobin: 10.4 g/dL — ABNORMAL LOW (ref 12.0–15.0)
Immature Granulocytes: 0 %
Lymphocytes Relative: 47 %
Lymphs Abs: 3.1 10*3/uL (ref 0.7–4.0)
MCH: 33.5 pg (ref 26.0–34.0)
MCHC: 33.3 g/dL (ref 30.0–36.0)
MCV: 100.6 fL — ABNORMAL HIGH (ref 80.0–100.0)
Monocytes Absolute: 0.6 10*3/uL (ref 0.1–1.0)
Monocytes Relative: 9 %
Neutro Abs: 2.6 10*3/uL (ref 1.7–7.7)
Neutrophils Relative %: 41 %
Platelets: 451 10*3/uL — ABNORMAL HIGH (ref 150–400)
RBC: 3.1 MIL/uL — ABNORMAL LOW (ref 3.87–5.11)
RDW: 14.8 % (ref 11.5–15.5)
WBC: 6.4 10*3/uL (ref 4.0–10.5)
nRBC: 0 % (ref 0.0–0.2)

## 2023-02-01 LAB — COMPREHENSIVE METABOLIC PANEL
ALT: 13 U/L (ref 0–44)
AST: 16 U/L (ref 15–41)
Albumin: 3.9 g/dL (ref 3.5–5.0)
Alkaline Phosphatase: 78 U/L (ref 38–126)
Anion gap: 6 (ref 5–15)
BUN: 26 mg/dL — ABNORMAL HIGH (ref 6–20)
CO2: 28 mmol/L (ref 22–32)
Calcium: 9.7 mg/dL (ref 8.9–10.3)
Chloride: 104 mmol/L (ref 98–111)
Creatinine, Ser: 0.93 mg/dL (ref 0.44–1.00)
GFR, Estimated: >60 mL/min (ref >60)
Glucose, Bld: 100 mg/dL — ABNORMAL HIGH (ref 70–99)
Potassium: 4.2 mmol/L (ref 3.5–5.1)
Sodium: 138 mmol/L (ref 135–145)
Total Bilirubin: 0.2 mg/dL — ABNORMAL LOW (ref 0.3–1.2)
Total Protein: 8.2 g/dL — ABNORMAL HIGH (ref 6.5–8.1)

## 2023-02-01 MED ORDER — IOHEXOL 9 MG/ML PO SOLN: 500.0000 mL | ORAL | Status: AC

## 2023-02-01 MED ORDER — HEPARIN SOD (PORK) LOCK FLUSH 100 UNIT/ML IV SOLN
500.0000 [IU] | Freq: Once | INTRAVENOUS | Status: AC
  Administered 2023-02-01: 500 [IU] via INTRAVENOUS

## 2023-02-01 MED ORDER — HEPARIN SOD (PORK) LOCK FLUSH 100 UNIT/ML IV SOLN
INTRAVENOUS | Status: AC
  Filled 2023-02-01: qty 5

## 2023-02-01 MED ORDER — IOHEXOL 300 MG/ML  SOLN
100.0000 mL | Freq: Once | INTRAMUSCULAR | Status: AC | PRN
  Administered 2023-02-01: 100 mL via INTRAVENOUS

## 2023-02-01 MED ORDER — SODIUM CHLORIDE 0.9% FLUSH
10.0000 mL | Freq: Once | INTRAVENOUS | Status: AC
  Administered 2023-02-01: 10 mL

## 2023-02-01 MED ORDER — SODIUM CHLORIDE (PF) 0.9 % IJ SOLN
INTRAMUSCULAR | Status: AC
  Filled 2023-02-01: qty 50

## 2023-02-01 MED ORDER — IOHEXOL 9 MG/ML PO SOLN
ORAL | Status: AC
  Filled 2023-02-01: qty 1000

## 2023-02-03 ENCOUNTER — Encounter: Payer: Self-pay | Admitting: Hematology and Oncology

## 2023-02-03 ENCOUNTER — Other Ambulatory Visit: Payer: Self-pay

## 2023-02-03 ENCOUNTER — Inpatient Hospital Stay (HOSPITAL_BASED_OUTPATIENT_CLINIC_OR_DEPARTMENT_OTHER): Payer: BC Managed Care – PPO | Admitting: Hematology and Oncology

## 2023-02-03 VITALS — BP 107/65 | HR 109 | Temp 97.7°F | Resp 18 | Ht 63.0 in | Wt 160.8 lb

## 2023-02-03 DIAGNOSIS — Z1501 Genetic susceptibility to malignant neoplasm of breast: Secondary | ICD-10-CM

## 2023-02-03 DIAGNOSIS — D6481 Anemia due to antineoplastic chemotherapy: Secondary | ICD-10-CM

## 2023-02-03 DIAGNOSIS — I429 Cardiomyopathy, unspecified: Secondary | ICD-10-CM | POA: Diagnosis not present

## 2023-02-03 DIAGNOSIS — I2510 Atherosclerotic heart disease of native coronary artery without angina pectoris: Secondary | ICD-10-CM | POA: Diagnosis not present

## 2023-02-03 DIAGNOSIS — Z90722 Acquired absence of ovaries, bilateral: Secondary | ICD-10-CM | POA: Diagnosis not present

## 2023-02-03 DIAGNOSIS — Z5111 Encounter for antineoplastic chemotherapy: Secondary | ICD-10-CM | POA: Diagnosis not present

## 2023-02-03 DIAGNOSIS — Z1509 Genetic susceptibility to other malignant neoplasm: Secondary | ICD-10-CM | POA: Diagnosis not present

## 2023-02-03 DIAGNOSIS — K5730 Diverticulosis of large intestine without perforation or abscess without bleeding: Secondary | ICD-10-CM | POA: Diagnosis not present

## 2023-02-03 DIAGNOSIS — T451X5A Adverse effect of antineoplastic and immunosuppressive drugs, initial encounter: Secondary | ICD-10-CM

## 2023-02-03 DIAGNOSIS — C482 Malignant neoplasm of peritoneum, unspecified: Secondary | ICD-10-CM | POA: Diagnosis not present

## 2023-02-03 DIAGNOSIS — Z79899 Other long term (current) drug therapy: Secondary | ICD-10-CM | POA: Diagnosis not present

## 2023-02-03 DIAGNOSIS — Z9081 Acquired absence of spleen: Secondary | ICD-10-CM

## 2023-02-03 DIAGNOSIS — R7989 Other specified abnormal findings of blood chemistry: Secondary | ICD-10-CM | POA: Diagnosis not present

## 2023-02-03 DIAGNOSIS — I700 Atherosclerosis of aorta: Secondary | ICD-10-CM | POA: Diagnosis not present

## 2023-02-03 LAB — CA 125: Cancer Antigen (CA) 125: 7.8 U/mL (ref 0.0–38.1)

## 2023-02-03 NOTE — Assessment & Plan Note (Signed)
This is the cause of her elevated platelet count

## 2023-02-03 NOTE — Progress Notes (Signed)
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[facility]) I have reviewed multiple imaging studies with the patient and her husband She has complete response to treatment There are subtle changes near the pancreas that could represent a different pathology versus benign findings We discussed risk and benefits of ordering additional imaging modality such as MRI of the pancreas versus waiting for next imaging study to be done around October of this year She is undecided We discussed the risk and benefits of olaparib The patient is concerned of side effects Ultimately, she is in agreement to start treatment on April 22 I will see her back a week later for further follow-up and toxicity review We discussed future follow-up  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 We discussed the role of olaparib for cancer prevention to reduce risk of recurrence of primary peritoneal carcinomatosis She is in agreement to proceed  Cardiomyopathy [facility]) She is known to have history of cardiomyopathy We discussed risk factor modification and close monitoring and follow-up with cardiologist  Anemia due to antineoplastic chemotherapy Her anemia is improving She has some mild fatigue We agreed to start treatment on April 22 to allow further time for recovery  No orders of the defined types were placed in this encounter.   All questions were answered. The patient knows to call the clinic with any problems, questions or concerns. The total time spent in the appointment was 40 minutes encounter with patients including review of chart and various tests results, discussions about  plan of care and coordination of care plan   Artis Delay, MD 02/03/2023 10:50 AM  INTERVAL HISTORY: Please see below for problem oriented charting. she returns for treatment follow-up and review test results Her energy level is gradually improving We spent majority of our time reviewing blood work as well as CT imaging results We discussed risk and benefits of ordering additional imaging to look at the pancreatic lesion seen on CT imaging We discussed also future follow-up and what to expect  REVIEW OF SYSTEMS:   Constitutional: Denies fevers, chills or abnormal weight loss Eyes: Denies blurriness of vision Ears, nose, mouth, throat, and face: Denies mucositis or sore throat Respiratory: Denies cough, dyspnea or wheezes Cardiovascular: Denies palpitation, chest discomfort or lower extremity swelling Gastrointestinal:  Denies nausea, heartburn or change in bowel habits Skin: Denies abnormal skin rashes Lymphatics: Denies new lymphadenopathy or easy bruising Neurological:Denies numbness, tingling or new weaknesses Behavioral/Psych: Mood is stable, no new changes  All other systems were reviewed with the patient and are negative.  I have reviewed the past medical history, past surgical history, social history and family history with the patient and they are unchanged from previous note.  ALLERGIES:  has No Known Allergies.  MEDICATIONS:  Current Outpatient Medications  Medication Sig Dispense Refill   Cholecalciferol (DIALYVITE VITAMIN D 5000) 125 MCG (5000 UT) capsule Take 5,000 Units by mouth every evening.     dexamethasone (DECADRON) 4 MG tablet Take 4 mg by mouth every 21 ( twenty-one) days. Take 2 tabs at the night before and 2 tab the morning of chemotherapy, every 3 weeks, by mouth x 6 cycles     levothyroxine (SYNTHROID) 125 MCG tablet Take  125 mcg by mouth daily before breakfast.     losartan (COZAAR) 25 MG tablet Take 0.5 tablets (12.5 mg total) by mouth at bedtime. 45  tablet 3   metoprolol succinate (TOPROL XL) 25 MG 24 hr tablet Take 0.5 tablets (12.5 mg total) by mouth at bedtime. 45 tablet 3   olaparib (LYNPARZA) 100 MG tablet Take 2 tablets (200 mg total) by mouth 2 (two) times daily. Swallow whole. May take with food to decrease nausea and vomiting. 120 tablet 0   pantoprazole (PROTONIX) 40 MG tablet Take 40 mg by mouth daily before breakfast.     rosuvastatin (CRESTOR) 5 MG tablet Take 1 tablet (5 mg total) by mouth daily. 90 tablet 3   senna-docusate (SENOKOT-S) 8.6-50 MG tablet Take 2 tablets by mouth at bedtime. For AFTER surgery, do not take if having diarrhea 30 tablet 0   vitamin B-12 (CYANOCOBALAMIN) 500 MCG tablet Take 500 mcg by mouth every evening.     No current facility-administered medications for this visit.    SUMMARY OF ONCOLOGIC HISTORY: Oncology History Overview Note  BRCA2 positive   Primary peritoneal adenocarcinoma  07/19/2022 Imaging   1. Small left pleural effusion  2. 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HCC)   07/23/2022 Cancer Staging   Staging form: Ovary, Fallopian Tube, and Primary Peritoneal Carcinoma, AJCC 8th Edition - Clinical stage from 07/23/2022: FIGO Stage IIIC (cT3c, cN0, cM0) - Signed by Artis Delay, MD on 07/23/2022 Stage prefix: Initial diagnosis   07/23/2022 Pathology Results   FINAL MICROSCOPIC DIAGNOSIS:  - Malignant cells present  - See comment   SPECIMEN ADEQUACY:  Satisfactory for evaluation   DIAGNOSTIC COMMENTS:  Immunohistochemical stains show that the tumor cells are positive for ER, PAX8, p16 and p53 (clonal overexpression pattern), consistent with a high-grade serous carcinoma.  Immunostain for WT1 is also diffusely positive in the tumor cells, most consistent with an ovarian primary.  Dr. Kenyon Ana reviewed the case and concurs with the above diagnosis.    07/30/2022 Procedure   Successful right  chest port placement via the right internal jugular vein. The port is ready for immediate use.   08/02/2022 - 12/28/2022 Chemotherapy   Patient is on Treatment Plan : OVARIAN Carboplatin (AUC 6) + Paclitaxel (175) q21d X 6 Cycles     08/03/2022 Procedure   Successful ultrasound-guided paracentesis yielding 4.1 liters of peritoneal fluid.   08/09/2022 Tumor Marker   Patient's tumor was tested for the following markers: CA-125. Results of the tumor marker test revealed 452.   08/12/2022 Echocardiogram    1. Left ventricular ejection fraction, by estimation, is 40 to 45%. The left ventricle has mildly decreased function. The left ventricle demonstrates global hypokinesis. Left ventricular diastolic parameters are indeterminate.   2. Right ventricular systolic function is normal. The right ventricular size is normal.   3. The mitral valve is normal in structure. Mild mitral valve regurgitation. No evidence of mitral stenosis.   4. The aortic valve is normal in structure. Aortic valve regurgitation is mild to moderate. Aortic valve sclerosis/calcification is present, without any evidence of aortic stenosis.   5. The inferior vena cava is normal in size with greater than 50% respiratory variability, suggesting right atrial pressure of 3 mmHg.    09/23/2022 Tumor Marker   Patient's tumor was tested for the following markers: CA-125. Results of the tumor marker test revealed 41.3.   10/07/2022 Imaging   1. Compared  to the outside abdominopelvic CT of 07/19/2022, significant improvement in peritoneal carcinomatosis, without bowel obstruction or other acute complication. 2. Decreased size of upper abdominal nodes, possibly also representing response to therapy. 3. No new or progressive disease and no evidence of thoracic metastasis. 4. Bladder wall thickening and mucosal hyperenhancement are suspicious for cystitis. 5. 4 mm left upper lobe pulmonary nodule is most likely benign/incidental but can be  re-evaluated at follow-up. 6. Aortic valvular calcifications. Consider echocardiography to evaluate for valvular dysfunction. 7. Coronary artery atherosclerosis. Aortic Atherosclerosis (ICD10-I70.0).     11/11/2022 Surgery   Exploratory laparotomy, lysis of adhesions for approximately 45 minutes, total omentectomy with radical tumor debulking including resection of perigastric nodule, excision and fulguration of multiple small bowel mesenteric nodules, resection of multiple sigmoid epiploica, fulguration of peritoneal lesions along bilateral pelvic sidewalls, peritoneal stripping of bladder peritoneum   Findings: On bimanual exam, no masses appreciated at the vaginal cuff, no nodularity.  On intra-abdominal entry, infracolic omentum retracted and involved by residual tumor implant measuring approximately 10 x 4 cm.  Nodularity palpated almost up to the splenic flexure.  Supracolic omentum involved to within 2 cm of the greater curvature of the stomach.  Additional 2 x 3 cm implant noted lateral and inferior to the gastric pylorus (sitting anterior to the duodenum and just inferior to the gallbladder).  Ascending and transverse colon normal in appearance.  Small bowel run from the ileocecal valve to the ligament of Treitz.  Rare small nodules within the mesentery noted.  These were all either excised or fulgurated.  Right aspect of the liver and diaphragm smooth to palpation, no visible lesions.  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  No ascites.  Within the pelvis, minimal nodularity seen along bilateral pelvic sidewalls near the infundibulopelvic remnants, suspected to be related to prior hysterectomy and BSO.  Several sigmoid epiploica with tumor implants versus treated tumor, all excised fulgurated.  No nodularity within the deep pelvis although some evidence of what appeared to be treated tumor versus tumor rind involving the bladder  peritoneum was stripped in this location.  Biopsies taken from the posterior cul-de-sac. R0 resection at the end of surgery.   11/11/2022 Pathology Results   A. SMALL BOWEL MESENTARY NODULES: -  Densely fibrotic soft tissue/scar with abundant foamy histiocytes, negative for malignancy.  B. 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  C. PERIGASTRIC IMPLANTS, EXCISION: -  High-grade serous carcinoma.  D. SIGMOID EPIPLOICA, EXCISION: High-grade serous carcinoma in the background of dense stromal fibrosis/scar, calcifications and foamy histiocytes.  E. BLADDER PERITONEUM, EXCISION: -  High-grade serous carcinoma.  F. CUL DE SAC, POSTERIOR, BIOPSY: -  Focal high-grade serous carcinoma in the background of predominantly broad and foamy histiocytes.  Note: It is noted that the patient had a diagnosis of high-grade serous carcinoma from a cytology of the ascites in September 2023 and was clinically staged as cT3c.  Patient is now status post neoadjuvant chemotherapy.  It is noted that this is a debulking surgery.  No additional testing is performed to reserve tissue for clinician directed testing.    12/08/2022 Tumor Marker   Patient's tumor was tested for the following markers: CA-125. Results of the tumor marker test revealed 10.2.   02/03/2023 Imaging   1. Compared to 10/06/2022, interval omentectomy and peritoneal stripping. No  residual omental thickening/nodularity. Scattered areas of mesenteric stranding may be postsurgical. 2. No new metastatic disease in the abdomen or pelvis. 3. Unchanged 5 mm enhancing focus along the anterior pancreatic body/tail compared to 07/19/2022. Differential includes intrapancreatic spleen or neoplasm, including small neuroendocrine tumor. Given the size of this  lesion, evaluation by MRI would likely be suboptimal. 4. Aortic Atherosclerosis (ICD10-I70.0). Coronary artery calcifications. Assessment for potential risk factor modification, dietary therapy or pharmacologic therapy may be warranted, if clinically indicated.   02/03/2023 Tumor Marker   Patient's tumor was tested for the following markers: CA-125. Results of the tumor marker test revealed 7.8.     PHYSICAL EXAMINATION: ECOG PERFORMANCE STATUS: 1 - Symptomatic but completely ambulatory  Vitals:   02/03/23 0854  BP: 107/65  Pulse: (!) 109  Resp: 18  Temp: 97.7 F (36.5 C)  SpO2: 100%   Filed Weights   02/03/23 0854  Weight: 160 lb 12.8 oz (72.9 kg)    GENERAL:alert, no distress and comfortable  NEURO: alert & oriented x 3 with fluent speech, no focal motor/sensory deficits  LABORATORY DATA:  I have reviewed the data as listed    Component Value Date/Time   NA 138 02/01/2023 0915   K 4.2 02/01/2023 0915   CL 104 02/01/2023 0915   CO2 28 02/01/2023 0915   GLUCOSE 100 (H) 02/01/2023 0915   BUN 26 (H) 02/01/2023 0915   CREATININE 0.93 02/01/2023 0915   CREATININE 0.94 12/28/2022 0900   CALCIUM 9.7 02/01/2023 0915   PROT 8.2 (H) 02/01/2023 0915   PROT 7.2 12/06/2022 1119   ALBUMIN 3.9 02/01/2023 0915   ALBUMIN 3.9 12/06/2022 1119   AST 16 02/01/2023 0915   AST 18 12/28/2022 0900   ALT 13 02/01/2023 0915   ALT 18 12/28/2022 0900   ALKPHOS 78 02/01/2023 0915   BILITOT 0.2 (L) 02/01/2023 0915   BILITOT 0.2 (L) 12/28/2022 0900   GFRNONAA >60 02/01/2023 0915   GFRNONAA >60 12/28/2022 0900    No results found for: "SPEP", "UPEP"  Lab Results  Component Value Date   WBC 6.4 02/01/2023   NEUTROABS 2.6 02/01/2023   HGB 10.4 (L) 02/01/2023   HCT 31.2 (L) 02/01/2023   MCV 100.6 (H) 02/01/2023   PLT 451 (H) 02/01/2023      Chemistry      Component Value Date/Time   NA 138 02/01/2023 0915   K 4.2 02/01/2023 0915   CL 104 02/01/2023 0915   CO2 28 02/01/2023  0915   BUN 26 (H) 02/01/2023 0915   CREATININE 0.93 02/01/2023 0915   CREATININE 0.94 12/28/2022 0900      Component Value Date/Time   CALCIUM 9.7 02/01/2023 0915   ALKPHOS 78 02/01/2023 0915   AST 16 02/01/2023 0915   AST 18 12/28/2022 0900   ALT 13 02/01/2023 0915   ALT 18 12/28/2022 0900   BILITOT 0.2 (L) 02/01/2023 0915   BILITOT 0.2 (L) 12/28/2022 0900       RADIOGRAPHIC STUDIES: I have reviewed multiple imaging studies with the patient and her husband I have personally reviewed the radiological images as listed and agreed with the findings in the report. CT ABDOMEN PELVIS W CONTRAST  Result Date: 02/02/2023 CLINICAL DATA:  History of high-grade serous carcinoma with primary peritoneal adenocarcinoma status post omentectomy and peritoneal stripping on 11/11/2022, now completed treatment. * Tracking Code: BO * EXAM: CT ABDOMEN AND PELVIS WITH CONTRAST TECHNIQUE: Multidetector CT imaging of the abdomen and pelvis was performed using the standard  protocol following bolus administration of intravenous contrast. RADIATION DOSE REDUCTION: This exam was performed according to the departmental dose-optimization program which includes automated exposure control, adjustment of the mA and/or kV according to patient size and/or use of iterative reconstruction technique. CONTRAST:  OMNIPAQUE IOHEXOL 300 MG/ML  SOLN COMPARISON:  CT abdomen and pelvis dated 10/06/2022, 07/19/2022 FINDINGS: Lower chest: No focal consolidation or pulmonary nodule in the lung bases. No pleural effusion or pneumothorax demonstrated. Partially imaged heart size is normal. Coronary artery calcifications. Hepatobiliary: No focal hepatic lesions. No intra or extrahepatic biliary ductal dilation. Normal gallbladder. Pancreas: Unchanged 5 mm enhancing focus along the anterior pancreatic body/tail (2:19) compared to 07/19/2022. Spleen: Splenectomy. Adrenals/Urinary Tract: No adrenal nodules. No suspicious renal mass, calculi  or hydronephrosis. No focal bladder wall thickening. Stomach/Bowel: Normal appearance of the stomach. No evidence of bowel wall thickening, distention, or inflammatory changes. Colonic diverticulosis without acute diverticulitis. Normal appendix. Vascular/Lymphatic: Aortic atherosclerosis. No enlarged abdominal or pelvic lymph nodes. Reproductive: Status post hysterectomy and bilateral salpingo-oophorectomy. Other: Status post omentectomy. No residual omental thickening/nodularity. Minimal mesenteric stranding along the left anterior and lateral peritoneum be postsurgical. Unchanged ill-defined stranding is also noted in the periduodenal region (2:23). Improved right pelvic peritoneal thickening (2:69). No free fluid, fluid collection, or free air. Musculoskeletal: No acute or abnormal lytic or blastic osseous lesions. Postsurgical changes of the anterior abdominal wall. IMPRESSION: 1. Compared to 10/06/2022, interval omentectomy and peritoneal stripping. No residual omental thickening/nodularity. Scattered areas of mesenteric stranding may be postsurgical. 2. No new metastatic disease in the abdomen or pelvis. 3. Unchanged 5 mm enhancing focus along the anterior pancreatic body/tail compared to 07/19/2022. Differential includes intrapancreatic spleen or neoplasm, including small neuroendocrine tumor. Given the size of this lesion, evaluation by MRI would likely be suboptimal. 4. Aortic Atherosclerosis (ICD10-I70.0). Coronary artery calcifications. Assessment for potential risk factor modification, dietary therapy or pharmacologic therapy may be warranted, if clinically indicated. Electronically Signed   By: Agustin Cree M.D.   On: 02/02/2023 15:33

## 2023-02-03 NOTE — Assessment & Plan Note (Signed)
She is known to have history of cardiomyopathy We discussed risk factor modification and close monitoring and follow-up with cardiologist

## 2023-02-03 NOTE — Assessment & Plan Note (Signed)
The patient has bilateral mastectomy. We discussed the role of olaparib for cancer prevention to reduce risk of recurrence of primary peritoneal carcinomatosis She is in agreement to proceed

## 2023-02-03 NOTE — Assessment & Plan Note (Signed)
Her anemia is improving She has some mild fatigue We agreed to start treatment on April 22 to allow further time for recovery

## 2023-02-03 NOTE — Assessment & Plan Note (Signed)
I have reviewed multiple imaging studies with the patient and her husband She has complete response to treatment There are subtle changes near the pancreas that could represent a different pathology versus benign findings We discussed risk and benefits of ordering additional imaging modality such as MRI of the pancreas versus waiting for next imaging study to be done around October of this year She is undecided We discussed the risk and benefits of olaparib The patient is concerned of side effects Ultimately, she is in agreement to start treatment on April 22 I will see her back a week later for further follow-up and toxicity review We discussed future follow-up

## 2023-02-09 ENCOUNTER — Ambulatory Visit (HOSPITAL_COMMUNITY): Payer: BC Managed Care – PPO

## 2023-02-22 ENCOUNTER — Inpatient Hospital Stay (HOSPITAL_BASED_OUTPATIENT_CLINIC_OR_DEPARTMENT_OTHER): Payer: BC Managed Care – PPO | Admitting: Hematology and Oncology

## 2023-02-22 ENCOUNTER — Inpatient Hospital Stay: Payer: BC Managed Care – PPO

## 2023-02-22 ENCOUNTER — Other Ambulatory Visit: Payer: Self-pay | Admitting: Hematology and Oncology

## 2023-02-22 ENCOUNTER — Telehealth: Payer: Self-pay

## 2023-02-22 ENCOUNTER — Encounter: Payer: Self-pay | Admitting: Hematology and Oncology

## 2023-02-22 VITALS — BP 124/70 | HR 103 | Temp 97.6°F | Resp 18 | Ht 63.0 in | Wt 161.0 lb

## 2023-02-22 DIAGNOSIS — I2510 Atherosclerotic heart disease of native coronary artery without angina pectoris: Secondary | ICD-10-CM | POA: Diagnosis not present

## 2023-02-22 DIAGNOSIS — Z5111 Encounter for antineoplastic chemotherapy: Secondary | ICD-10-CM | POA: Diagnosis not present

## 2023-02-22 DIAGNOSIS — C482 Malignant neoplasm of peritoneum, unspecified: Secondary | ICD-10-CM | POA: Diagnosis not present

## 2023-02-22 DIAGNOSIS — Z9081 Acquired absence of spleen: Secondary | ICD-10-CM | POA: Diagnosis not present

## 2023-02-22 DIAGNOSIS — R7989 Other specified abnormal findings of blood chemistry: Secondary | ICD-10-CM | POA: Insufficient documentation

## 2023-02-22 DIAGNOSIS — D6481 Anemia due to antineoplastic chemotherapy: Secondary | ICD-10-CM

## 2023-02-22 DIAGNOSIS — Z1509 Genetic susceptibility to other malignant neoplasm: Secondary | ICD-10-CM | POA: Diagnosis not present

## 2023-02-22 DIAGNOSIS — K5730 Diverticulosis of large intestine without perforation or abscess without bleeding: Secondary | ICD-10-CM | POA: Diagnosis not present

## 2023-02-22 DIAGNOSIS — Z90722 Acquired absence of ovaries, bilateral: Secondary | ICD-10-CM | POA: Diagnosis not present

## 2023-02-22 DIAGNOSIS — I429 Cardiomyopathy, unspecified: Secondary | ICD-10-CM | POA: Diagnosis not present

## 2023-02-22 DIAGNOSIS — T451X5A Adverse effect of antineoplastic and immunosuppressive drugs, initial encounter: Secondary | ICD-10-CM

## 2023-02-22 DIAGNOSIS — Z1501 Genetic susceptibility to malignant neoplasm of breast: Secondary | ICD-10-CM | POA: Diagnosis not present

## 2023-02-22 DIAGNOSIS — Z79899 Other long term (current) drug therapy: Secondary | ICD-10-CM | POA: Diagnosis not present

## 2023-02-22 DIAGNOSIS — I700 Atherosclerosis of aorta: Secondary | ICD-10-CM | POA: Diagnosis not present

## 2023-02-22 LAB — CBC WITH DIFFERENTIAL/PLATELET
Abs Immature Granulocytes: 0.04 10*3/uL (ref 0.00–0.07)
Basophils Absolute: 0.1 10*3/uL (ref 0.0–0.1)
Basophils Relative: 1 %
Eosinophils Absolute: 0.4 10*3/uL (ref 0.0–0.5)
Eosinophils Relative: 6 %
HCT: 31.1 % — ABNORMAL LOW (ref 36.0–46.0)
Hemoglobin: 10.5 g/dL — ABNORMAL LOW (ref 12.0–15.0)
Immature Granulocytes: 1 %
Lymphocytes Relative: 39 %
Lymphs Abs: 2.3 10*3/uL (ref 0.7–4.0)
MCH: 32.8 pg (ref 26.0–34.0)
MCHC: 33.8 g/dL (ref 30.0–36.0)
MCV: 97.2 fL (ref 80.0–100.0)
Monocytes Absolute: 0.8 10*3/uL (ref 0.1–1.0)
Monocytes Relative: 13 %
Neutro Abs: 2.4 10*3/uL (ref 1.7–7.7)
Neutrophils Relative %: 40 %
Platelets: 358 10*3/uL (ref 150–400)
RBC: 3.2 MIL/uL — ABNORMAL LOW (ref 3.87–5.11)
RDW: 15 % (ref 11.5–15.5)
Smear Review: NORMAL
WBC: 5.9 10*3/uL (ref 4.0–10.5)
nRBC: 6.4 % — ABNORMAL HIGH (ref 0.0–0.2)

## 2023-02-22 LAB — COMPREHENSIVE METABOLIC PANEL
ALT: 15 U/L (ref 0–44)
AST: 21 U/L (ref 15–41)
Albumin: 3.8 g/dL (ref 3.5–5.0)
Alkaline Phosphatase: 66 U/L (ref 38–126)
Anion gap: 7 (ref 5–15)
BUN: 27 mg/dL — ABNORMAL HIGH (ref 6–20)
CO2: 29 mmol/L (ref 22–32)
Calcium: 9.5 mg/dL (ref 8.9–10.3)
Chloride: 103 mmol/L (ref 98–111)
Creatinine, Ser: 1.32 mg/dL — ABNORMAL HIGH (ref 0.44–1.00)
GFR, Estimated: 47 mL/min — ABNORMAL LOW (ref >60)
Glucose, Bld: 110 mg/dL — ABNORMAL HIGH (ref 70–99)
Potassium: 3.8 mmol/L (ref 3.5–5.1)
Sodium: 139 mmol/L (ref 135–145)
Total Bilirubin: 0.2 mg/dL — ABNORMAL LOW (ref 0.3–1.2)
Total Protein: 7.5 g/dL (ref 6.5–8.1)

## 2023-02-22 MED ORDER — HEPARIN SOD (PORK) LOCK FLUSH 100 UNIT/ML IV SOLN
500.0000 [IU] | Freq: Once | INTRAVENOUS | Status: AC | PRN
  Administered 2023-02-22: 500 [IU]

## 2023-02-22 MED ORDER — SODIUM CHLORIDE 0.9% FLUSH
10.0000 mL | Freq: Once | INTRAVENOUS | Status: AC | PRN
  Administered 2023-02-22: 10 mL

## 2023-02-22 NOTE — Telephone Encounter (Signed)
-----   Message from Artis Delay, MD sent at 02/22/2023 10:10 AM EDT ----- Hi,  Can you call her? CMP came back borderline abnormal with elevated creatinine Advise her to drink more water prior to next appt

## 2023-02-22 NOTE — Telephone Encounter (Signed)
Called pt to advise MD instructions. She verbalized thanks and understanding.

## 2023-02-22 NOTE — Assessment & Plan Note (Signed)
Overall, she tolerated Lynparza well without major side effects except for mild achiness We will proceed with treatment without delay Plan to see her again in a few weeks for further follow-up

## 2023-02-22 NOTE — Assessment & Plan Note (Signed)
Her creatinine is elevated Recommend the patient to increase fluid hydration

## 2023-02-22 NOTE — Assessment & Plan Note (Signed)
Her anemia is stable Monitor closely

## 2023-02-22 NOTE — Progress Notes (Signed)
[facility] OFFICE PROGRESS NOTE  Patient Care Team: [name], MD as PCP - General (Internal Medicine) [name], MD as PCP - Cardiology (Cardiology) Charna Elizabeth, MD as Consulting Physician (Gastroenterology)  ASSESSMENT & PLAN:  Primary peritoneal adenocarcinoma (HCC) Overall, she tolerated Garey Ham well without major side effects except for mild achiness We will proceed with treatment without delay Plan to see her again in a few weeks for further follow-up  Anemia due to antineoplastic chemotherapy Her anemia is stable Monitor closely  Elevated serum creatinine Her creatinine is elevated Recommend the patient to increase fluid hydration  No orders of the defined types were placed in this encounter.   All questions were answered. The patient knows to call the clinic with any problems, questions or concerns. The total time spent in the appointment was 20 minutes encounter with patients including review of chart and various tests results, discussions about plan of care and coordination of care plan   Artis Delay, MD 02/22/2023 2:41 PM  INTERVAL HISTORY: Please see below for problem oriented charting. she returns for treatment follow-up on Lynparza She tolerated treatment well No recent bleeding or infection Denies nausea  REVIEW OF SYSTEMS:   Constitutional: Denies fevers, chills or abnormal weight loss Eyes: Denies blurriness of vision Ears, nose, mouth, throat, and face: Denies mucositis or sore throat Respiratory: Denies cough, dyspnea or wheezes Cardiovascular: Denies palpitation, chest discomfort or lower extremity swelling Gastrointestinal:  Denies nausea, heartburn or change in bowel habits Skin: Denies abnormal skin rashes Lymphatics: Denies new lymphadenopathy or easy bruising Neurological:Denies numbness, tingling or new weaknesses Behavioral/Psych: Mood is stable, no new changes  All other systems were reviewed with the patient and  are negative.  I have reviewed the past medical history, past surgical history, social history and family history with the patient and they are unchanged from previous note.  ALLERGIES:  has No Known Allergies.  MEDICATIONS:  Current Outpatient Medications  Medication Sig Dispense Refill   Cholecalciferol (DIALYVITE VITAMIN D 5000) 125 MCG (5000 UT) capsule Take 5,000 Units by mouth every evening.     levothyroxine (SYNTHROID) 125 MCG tablet Take 125 mcg by mouth daily before breakfast.     olaparib (LYNPARZA) 100 MG tablet Take 2 tablets (200 mg total) by mouth 2 (two) times daily. Swallow whole. May take with food to decrease nausea and vomiting. 120 tablet 0   pantoprazole (PROTONIX) 40 MG tablet Take 40 mg by mouth daily before breakfast.     rosuvastatin (CRESTOR) 5 MG tablet Take 1 tablet (5 mg total) by mouth daily. 90 tablet 3   vitamin B-12 (CYANOCOBALAMIN) 500 MCG tablet Take 500 mcg by mouth every evening.     No current facility-administered medications for this visit.    SUMMARY OF ONCOLOGIC HISTORY: Oncology History Overview Note  BRCA2 positive   Primary peritoneal adenocarcinoma (HCC)  07/19/2022 Imaging   1. Small left pleural effusion  2. 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HCC)   07/23/2022 Cancer Staging   Staging form: Ovary, Fallopian Tube, and Primary Peritoneal Carcinoma, AJCC 8th Edition - Clinical stage from 07/23/2022: FIGO Stage IIIC (cT3c, cN0, cM0) - Signed by Artis Delay, MD on 07/23/2022 Stage prefix: Initial diagnosis   07/23/2022 Pathology Results   FINAL MICROSCOPIC DIAGNOSIS:  - Malignant cells present  - See comment   SPECIMEN ADEQUACY:  Satisfactory for evaluation   DIAGNOSTIC COMMENTS:  Immunohistochemical stains show  that the tumor cells are positive for ER, PAX8, p16 and p53 (clonal overexpression pattern), consistent with a high-grade serous  carcinoma.  Immunostain for WT1 is also diffusely positive in the tumor cells, most consistent with an ovarian primary.  Dr. Kenyon Ana reviewed the case and concurs with the above diagnosis.    07/30/2022 Procedure   Successful right chest port placement via the right internal jugular vein. The port is ready for immediate use.   08/02/2022 - 12/28/2022 Chemotherapy   Patient is on Treatment Plan : OVARIAN Carboplatin (AUC 6) + Paclitaxel (175) q21d X 6 Cycles     08/03/2022 Procedure   Successful ultrasound-guided paracentesis yielding 4.1 liters of peritoneal fluid.   08/09/2022 Tumor Marker   Patient's tumor was tested for the following markers: CA-125. Results of the tumor marker test revealed 452.   08/12/2022 Echocardiogram    1. Left ventricular ejection fraction, by estimation, is 40 to 45%. The left ventricle has mildly decreased function. The left ventricle demonstrates global hypokinesis. Left ventricular diastolic parameters are indeterminate.   2. Right ventricular systolic function is normal. The right ventricular size is normal.   3. The mitral valve is normal in structure. Mild mitral valve regurgitation. No evidence of mitral stenosis.   4. The aortic valve is normal in structure. Aortic valve regurgitation is mild to moderate. Aortic valve sclerosis/calcification is present, without any evidence of aortic stenosis.   5. The inferior vena cava is normal in size with greater than 50% respiratory variability, suggesting right atrial pressure of 3 mmHg.    09/23/2022 Tumor Marker   Patient's tumor was tested for the following markers: CA-125. Results of the tumor marker test revealed 41.3.   10/07/2022 Imaging   1. Compared to the outside abdominopelvic CT of 07/19/2022, significant improvement in peritoneal carcinomatosis, without bowel obstruction or other acute complication. 2. Decreased size of upper abdominal nodes, possibly also representing response to therapy. 3. No new  or progressive disease and no evidence of thoracic metastasis. 4. Bladder wall thickening and mucosal hyperenhancement are suspicious for cystitis. 5. 4 mm left upper lobe pulmonary nodule is most likely benign/incidental but can be re-evaluated at follow-up. 6. Aortic valvular calcifications. Consider echocardiography to evaluate for valvular dysfunction. 7. Coronary artery atherosclerosis. Aortic Atherosclerosis (ICD10-I70.0).     11/11/2022 Surgery   Exploratory laparotomy, lysis of adhesions for approximately 45 minutes, total omentectomy with radical tumor debulking including resection of perigastric nodule, excision and fulguration of multiple small bowel mesenteric nodules, resection of multiple sigmoid epiploica, fulguration of peritoneal lesions along bilateral pelvic sidewalls, peritoneal stripping of bladder peritoneum   Findings: On bimanual exam, no masses appreciated at the vaginal cuff, no nodularity.  On intra-abdominal entry, infracolic omentum retracted and involved by residual tumor implant measuring approximately 10 x 4 cm.  Nodularity palpated almost up to the splenic flexure.  Supracolic omentum involved to within 2 cm of the greater curvature of the stomach.  Additional 2 x 3 cm implant noted lateral and inferior to the gastric pylorus (sitting anterior to the duodenum and just inferior to the gallbladder).  Ascending and transverse colon normal in appearance.  Small bowel run from the ileocecal valve to the ligament of Treitz.  Rare small nodules within the mesentery noted.  These were all either excised or fulgurated.  Right aspect of the liver and diaphragm smooth to palpation, no visible lesions.  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  No ascites.  Within the pelvis, minimal nodularity seen along bilateral pelvic sidewalls near the infundibulopelvic remnants, suspected to be related to prior hysterectomy and BSO.   Several sigmoid epiploica with tumor implants versus treated tumor, all excised fulgurated.  No nodularity within the deep pelvis although some evidence of what appeared to be treated tumor versus tumor rind involving the bladder peritoneum was stripped in this location.  Biopsies taken from the posterior cul-de-sac. R0 resection at the end of surgery.   11/11/2022 Pathology Results   A. SMALL BOWEL MESENTARY NODULES: -  Densely fibrotic soft tissue/scar with abundant foamy histiocytes, negative for malignancy.  B. 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  C. PERIGASTRIC IMPLANTS, EXCISION: -  High-grade serous carcinoma.  D. SIGMOID EPIPLOICA, EXCISION: High-grade serous carcinoma in the background of dense stromal fibrosis/scar, calcifications and foamy histiocytes.  E. BLADDER PERITONEUM, EXCISION: -  High-grade serous carcinoma.  F. CUL DE SAC, POSTERIOR, BIOPSY: -  Focal high-grade serous carcinoma in the background of predominantly broad and foamy histiocytes.  Note: It is noted that the patient had a diagnosis of high-grade serous carcinoma from a cytology of the ascites in September 2023 and was clinically staged as cT3c.  Patient is now status post neoadjuvant chemotherapy.  It is noted that this is a debulking surgery.  No additional testing is performed to reserve tissue for clinician directed testing.    12/08/2022 Tumor Marker   Patient's tumor was tested for the following markers: CA-125. Results of the tumor marker test revealed 10.2.   02/03/2023 Imaging   1. Compared to 10/06/2022, interval omentectomy and peritoneal stripping. No residual omental thickening/nodularity. Scattered areas of mesenteric stranding may be postsurgical. 2. No new metastatic disease in the abdomen or  pelvis. 3. Unchanged 5 mm enhancing focus along the anterior pancreatic body/tail compared to 07/19/2022. Differential includes intrapancreatic spleen or neoplasm, including small neuroendocrine tumor. Given the size of this lesion, evaluation by MRI would likely be suboptimal. 4. Aortic Atherosclerosis (ICD10-I70.0). Coronary artery calcifications. Assessment for potential risk factor modification, dietary therapy or pharmacologic therapy may be warranted, if clinically indicated.   02/03/2023 Tumor Marker   Patient's tumor was tested for the following markers: CA-125. Results of the tumor marker test revealed 7.8.     PHYSICAL EXAMINATION: ECOG PERFORMANCE STATUS: 0 - Asymptomatic  Vitals:   02/22/23 0918  BP: 124/70  Pulse: (!) 103  Resp: 18  Temp: 97.6 F (36.4 C)  SpO2: 100%   Filed Weights   02/22/23 0918  Weight: 161 lb (73 kg)    GENERAL:alert, no distress and comfortable  NEURO: alert & oriented x 3 with fluent speech, no focal motor/sensory deficits  LABORATORY DATA:  I have reviewed the data as listed    Component Value Date/Time   NA 139 02/22/2023 0858   K 3.8 02/22/2023 0858   CL 103 02/22/2023 0858   CO2 29 02/22/2023 0858   GLUCOSE 110 (H) 02/22/2023 0858   BUN 27 (H) 02/22/2023 0858   CREATININE 1.32 (H) 02/22/2023 0858   CREATININE 0.94 12/28/2022 0900   CALCIUM 9.5 02/22/2023 0858   PROT 7.5 02/22/2023 0858   PROT 7.2 12/06/2022 1119   ALBUMIN 3.8 02/22/2023 0858   ALBUMIN 3.9 12/06/2022 1119   AST 21 02/22/2023 0858   AST 18 12/28/2022 0900   ALT 15 02/22/2023 0858   ALT 18 12/28/2022 0900  ALKPHOS 66 02/22/2023 0858   BILITOT 0.2 (L) 02/22/2023 0858   BILITOT 0.2 (L) 12/28/2022 0900   GFRNONAA 47 (L) 02/22/2023 0858   GFRNONAA >60 12/28/2022 0900    No results found for: "SPEP", "UPEP"  Lab Results  Component Value Date   WBC 5.9 02/22/2023   NEUTROABS 2.4 02/22/2023   HGB 10.5 (L) 02/22/2023   HCT 31.1 (L) 02/22/2023   MCV  97.2 02/22/2023   PLT 358 02/22/2023      Chemistry      Component Value Date/Time   NA 139 02/22/2023 0858   K 3.8 02/22/2023 0858   CL 103 02/22/2023 0858   CO2 29 02/22/2023 0858   BUN 27 (H) 02/22/2023 0858   CREATININE 1.32 (H) 02/22/2023 0858   CREATININE 0.94 12/28/2022 0900      Component Value Date/Time   CALCIUM 9.5 02/22/2023 0858   ALKPHOS 66 02/22/2023 0858   AST 21 02/22/2023 0858   AST 18 12/28/2022 0900   ALT 15 02/22/2023 0858   ALT 18 12/28/2022 0900   BILITOT 0.2 (L) 02/22/2023 0858   BILITOT 0.2 (L) 12/28/2022 0900

## 2023-02-25 ENCOUNTER — Ambulatory Visit: Payer: BC Managed Care – PPO | Attending: Internal Medicine

## 2023-02-25 DIAGNOSIS — E785 Hyperlipidemia, unspecified: Secondary | ICD-10-CM

## 2023-02-25 LAB — HEPATIC FUNCTION PANEL
ALT: 14 IU/L (ref 0–32)
AST: 23 IU/L (ref 0–40)
Albumin: 3.9 g/dL (ref 3.8–4.9)
Alkaline Phosphatase: 78 IU/L (ref 44–121)
Bilirubin Total: <0.2 mg/dL (ref 0.0–1.2)
Bilirubin, Direct: <0.1 mg/dL (ref 0.00–0.40)
Total Protein: 7.4 g/dL (ref 6.0–8.5)

## 2023-02-25 LAB — LIPID PANEL
Chol/HDL Ratio: 4 ratio (ref 0.0–4.4)
Cholesterol, Total: 203 mg/dL — ABNORMAL HIGH (ref 100–199)
HDL: 51 mg/dL (ref >39)
LDL Chol Calc (NIH): 121 mg/dL — ABNORMAL HIGH (ref 0–99)
Triglycerides: 174 mg/dL — ABNORMAL HIGH (ref 0–149)
VLDL Cholesterol Cal: 31 mg/dL (ref 5–40)

## 2023-02-25 NOTE — Telephone Encounter (Signed)
Oral Chemotherapy Pharmacist Encounter  I spoke with patient today for overview of: Lauren Chandler for the treatment of primary peritoneal adenocarcinoma with response to platinum-based chemotherapy, planned duration for 2 years or until disease progression or unacceptable toxicity.   Counseled patient on administration, dosing, side effects, monitoring, drug-food interactions, safe handling, storage, and disposal.  Patient will take Lynparza 100mg  tablets, 2 tablets (200mg ) by mouth 2 times daily without regard to food.  Patient counseled that administering with food may increase tolerability.  Patient knows to avoid grapefruit or grapefruit juice while on therapy with Lynparza.  Lauren Chandler start date: 02/14/23   Adverse effects include but are not limited to: nausea, vomiting, diarrhea, taste changes, mouth sores, fatigue, constipation, decreased blood counts, and joint pain. Patient informed that pneumonitis (including some fatalities) has occurred rarely. Myelodysplastic syndrome/acute myeloid leukemia (MDS/AML) have been reported (rarely) in clinical trials.  Patient has anti-emetic on hand and knows to take it if nausea develops.   Patient will obtain anti diarrheal and alert the office of 4 or more loose stools above baseline.  Reviewed with patient importance of keeping a medication schedule and plan for any missed doses. No barriers to medication adherence identified.  Medication reconciliation performed and medication/allergy list updated.  Insurance authorization for Lauren Chandler has been obtained. Patient fills medication at [facility].  Patient informed the pharmacy will reach out 5-7 days prior to needing next fill of Lynparza to coordinate continued medication acquisition to prevent break in therapy.  All questions answered.  Patient voiced understanding and appreciation.   Medication education handout placed in mail for patient. Patient knows to call the office  with questions or concerns. Oral Chemotherapy Clinic phone number provided to patient.   Bethel Born, PharmD Hematology/Oncology Clinical Pharmacist [facility] [phone]

## 2023-03-08 ENCOUNTER — Other Ambulatory Visit: Payer: Self-pay

## 2023-03-08 ENCOUNTER — Inpatient Hospital Stay (HOSPITAL_BASED_OUTPATIENT_CLINIC_OR_DEPARTMENT_OTHER): Payer: BC Managed Care – PPO | Admitting: Hematology and Oncology

## 2023-03-08 ENCOUNTER — Inpatient Hospital Stay: Payer: BC Managed Care – PPO | Attending: Gynecologic Oncology

## 2023-03-08 ENCOUNTER — Encounter: Payer: Self-pay | Admitting: Hematology and Oncology

## 2023-03-08 VITALS — BP 113/55 | HR 105 | Temp 97.6°F | Resp 18 | Ht 63.0 in | Wt 164.6 lb

## 2023-03-08 DIAGNOSIS — T451X5A Adverse effect of antineoplastic and immunosuppressive drugs, initial encounter: Secondary | ICD-10-CM | POA: Diagnosis not present

## 2023-03-08 DIAGNOSIS — Z90710 Acquired absence of both cervix and uterus: Secondary | ICD-10-CM | POA: Diagnosis not present

## 2023-03-08 DIAGNOSIS — Z800 Family history of malignant neoplasm of digestive organs: Secondary | ICD-10-CM | POA: Diagnosis not present

## 2023-03-08 DIAGNOSIS — Z1501 Genetic susceptibility to malignant neoplasm of breast: Secondary | ICD-10-CM | POA: Insufficient documentation

## 2023-03-08 DIAGNOSIS — R7989 Other specified abnormal findings of blood chemistry: Secondary | ICD-10-CM | POA: Insufficient documentation

## 2023-03-08 DIAGNOSIS — D6481 Anemia due to antineoplastic chemotherapy: Secondary | ICD-10-CM

## 2023-03-08 DIAGNOSIS — Z1509 Genetic susceptibility to other malignant neoplasm: Secondary | ICD-10-CM | POA: Diagnosis not present

## 2023-03-08 DIAGNOSIS — R1905 Periumbilic swelling, mass or lump: Secondary | ICD-10-CM | POA: Diagnosis not present

## 2023-03-08 DIAGNOSIS — Z79899 Other long term (current) drug therapy: Secondary | ICD-10-CM | POA: Diagnosis not present

## 2023-03-08 DIAGNOSIS — Z1502 Genetic susceptibility to malignant neoplasm of ovary: Secondary | ICD-10-CM | POA: Insufficient documentation

## 2023-03-08 DIAGNOSIS — C482 Malignant neoplasm of peritoneum, unspecified: Secondary | ICD-10-CM | POA: Insufficient documentation

## 2023-03-08 DIAGNOSIS — Z8042 Family history of malignant neoplasm of prostate: Secondary | ICD-10-CM | POA: Diagnosis not present

## 2023-03-08 DIAGNOSIS — Z803 Family history of malignant neoplasm of breast: Secondary | ICD-10-CM | POA: Diagnosis not present

## 2023-03-08 DIAGNOSIS — Z90722 Acquired absence of ovaries, bilateral: Secondary | ICD-10-CM | POA: Insufficient documentation

## 2023-03-08 LAB — COMPREHENSIVE METABOLIC PANEL
ALT: 13 U/L (ref 0–44)
AST: 16 U/L (ref 15–41)
Albumin: 3.9 g/dL (ref 3.5–5.0)
Alkaline Phosphatase: 65 U/L (ref 38–126)
Anion gap: 6 (ref 5–15)
BUN: 26 mg/dL — ABNORMAL HIGH (ref 6–20)
CO2: 29 mmol/L (ref 22–32)
Calcium: 9.2 mg/dL (ref 8.9–10.3)
Chloride: 104 mmol/L (ref 98–111)
Creatinine, Ser: 1.23 mg/dL — ABNORMAL HIGH (ref 0.44–1.00)
GFR, Estimated: 51 mL/min — ABNORMAL LOW (ref >60)
Glucose, Bld: 109 mg/dL — ABNORMAL HIGH (ref 70–99)
Potassium: 4.2 mmol/L (ref 3.5–5.1)
Sodium: 139 mmol/L (ref 135–145)
Total Bilirubin: 0.3 mg/dL (ref 0.3–1.2)
Total Protein: 7.7 g/dL (ref 6.5–8.1)

## 2023-03-08 LAB — CBC WITH DIFFERENTIAL/PLATELET
Abs Immature Granulocytes: 0.03 10*3/uL (ref 0.00–0.07)
Basophils Absolute: 0.1 10*3/uL (ref 0.0–0.1)
Basophils Relative: 1 %
Eosinophils Absolute: 0.4 10*3/uL (ref 0.0–0.5)
Eosinophils Relative: 6 %
HCT: 33.2 % — ABNORMAL LOW (ref 36.0–46.0)
Hemoglobin: 11.1 g/dL — ABNORMAL LOW (ref 12.0–15.0)
Immature Granulocytes: 0 %
Lymphocytes Relative: 35 %
Lymphs Abs: 2.7 10*3/uL (ref 0.7–4.0)
MCH: 32.5 pg (ref 26.0–34.0)
MCHC: 33.4 g/dL (ref 30.0–36.0)
MCV: 97.1 fL (ref 80.0–100.0)
Monocytes Absolute: 0.8 10*3/uL (ref 0.1–1.0)
Monocytes Relative: 10 %
Neutro Abs: 3.7 10*3/uL (ref 1.7–7.7)
Neutrophils Relative %: 48 %
Platelets: 331 10*3/uL (ref 150–400)
RBC: 3.42 MIL/uL — ABNORMAL LOW (ref 3.87–5.11)
RDW: 15.6 % — ABNORMAL HIGH (ref 11.5–15.5)
Smear Review: NORMAL
WBC: 7.7 10*3/uL (ref 4.0–10.5)
nRBC: 7.9 % — ABNORMAL HIGH (ref 0.0–0.2)

## 2023-03-08 NOTE — Assessment & Plan Note (Signed)
This is improved compared to previous visit Observe only

## 2023-03-08 NOTE — Assessment & Plan Note (Signed)
This has actually improved since her last visit Observe only

## 2023-03-08 NOTE — Progress Notes (Signed)
[facility] OFFICE PROGRESS NOTE  Patient Care Team: [name], MD as PCP - General (Internal Medicine) [name], MD as PCP - Cardiology (Cardiology) Charna Elizabeth, MD as Consulting Physician (Gastroenterology)  ASSESSMENT & PLAN:  Primary peritoneal adenocarcinoma (HCC) Overall, she tolerated Garey Ham well without major side effects  We will proceed with treatment without delay Plan to see her again in a few weeks for further follow-up  Anemia due to antineoplastic chemotherapy This has actually improved since her last visit Observe only  Elevated serum creatinine This is improved compared to previous visit Observe only  No orders of the defined types were placed in this encounter.   All questions were answered. The patient knows to call the clinic with any problems, questions or concerns. The total time spent in the appointment was 20 minutes encounter with patients including review of chart and various tests results, discussions about plan of care and coordination of care plan   Artis Delay, MD 03/08/2023 9:14 AM  INTERVAL HISTORY: Please see below for problem oriented charting. she returns for treatment follow-up on olaparib She tolerated recent treatment well Energy level is good She denies nausea or other side effects of treatment  REVIEW OF SYSTEMS:   Constitutional: Denies fevers, chills or abnormal weight loss Eyes: Denies blurriness of vision Ears, nose, mouth, throat, and face: Denies mucositis or sore throat Respiratory: Denies cough, dyspnea or wheezes Cardiovascular: Denies palpitation, chest discomfort or lower extremity swelling Gastrointestinal:  Denies nausea, heartburn or change in bowel habits Skin: Denies abnormal skin rashes Lymphatics: Denies new lymphadenopathy or easy bruising Neurological:Denies numbness, tingling or new weaknesses Behavioral/Psych: Mood is stable, no new changes  All other systems were reviewed with  the patient and are negative.  I have reviewed the past medical history, past surgical history, social history and family history with the patient and they are unchanged from previous note.  ALLERGIES:  has No Known Allergies.  MEDICATIONS:  Current Outpatient Medications  Medication Sig Dispense Refill   Cholecalciferol (DIALYVITE VITAMIN D 5000) 125 MCG (5000 UT) capsule Take 5,000 Units by mouth every evening.     levothyroxine (SYNTHROID) 125 MCG tablet Take 125 mcg by mouth daily before breakfast.     olaparib (LYNPARZA) 100 MG tablet TAKE 2 TABLETS (200 MG TOTAL) TWICE A DAY. SWALLOW WHOLE. MAY TAKE WITH FOOD TO DECREASE NAUSEA AND VOMITING. 60 tablet 11   pantoprazole (PROTONIX) 40 MG tablet Take 40 mg by mouth daily before breakfast.     rosuvastatin (CRESTOR) 5 MG tablet Take 1 tablet (5 mg total) by mouth daily. 90 tablet 3   vitamin B-12 (CYANOCOBALAMIN) 500 MCG tablet Take 500 mcg by mouth every evening.     No current facility-administered medications for this visit.    SUMMARY OF ONCOLOGIC HISTORY: Oncology History Overview Note  BRCA2 positive   Primary peritoneal adenocarcinoma (HCC)  07/19/2022 Imaging   1. Small left pleural effusion  2. 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HCC)   07/23/2022 Cancer Staging   Staging form: Ovary, Fallopian Tube, and Primary Peritoneal Carcinoma, AJCC 8th Edition - Clinical stage from 07/23/2022: FIGO Stage IIIC (cT3c, cN0, cM0) - Signed by Artis Delay, MD on 07/23/2022 Stage prefix: Initial diagnosis   07/23/2022 Pathology Results   FINAL MICROSCOPIC DIAGNOSIS:  - Malignant cells present  - See comment   SPECIMEN ADEQUACY:  Satisfactory for evaluation   DIAGNOSTIC COMMENTS:  Immunohistochemical stains show that the tumor cells are positive for ER, PAX8, p16 and p53 (clonal overexpression pattern), consistent with a high-grade serous  carcinoma.  Immunostain for WT1 is also diffusely positive in the tumor cells, most consistent with an ovarian primary.  Dr. Kenyon Ana reviewed the case and concurs with the above diagnosis.    07/30/2022 Procedure   Successful right chest port placement via the right internal jugular vein. The port is ready for immediate use.   08/02/2022 - 12/28/2022 Chemotherapy   Patient is on Treatment Plan : OVARIAN Carboplatin (AUC 6) + Paclitaxel (175) q21d X 6 Cycles     08/03/2022 Procedure   Successful ultrasound-guided paracentesis yielding 4.1 liters of peritoneal fluid.   08/09/2022 Tumor Marker   Patient's tumor was tested for the following markers: CA-125. Results of the tumor marker test revealed 452.   08/12/2022 Echocardiogram    1. Left ventricular ejection fraction, by estimation, is 40 to 45%. The left ventricle has mildly decreased function. The left ventricle demonstrates global hypokinesis. Left ventricular diastolic parameters are indeterminate.   2. Right ventricular systolic function is normal. The right ventricular size is normal.   3. The mitral valve is normal in structure. Mild mitral valve regurgitation. No evidence of mitral stenosis.   4. The aortic valve is normal in structure. Aortic valve regurgitation is mild to moderate. Aortic valve sclerosis/calcification is present, without any evidence of aortic stenosis.   5. The inferior vena cava is normal in size with greater than 50% respiratory variability, suggesting right atrial pressure of 3 mmHg.    09/23/2022 Tumor Marker   Patient's tumor was tested for the following markers: CA-125. Results of the tumor marker test revealed 41.3.   10/07/2022 Imaging   1. Compared to the outside abdominopelvic CT of 07/19/2022, significant improvement in peritoneal carcinomatosis, without bowel obstruction or other acute complication. 2. Decreased size of upper abdominal nodes, possibly also representing response to therapy. 3. No new  or progressive disease and no evidence of thoracic metastasis. 4. Bladder wall thickening and mucosal hyperenhancement are suspicious for cystitis. 5. 4 mm left upper lobe pulmonary nodule is most likely benign/incidental but can be re-evaluated at follow-up. 6. Aortic valvular calcifications. Consider echocardiography to evaluate for valvular dysfunction. 7. Coronary artery atherosclerosis. Aortic Atherosclerosis (ICD10-I70.0).     11/11/2022 Surgery   Exploratory laparotomy, lysis of adhesions for approximately 45 minutes, total omentectomy with radical tumor debulking including resection of perigastric nodule, excision and fulguration of multiple small bowel mesenteric nodules, resection of multiple sigmoid epiploica, fulguration of peritoneal lesions along bilateral pelvic sidewalls, peritoneal stripping of bladder peritoneum   Findings: On bimanual exam, no masses appreciated at the vaginal cuff, no nodularity.  On intra-abdominal entry, infracolic omentum retracted and involved by residual tumor implant measuring approximately 10 x 4 cm.  Nodularity palpated almost up to the splenic flexure.  Supracolic omentum involved to within 2 cm of the greater curvature of the stomach.  Additional 2 x 3 cm implant noted lateral and inferior to the gastric pylorus (sitting anterior to the duodenum and just inferior to the gallbladder).  Ascending and transverse colon normal in appearance.  Small bowel run from the ileocecal valve to the ligament of Treitz.  Rare small nodules within the mesentery noted.  These were all either excised or fulgurated.  Right aspect of the liver and diaphragm smooth to palpation, no visible lesions.  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  No ascites.  Within the pelvis, minimal nodularity seen along bilateral pelvic sidewalls near the infundibulopelvic remnants, suspected to be related to prior hysterectomy and BSO.   Several sigmoid epiploica with tumor implants versus treated tumor, all excised fulgurated.  No nodularity within the deep pelvis although some evidence of what appeared to be treated tumor versus tumor rind involving the bladder peritoneum was stripped in this location.  Biopsies taken from the posterior cul-de-sac. R0 resection at the end of surgery.   11/11/2022 Pathology Results   A. SMALL BOWEL MESENTARY NODULES: -  Densely fibrotic soft tissue/scar with abundant foamy histiocytes, negative for malignancy.  B. 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  C. PERIGASTRIC IMPLANTS, EXCISION: -  High-grade serous carcinoma.  D. SIGMOID EPIPLOICA, EXCISION: High-grade serous carcinoma in the background of dense stromal fibrosis/scar, calcifications and foamy histiocytes.  E. BLADDER PERITONEUM, EXCISION: -  High-grade serous carcinoma.  F. CUL DE SAC, POSTERIOR, BIOPSY: -  Focal high-grade serous carcinoma in the background of predominantly broad and foamy histiocytes.  Note: It is noted that the patient had a diagnosis of high-grade serous carcinoma from a cytology of the ascites in September 2023 and was clinically staged as cT3c.  Patient is now status post neoadjuvant chemotherapy.  It is noted that this is a debulking surgery.  No additional testing is performed to reserve tissue for clinician directed testing.    12/08/2022 Tumor Marker   Patient's tumor was tested for the following markers: CA-125. Results of the tumor marker test revealed 10.2.   02/03/2023 Imaging   1. Compared to 10/06/2022, interval omentectomy and peritoneal stripping. No residual omental thickening/nodularity. Scattered areas of mesenteric stranding may be postsurgical. 2. No new metastatic disease in the abdomen or  pelvis. 3. Unchanged 5 mm enhancing focus along the anterior pancreatic body/tail compared to 07/19/2022. Differential includes intrapancreatic spleen or neoplasm, including small neuroendocrine tumor. Given the size of this lesion, evaluation by MRI would likely be suboptimal. 4. Aortic Atherosclerosis (ICD10-I70.0). Coronary artery calcifications. Assessment for potential risk factor modification, dietary therapy or pharmacologic therapy may be warranted, if clinically indicated.   02/03/2023 Tumor Marker   Patient's tumor was tested for the following markers: CA-125. Results of the tumor marker test revealed 7.8.     PHYSICAL EXAMINATION: ECOG PERFORMANCE STATUS: 0 - Asymptomatic  Vitals:   03/08/23 0853  BP: (!) 113/55  Pulse: (!) 105  Resp: 18  Temp: 97.6 F (36.4 C)  SpO2: 100%   Filed Weights   03/08/23 0853  Weight: 164 lb 9.6 oz (74.7 kg)    GENERAL:alert, no distress and comfortable  NEURO: alert & oriented x 3 with fluent speech, no focal motor/sensory deficits  LABORATORY DATA:  I have reviewed the data as listed    Component Value Date/Time   NA 139 03/08/2023 0827   K 4.2 03/08/2023 0827   CL 104 03/08/2023 0827   CO2 29 03/08/2023 0827   GLUCOSE 109 (H) 03/08/2023 0827   BUN 26 (H) 03/08/2023 0827   CREATININE 1.23 (H) 03/08/2023 0827   CREATININE 0.94 12/28/2022 0900   CALCIUM 9.2 03/08/2023 0827   PROT 7.7 03/08/2023 0827   PROT 7.4 02/25/2023 0916   ALBUMIN 3.9 03/08/2023 0827   ALBUMIN 3.9 02/25/2023 0916   AST 16 03/08/2023 0827   AST 18 12/28/2022 0900   ALT 13 03/08/2023 0827  ALT 18 12/28/2022 0900   ALKPHOS 65 03/08/2023 0827   BILITOT 0.3 03/08/2023 0827   BILITOT <0.2 02/25/2023 0916   BILITOT 0.2 (L) 12/28/2022 0900   GFRNONAA 51 (L) 03/08/2023 0827   GFRNONAA >60 12/28/2022 0900    No results found for: "SPEP", "UPEP"  Lab Results  Component Value Date   WBC 7.7 03/08/2023   NEUTROABS PENDING 03/08/2023   HGB 11.1 (L)  03/08/2023   HCT 33.2 (L) 03/08/2023   MCV 97.1 03/08/2023   PLT 331 03/08/2023      Chemistry      Component Value Date/Time   NA 139 03/08/2023 0827   K 4.2 03/08/2023 0827   CL 104 03/08/2023 0827   CO2 29 03/08/2023 0827   BUN 26 (H) 03/08/2023 0827   CREATININE 1.23 (H) 03/08/2023 0827   CREATININE 0.94 12/28/2022 0900      Component Value Date/Time   CALCIUM 9.2 03/08/2023 0827   ALKPHOS 65 03/08/2023 0827   AST 16 03/08/2023 0827   AST 18 12/28/2022 0900   ALT 13 03/08/2023 0827   ALT 18 12/28/2022 0900   BILITOT 0.3 03/08/2023 0827   BILITOT <0.2 02/25/2023 0916   BILITOT 0.2 (L) 12/28/2022 0900

## 2023-03-08 NOTE — Assessment & Plan Note (Signed)
Overall, she tolerated Lynparza well without major side effects  We will proceed with treatment without delay Plan to see her again in a few weeks for further follow-up

## 2023-03-09 LAB — CA 125: Cancer Antigen (CA) 125: 7.6 U/mL (ref 0.0–38.1)

## 2023-03-15 ENCOUNTER — Inpatient Hospital Stay (HOSPITAL_BASED_OUTPATIENT_CLINIC_OR_DEPARTMENT_OTHER): Payer: BC Managed Care – PPO | Admitting: Gynecologic Oncology

## 2023-03-15 ENCOUNTER — Encounter: Payer: Self-pay | Admitting: Gynecologic Oncology

## 2023-03-15 ENCOUNTER — Other Ambulatory Visit: Payer: Self-pay

## 2023-03-15 VITALS — BP 146/88 | HR 106 | Temp 97.8°F | Resp 16 | Wt 159.0 lb

## 2023-03-15 DIAGNOSIS — Z9221 Personal history of antineoplastic chemotherapy: Secondary | ICD-10-CM

## 2023-03-15 DIAGNOSIS — R1905 Periumbilic swelling, mass or lump: Secondary | ICD-10-CM

## 2023-03-15 DIAGNOSIS — C482 Malignant neoplasm of peritoneum, unspecified: Secondary | ICD-10-CM

## 2023-03-15 DIAGNOSIS — Z800 Family history of malignant neoplasm of digestive organs: Secondary | ICD-10-CM | POA: Diagnosis not present

## 2023-03-15 DIAGNOSIS — Z1501 Genetic susceptibility to malignant neoplasm of breast: Secondary | ICD-10-CM

## 2023-03-15 DIAGNOSIS — Z1509 Genetic susceptibility to other malignant neoplasm: Secondary | ICD-10-CM

## 2023-03-15 DIAGNOSIS — R222 Localized swelling, mass and lump, trunk: Secondary | ICD-10-CM

## 2023-03-15 DIAGNOSIS — R7989 Other specified abnormal findings of blood chemistry: Secondary | ICD-10-CM | POA: Diagnosis not present

## 2023-03-15 DIAGNOSIS — Z8042 Family history of malignant neoplasm of prostate: Secondary | ICD-10-CM

## 2023-03-15 DIAGNOSIS — Z1502 Genetic susceptibility to malignant neoplasm of ovary: Secondary | ICD-10-CM

## 2023-03-15 DIAGNOSIS — Z90710 Acquired absence of both cervix and uterus: Secondary | ICD-10-CM | POA: Diagnosis not present

## 2023-03-15 DIAGNOSIS — Z90722 Acquired absence of ovaries, bilateral: Secondary | ICD-10-CM | POA: Diagnosis not present

## 2023-03-15 DIAGNOSIS — Z79899 Other long term (current) drug therapy: Secondary | ICD-10-CM | POA: Diagnosis not present

## 2023-03-15 DIAGNOSIS — Z803 Family history of malignant neoplasm of breast: Secondary | ICD-10-CM

## 2023-03-15 DIAGNOSIS — D6481 Anemia due to antineoplastic chemotherapy: Secondary | ICD-10-CM | POA: Diagnosis not present

## 2023-03-15 NOTE — Patient Instructions (Signed)
We will plan on obtaining an ultrasound of the area on your abdomen. You will be contacted with the results.  Please call for any needs or questions.

## 2023-03-15 NOTE — Progress Notes (Unsigned)
Patient reports noticing the knot-like area to the right of the umbilicus on Sunday.  She has been exercising for around 1 hour a day.  She is not doing planks.  Tolerating diet with no nausea or emesis.  No drainage or erythema from this area on the abdomen.  She states she may have lost a few pounds.  The area is tender on palpation.

## 2023-03-15 NOTE — Telephone Encounter (Signed)
Called pt in regards to message below.  Pt states the hard spot is tender to touch & sore (pain at 6/7 only when palpating), to the right of her belly button next to incision, and the hard spot is about a quarter or half dollar in size.  She has started introducing exercise again as well. Pt denies any redness, oozing and itching.   Offered pt to send a picture through mychart or come into the office to see Warner Mccreedy, NP.  Made pt appt for today at 2 pm. Pt verbalized understanding.

## 2023-03-16 ENCOUNTER — Encounter: Payer: Self-pay | Admitting: Hematology and Oncology

## 2023-03-16 DIAGNOSIS — R222 Localized swelling, mass and lump, trunk: Secondary | ICD-10-CM | POA: Insufficient documentation

## 2023-03-18 ENCOUNTER — Ambulatory Visit (HOSPITAL_COMMUNITY)
Admission: RE | Admit: 2023-03-18 | Discharge: 2023-03-18 | Disposition: A | Discharge status: Home / Self Care | Payer: BC Managed Care – PPO | Source: Ambulatory Visit | Attending: Gynecologic Oncology | Admitting: Gynecologic Oncology

## 2023-03-18 DIAGNOSIS — R1900 Intra-abdominal and pelvic swelling, mass and lump, unspecified site: Secondary | ICD-10-CM | POA: Diagnosis not present

## 2023-03-18 DIAGNOSIS — R222 Localized swelling, mass and lump, trunk: Secondary | ICD-10-CM | POA: Diagnosis not present

## 2023-03-22 ENCOUNTER — Telehealth: Payer: Self-pay | Admitting: *Deleted

## 2023-03-22 ENCOUNTER — Telehealth: Payer: Self-pay

## 2023-03-22 NOTE — Telephone Encounter (Signed)
-----   Message from Doylene Bode, NP sent at 03/22/2023 10:16 AM EDT ----- Please reach out to this patient as soon as you can about her recent abdominal US. They feel the area felt on her abdomen is related to after surgery scarring and edema related to healing. They do not see a solid mass or evidence of a hernia. How is the area feeling now? Would have her monitor this and call for any changes. Esp with doing more exercises, it may become more pronounced.

## 2023-03-22 NOTE — Telephone Encounter (Signed)
Spoke with Ms. Bodie and relayed message from Warner Mccreedy, NP of her abdominal ultrasound results.  Pt states she doesn't feel the area on her abdomen as much and she still is not having any pain. Patient will continue to monitor and will call the office with any new changes.

## 2023-03-22 NOTE — Telephone Encounter (Signed)
Called and moved 6/14 appts to 6/13. She is aware of appt date/time.

## 2023-04-07 ENCOUNTER — Other Ambulatory Visit: Payer: Self-pay

## 2023-04-07 ENCOUNTER — Inpatient Hospital Stay: Payer: BC Managed Care – PPO | Attending: Gynecologic Oncology | Admitting: Gynecologic Oncology

## 2023-04-07 ENCOUNTER — Inpatient Hospital Stay: Payer: BC Managed Care – PPO

## 2023-04-07 ENCOUNTER — Encounter: Payer: Self-pay | Admitting: Hematology and Oncology

## 2023-04-07 ENCOUNTER — Encounter: Payer: Self-pay | Admitting: Gynecologic Oncology

## 2023-04-07 ENCOUNTER — Inpatient Hospital Stay (HOSPITAL_BASED_OUTPATIENT_CLINIC_OR_DEPARTMENT_OTHER): Payer: BC Managed Care – PPO | Admitting: Hematology and Oncology

## 2023-04-07 VITALS — BP 130/70 | HR 105 | Temp 98.1°F | Resp 18 | Ht 62.21 in | Wt 162.6 lb

## 2023-04-07 VITALS — BP 116/51 | HR 104 | Temp 97.7°F | Resp 18 | Ht 63.0 in | Wt 163.6 lb

## 2023-04-07 DIAGNOSIS — R5383 Other fatigue: Secondary | ICD-10-CM | POA: Diagnosis not present

## 2023-04-07 DIAGNOSIS — T451X5A Adverse effect of antineoplastic and immunosuppressive drugs, initial encounter: Secondary | ICD-10-CM

## 2023-04-07 DIAGNOSIS — Z79899 Other long term (current) drug therapy: Secondary | ICD-10-CM | POA: Diagnosis not present

## 2023-04-07 DIAGNOSIS — C482 Malignant neoplasm of peritoneum, unspecified: Secondary | ICD-10-CM | POA: Diagnosis not present

## 2023-04-07 DIAGNOSIS — D6481 Anemia due to antineoplastic chemotherapy: Secondary | ICD-10-CM

## 2023-04-07 DIAGNOSIS — Z148 Genetic carrier of other disease: Secondary | ICD-10-CM | POA: Insufficient documentation

## 2023-04-07 LAB — CBC WITH DIFFERENTIAL/PLATELET
Abs Immature Granulocytes: 0.02 10*3/uL (ref 0.00–0.07)
Basophils Absolute: 0.1 10*3/uL (ref 0.0–0.1)
Basophils Relative: 1 %
Eosinophils Absolute: 0.5 10*3/uL (ref 0.0–0.5)
Eosinophils Relative: 8 %
HCT: 33.3 % — ABNORMAL LOW (ref 36.0–46.0)
Hemoglobin: 11 g/dL — ABNORMAL LOW (ref 12.0–15.0)
Immature Granulocytes: 0 %
Lymphocytes Relative: 36 %
Lymphs Abs: 2.3 10*3/uL (ref 0.7–4.0)
MCH: 32.4 pg (ref 26.0–34.0)
MCHC: 33 g/dL (ref 30.0–36.0)
MCV: 97.9 fL (ref 80.0–100.0)
Monocytes Absolute: 0.7 10*3/uL (ref 0.1–1.0)
Monocytes Relative: 11 %
Neutro Abs: 2.9 10*3/uL (ref 1.7–7.7)
Neutrophils Relative %: 44 %
Platelets: 311 10*3/uL (ref 150–400)
RBC: 3.4 MIL/uL — ABNORMAL LOW (ref 3.87–5.11)
RDW: 18.1 % — ABNORMAL HIGH (ref 11.5–15.5)
WBC: 6.6 10*3/uL (ref 4.0–10.5)
nRBC: 15 % — ABNORMAL HIGH (ref 0.0–0.2)

## 2023-04-07 LAB — COMPREHENSIVE METABOLIC PANEL
ALT: 14 U/L (ref 0–44)
AST: 18 U/L (ref 15–41)
Albumin: 3.7 g/dL (ref 3.5–5.0)
Alkaline Phosphatase: 65 U/L (ref 38–126)
Anion gap: 6 (ref 5–15)
BUN: 20 mg/dL (ref 6–20)
CO2: 30 mmol/L (ref 22–32)
Calcium: 9.6 mg/dL (ref 8.9–10.3)
Chloride: 104 mmol/L (ref 98–111)
Creatinine, Ser: 1.23 mg/dL — ABNORMAL HIGH (ref 0.44–1.00)
GFR, Estimated: 51 mL/min — ABNORMAL LOW (ref >60)
Glucose, Bld: 109 mg/dL — ABNORMAL HIGH (ref 70–99)
Potassium: 4.2 mmol/L (ref 3.5–5.1)
Sodium: 140 mmol/L (ref 135–145)
Total Bilirubin: 0.3 mg/dL (ref 0.3–1.2)
Total Protein: 7.9 g/dL (ref 6.5–8.1)

## 2023-04-07 LAB — TSH: TSH: 4.056 u[IU]/mL (ref 0.350–4.500)

## 2023-04-07 MED ORDER — HEPARIN SOD (PORK) LOCK FLUSH 100 UNIT/ML IV SOLN
500.0000 [IU] | Freq: Once | INTRAVENOUS | Status: AC
  Administered 2023-04-07: 500 [IU]

## 2023-04-07 MED ORDER — SODIUM CHLORIDE 0.9% FLUSH
10.0000 mL | Freq: Once | INTRAVENOUS | Status: AC
  Administered 2023-04-07: 10 mL

## 2023-04-07 NOTE — Progress Notes (Signed)
Gynecologic Oncology Return Clinic Visit  04/07/23  Reason for Visit: surveillance  Treatment History: Oncology History Overview Note  BRCA2 positive   Primary peritoneal adenocarcinoma (HCC)  07/19/2022 Imaging   1. Small left pleural effusion  2. 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HCC)   07/23/2022 Cancer Staging   Staging form: Ovary, Fallopian Tube, and Primary Peritoneal Carcinoma, AJCC 8th Edition - Clinical stage from 07/23/2022: FIGO Stage IIIC (cT3c, cN0, cM0) - Signed by Artis Delay, MD on 07/23/2022 Stage prefix: Initial diagnosis   07/23/2022 Pathology Results   FINAL MICROSCOPIC DIAGNOSIS:  - Malignant cells present  - See comment   SPECIMEN ADEQUACY:  Satisfactory for evaluation   DIAGNOSTIC COMMENTS:  Immunohistochemical stains show that the tumor cells are positive for ER, PAX8, p16 and p53 (clonal overexpression pattern), consistent with a high-grade serous carcinoma.  Immunostain for WT1 is also diffusely positive in the tumor cells, most consistent with an ovarian primary.  Dr. Kenyon Ana reviewed the case and concurs with the above diagnosis.    07/30/2022 Procedure   Successful right chest port placement via the right internal jugular vein. The port is ready for immediate use.   08/02/2022 - 12/28/2022 Chemotherapy   Patient is on Treatment Plan : OVARIAN Carboplatin (AUC 6) + Paclitaxel (175) q21d X 6 Cycles     08/03/2022 Procedure   Successful ultrasound-guided paracentesis yielding 4.1 liters of peritoneal fluid.   08/09/2022 Tumor Marker   Patient's tumor was tested for the following markers: CA-125. Results of the tumor marker test revealed 452.   08/12/2022 Echocardiogram    1. Left ventricular ejection fraction, by estimation, is 40 to 45%. The left ventricle has mildly decreased function. The left ventricle demonstrates global hypokinesis. Left ventricular  diastolic parameters are indeterminate.   2. Right ventricular systolic function is normal. The right ventricular size is normal.   3. The mitral valve is normal in structure. Mild mitral valve regurgitation. No evidence of mitral stenosis.   4. The aortic valve is normal in structure. Aortic valve regurgitation is mild to moderate. Aortic valve sclerosis/calcification is present, without any evidence of aortic stenosis.   5. The inferior vena cava is normal in size with greater than 50% respiratory variability, suggesting right atrial pressure of 3 mmHg.    09/23/2022 Tumor Marker   Patient's tumor was tested for the following markers: CA-125. Results of the tumor marker test revealed 41.3.   10/07/2022 Imaging   1. Compared to the outside abdominopelvic CT of 07/19/2022, significant improvement in peritoneal carcinomatosis, without bowel obstruction or other acute complication. 2. Decreased size of upper abdominal nodes, possibly also representing response to therapy. 3. No new or progressive disease and no evidence of thoracic metastasis. 4. Bladder wall thickening and mucosal hyperenhancement are suspicious for cystitis. 5. 4 mm left upper lobe pulmonary nodule is most likely benign/incidental but can be re-evaluated at follow-up. 6. Aortic valvular calcifications. Consider echocardiography to evaluate for valvular dysfunction. 7. Coronary artery atherosclerosis. Aortic Atherosclerosis (ICD10-I70.0).     11/11/2022 Surgery   Exploratory laparotomy, lysis of adhesions for approximately 45 minutes, total omentectomy with radical tumor debulking including resection of perigastric nodule, excision and fulguration of multiple small bowel mesenteric nodules, resection of multiple sigmoid epiploica, fulguration of peritoneal lesions along bilateral pelvic sidewalls, peritoneal stripping of bladder peritoneum   Findings: On bimanual exam, no masses appreciated at the vaginal cuff, no nodularity.   On intra-abdominal entry,  infracolic omentum retracted and involved by residual tumor implant measuring approximately 10 x 4 cm.  Nodularity palpated almost up to the splenic flexure.  Supracolic omentum involved to within 2 cm of the greater curvature of the stomach.  Additional 2 x 3 cm implant noted lateral and inferior to the gastric pylorus (sitting anterior to the duodenum and just inferior to the gallbladder).  Ascending and transverse colon normal in appearance.  Small bowel run from the ileocecal valve to the ligament of Treitz.  Rare small nodules within the mesentery noted.  These were all either excised or fulgurated.  Right aspect of the liver and diaphragm smooth to palpation, no visible lesions.  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  No ascites.  Within the pelvis, minimal nodularity seen along bilateral pelvic sidewalls near the infundibulopelvic remnants, suspected to be related to prior hysterectomy and BSO.  Several sigmoid epiploica with tumor implants versus treated tumor, all excised fulgurated.  No nodularity within the deep pelvis although some evidence of what appeared to be treated tumor versus tumor rind involving the bladder peritoneum was stripped in this location.  Biopsies taken from the posterior cul-de-sac. R0 resection at the end of surgery.   11/11/2022 Pathology Results   A. SMALL BOWEL MESENTARY NODULES: -  Densely fibrotic soft tissue/scar with abundant foamy histiocytes, negative for malignancy.  B. 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  C. PERIGASTRIC IMPLANTS, EXCISION: -  High-grade serous carcinoma.  D. SIGMOID EPIPLOICA, EXCISION: High-grade serous carcinoma in the background of dense  stromal fibrosis/scar, calcifications and foamy histiocytes.  E. BLADDER PERITONEUM, EXCISION: -  High-grade serous carcinoma.  F. CUL DE SAC, POSTERIOR, BIOPSY: -  Focal high-grade serous carcinoma in the background of predominantly broad and foamy histiocytes.  Note: It is noted that the patient had a diagnosis of high-grade serous carcinoma from a cytology of the ascites in September 2023 and was clinically staged as cT3c.  Patient is now status post neoadjuvant chemotherapy.  It is noted that this is a debulking surgery.  No additional testing is performed to reserve tissue for clinician directed testing.    12/08/2022 Tumor Marker   Patient's tumor was tested for the following markers: CA-125. Results of the tumor marker test revealed 10.2.   02/03/2023 Imaging   1. Compared to 10/06/2022, interval omentectomy and peritoneal stripping. No residual omental thickening/nodularity. Scattered areas of mesenteric stranding may be postsurgical. 2. No new metastatic disease in the abdomen or pelvis. 3. Unchanged 5 mm enhancing focus along the anterior pancreatic body/tail compared to 07/19/2022. Differential includes intrapancreatic spleen or neoplasm, including small neuroendocrine tumor. Given the size of this lesion, evaluation by MRI would likely be suboptimal. 4. Aortic Atherosclerosis (ICD10-I70.0). Coronary artery calcifications. Assessment for potential risk factor modification, dietary therapy or pharmacologic therapy may be warranted, if clinically indicated.   02/03/2023 Tumor Marker   Patient's tumor was tested for the following markers: CA-125. Results of the tumor marker test revealed 7.8.   02/14/2023 -  Chemotherapy   She started taking olaparib   03/10/2023 Tumor Marker   Patient's tumor was tested for the following markers: CA-125. Results of the tumor marker test revealed 7.6.     Interval History: The patient reports overall doing well.  She has some neuropathy  that bothers her in her feet.  Denies any vaginal bleeding or discharge.  Endorses a good appetite.  Endorses normal bowel bladder function.  Is tolerating Lynparza without any significant side effects.  Past Medical/Surgical History: Past Medical History:  Diagnosis Date   Anemia    BRCA2 gene mutation positive 01/08/2019   Breast cancer (HCC) 2007   Left   GERD (gastroesophageal reflux disease)    Hodgkin's disease (HCC) 10/26/1987   clinical   Hypothyroidism    Malignant tumor of breast (HCC)    left   Pneumonia    Thyroid dysfunction    2020, as results on Hodgkin's radiation    Past Surgical History:  Procedure Laterality Date   BREAST RECONSTRUCTION     2009   COLONOSCOPY     2017   DEBULKING N/A 11/11/2022   Procedure: TUMOR DEBULKING;  Surgeon: Carver Fila, MD;  Location: [facility];  Service: Gynecology;  Laterality: N/A;   excisional of bilateral breast     2007   Hysteroscopy w.biopsy     2012   IR IMAGING GUIDED PORT INSERTION  07/29/2022   IR PARACENTESIS  07/23/2022   KNEE ARTHROSCOPY     1985   laparoscopically assisted vaginal hysterectomy w/removal of tubes and/or ovaries  04/23/2019   LYMPH NODE BIOPSY  1989   MASTECTOMY Bilateral 2007   OMENTECTOMY N/A 11/11/2022   Procedure: OPEN OMENTECTOMY, PERITONEAL STRIPPING,  LYSIS OF ADHESIONS;  Surgeon: Carver Fila, MD;  Location: [facility];  Service: Gynecology;  Laterality: N/A;   removal of spleen total     1989   RIGHT/LEFT HEART CATH AND CORONARY ANGIOGRAPHY N/A 10/28/2022   Procedure: RIGHT/LEFT HEART CATH AND CORONARY ANGIOGRAPHY;  Surgeon: [name], MD;  Location: [facility];  Service: Cardiovascular;  Laterality: N/A;    Family History  Problem Relation Age of Onset   Prostate cancer Father    Prostate cancer Brother    Colon cancer Maternal Grandmother    Breast cancer Paternal Grandmother    Ovarian cancer Neg Hx    Endometrial cancer Neg Hx    Pancreatic cancer Neg Hx      Social History   Socioeconomic History   Marital status: Married    Spouse name: Not on file   Number of children: Not on file   Years of education: Not on file   Highest education level: Not on file  Occupational History   Occupation: retired  Tobacco Use   Smoking status: Never   Smokeless tobacco: Never  Vaping Use   Vaping Use: Never used  Substance and Sexual Activity   Alcohol use: Yes    Comment: rare   Drug use: Never   Sexual activity: Yes  Other Topics Concern   Not on file  Social History Narrative   Not on file   Social Determinants of Health   Financial Resource Strain: Low Risk  (08/02/2022)   Overall Financial Resource Strain (CARDIA)    Difficulty of Paying Living Expenses: Not very hard  Food Insecurity: No Food Insecurity (11/11/2022)   Hunger Vital Sign    Worried About Running Out of Food in the Last Year: Never true    Ran Out of Food in the Last Year: Never true  Transportation Needs: No Transportation Needs (11/11/2022)   PRAPARE - Administrator, Civil Service (Medical): No    Lack of Transportation (Non-Medical): No  Physical Activity: Not on file  Stress: Not on file  Social Connections: Not on  file    Current Medications:  Current Outpatient Medications:    aspirin 81 MG chewable tablet, Chew 81 mg by mouth daily., Disp: , Rfl:    Cholecalciferol (DIALYVITE VITAMIN D 5000) 125 MCG (5000 UT) capsule, Take 5,000 Units by mouth every evening., Disp: , Rfl:    levothyroxine (SYNTHROID) 125 MCG tablet, Take 125 mcg by mouth daily before breakfast., Disp: , Rfl:    olaparib (LYNPARZA) 100 MG tablet, TAKE 2 TABLETS (200 MG TOTAL) TWICE A DAY. SWALLOW WHOLE. MAY TAKE WITH FOOD TO DECREASE NAUSEA AND VOMITING., Disp: 60 tablet, Rfl: 11   pantoprazole (PROTONIX) 40 MG tablet, Take 40 mg by mouth daily before breakfast., Disp: , Rfl:    rosuvastatin (CRESTOR) 5 MG tablet, Take 1 tablet (5 mg total) by mouth daily., Disp: 90 tablet, Rfl:  3   vitamin B-12 (CYANOCOBALAMIN) 500 MCG tablet, Take 500 mcg by mouth every evening., Disp: , Rfl:   Review of Systems: + Neuropathy Denies appetite changes, fevers, chills, fatigue, unexplained weight changes. Denies hearing loss, neck lumps or masses, mouth sores, ringing in ears or voice changes. Denies cough or wheezing.  Denies shortness of breath. Denies chest pain or palpitations. Denies leg swelling. Denies abdominal distention, pain, blood in stools, constipation, diarrhea, nausea, vomiting, or early satiety. Denies pain with intercourse, dysuria, frequency, hematuria or incontinence. Denies hot flashes, pelvic pain, vaginal bleeding or vaginal discharge.   Denies joint pain, back pain or muscle pain/cramps. Denies itching, rash, or wounds. Denies dizziness, headaches, or seizures. Denies swollen lymph nodes or glands, denies easy bruising or bleeding. Denies anxiety, depression, confusion, or decreased concentration.  Physical Exam: BP 130/70 (BP Location: Left Arm, Patient Position: Sitting)   Pulse (!) 105   Temp 98.1 F (36.7 C) (Oral)   Resp 18   Ht 5' 2.21" (1.58 m)   Wt 162 lb 9.6 oz (73.8 kg)   SpO2 99%   BMI 29.54 kg/m  General: Alert, oriented, no acute distress. HEENT: Normocephalic, atraumatic, sclera anicteric. Chest: Clear to auscultation bilaterally.  No wheezes or rhonchi. Cardiovascular: Regular rate and rhythm, no murmurs. Abdomen: soft, nontender.  Normoactive bowel sounds.  No masses or hepatosplenomegaly appreciated.  Well-healed scar.  Area of mild firmness along the midportion of the incision just below the umbilicus, nontender. Extremities: Grossly normal range of motion.  Warm, well perfused.  No edema bilaterally. Skin: No rashes or lesions noted. Lymphatics: No cervical, supraclavicular, or inguinal adenopathy. GU: Normal appearing external genitalia without erythema, excoriation, or lesions.  Speculum exam reveals moderately atrophic vaginal  mucosa, no lesions.  Bimanual exam reveals cuff intact, no masses or nodularity.  Rectovaginal exam confirms findings.  Laboratory & Radiologic Studies: 01/2023:  1. Compared to October 21, 2022, interval omentectomy and peritoneal stripping. No residual omental thickening/nodularity. Scattered areas of mesenteric stranding may be postsurgical. 2. No new metastatic disease in the abdomen or pelvis. 3. Unchanged 5 mm enhancing focus along the anterior pancreatic body/tail compared to 07/19/2022. Differential includes intrapancreatic spleen or neoplasm, including small neuroendocrine tumor. Given the size of this lesion, evaluation by MRI would likely be suboptimal. 4. Aortic Atherosclerosis (ICD10-I70.0). Coronary artery calcifications. Assessment for potential risk factor modification, dietary therapy or pharmacologic therapy may be warranted, if clinically indicated.  Component Ref Range & Units 1 mo ago (03/08/23) 2 mo ago (02/01/23) 4 mo ago (12/07/22) 6 mo ago (09/20/22) 8 mo ago (07/30/22) 8 mo ago (07/23/22)  Cancer Antigen (CA) 125 0.0 - 38.1 U/mL 7.6 7.8 CM 10.2  CM 41.3 High  CM 452.0 High  CM 490.0 High  CM   Assessment & Plan: Lauren Chandler is a 57 y.o. 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 Now on Lynparza for maintenance in the setting of BRCA2 mutation.   The patient is doing very well.  She is NED based on exam and her last imaging.  CA125 was normal a month ago.  Lab from this morning is still pending.   She now on Lynparza for maintenance and tolerating this without any significant side effects.   NCCN surveillance recommendations, we will continue with follow-up visits every 2-4 months.  I will plan to see her in October.  She is scheduled to see Dr. Bertis Ruddy in mid August.  20 minutes of total time was spent for this patient  encounter, including preparation, face-to-face counseling with the patient and coordination of care, and documentation of the encounter.  Eugene Garnet, MD  Division of Gynecologic Oncology  Department of Obstetrics and Gynecology  [facility]

## 2023-04-07 NOTE — Progress Notes (Signed)
Given copy of TSH results today and told it is normal at her visit with Dr. Pricilla Holm.

## 2023-04-07 NOTE — Assessment & Plan Note (Signed)
She has stable anemia with hemoglobin around 11 I believe this is her baseline Observe only

## 2023-04-07 NOTE — Progress Notes (Signed)
[facility] OFFICE PROGRESS NOTE  Patient Care Team: [name], MD as PCP - General (Internal Medicine) [name], MD as PCP - Cardiology (Cardiology) Charna Elizabeth, MD as Consulting Physician (Gastroenterology)  ASSESSMENT & PLAN:  Primary peritoneal adenocarcinoma (HCC) Overall, Lauren Chandler tolerated Garey Ham well without major side effects  We will proceed with treatment without delay Plan to see her again in 2 months for further follow-up Plan to repeat imaging in October  Anemia due to antineoplastic chemotherapy Lauren Chandler has stable anemia with hemoglobin around 11 I believe this is her baseline Observe only  Orders Placed This Encounter  Procedures   TSH    Standing Status:   Future    Standing Expiration Date:   04/06/2024    All questions were answered. The patient knows to call the clinic with any problems, questions or concerns. The total time spent in the appointment was 20 minutes encounter with patients including review of chart and various tests results, discussions about plan of care and coordination of care plan   Artis Delay, MD 04/07/2023 9:19 AM  INTERVAL HISTORY: Please see below for problem oriented charting. Lauren Chandler returns for treatment follow-up on olaparib Lauren Chandler is doing well Lauren Chandler was evaluated recently due to palpable nodule along her incision scar Otherwise, Lauren Chandler have no side effects from treatment so far We discussed timing of next imaging and future follow-up  REVIEW OF SYSTEMS:   Constitutional: Denies fevers, chills or abnormal weight loss Eyes: Denies blurriness of vision Ears, nose, mouth, throat, and face: Denies mucositis or sore throat Respiratory: Denies cough, dyspnea or wheezes Cardiovascular: Denies palpitation, chest discomfort or lower extremity swelling Gastrointestinal:  Denies nausea, heartburn or change in bowel habits Skin: Denies abnormal skin rashes Lymphatics: Denies new lymphadenopathy or easy  bruising Neurological:Denies numbness, tingling or new weaknesses Behavioral/Psych: Mood is stable, no new changes  All other systems were reviewed with the patient and are negative.  I have reviewed the past medical history, past surgical history, social history and family history with the patient and they are unchanged from previous note.  ALLERGIES:  has No Known Allergies.  MEDICATIONS:  Current Outpatient Medications  Medication Sig Dispense Refill   Cholecalciferol (DIALYVITE VITAMIN D 5000) 125 MCG (5000 UT) capsule Take 5,000 Units by mouth every evening.     levothyroxine (SYNTHROID) 125 MCG tablet Take 125 mcg by mouth daily before breakfast.     olaparib (LYNPARZA) 100 MG tablet TAKE 2 TABLETS (200 MG TOTAL) TWICE A DAY. SWALLOW WHOLE. MAY TAKE WITH FOOD TO DECREASE NAUSEA AND VOMITING. 60 tablet 11   pantoprazole (PROTONIX) 40 MG tablet Take 40 mg by mouth daily before breakfast.     rosuvastatin (CRESTOR) 5 MG tablet Take 1 tablet (5 mg total) by mouth daily. 90 tablet 3   vitamin B-12 (CYANOCOBALAMIN) 500 MCG tablet Take 500 mcg by mouth every evening.     No current facility-administered medications for this visit.    SUMMARY OF ONCOLOGIC HISTORY: Oncology History Overview Note  BRCA2 positive   Primary peritoneal adenocarcinoma (HCC)  07/19/2022 Imaging   1. Small left pleural effusion  2. 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HCC)   07/23/2022 Cancer Staging   Staging form: Ovary, Fallopian Tube, and Primary Peritoneal Carcinoma, AJCC 8th Edition - Clinical stage from 07/23/2022: FIGO Stage IIIC (cT3c, cN0, cM0) - Signed by Artis Delay, MD on 07/23/2022 Stage prefix: Initial diagnosis  07/23/2022 Pathology Results   FINAL MICROSCOPIC DIAGNOSIS:  - Malignant cells present  - See comment   SPECIMEN ADEQUACY:  Satisfactory for evaluation   DIAGNOSTIC COMMENTS:   Immunohistochemical stains show that the tumor cells are positive for ER, PAX8, p16 and p53 (clonal overexpression pattern), consistent with a high-grade serous carcinoma.  Immunostain for WT1 is also diffusely positive in the tumor cells, most consistent with an ovarian primary.  Dr. Kenyon Ana reviewed the case and concurs with the above diagnosis.    07/30/2022 Procedure   Successful right chest port placement via the right internal jugular vein. The port is ready for immediate use.   08/02/2022 - 12/28/2022 Chemotherapy   Patient is on Treatment Plan : OVARIAN Carboplatin (AUC 6) + Paclitaxel (175) q21d X 6 Cycles     08/03/2022 Procedure   Successful ultrasound-guided paracentesis yielding 4.1 liters of peritoneal fluid.   08/09/2022 Tumor Marker   Patient's tumor was tested for the following markers: CA-125. Results of the tumor marker test revealed 452.   08/12/2022 Echocardiogram    1. Left ventricular ejection fraction, by estimation, is 40 to 45%. The left ventricle has mildly decreased function. The left ventricle demonstrates global hypokinesis. Left ventricular diastolic parameters are indeterminate.   2. Right ventricular systolic function is normal. The right ventricular size is normal.   3. The mitral valve is normal in structure. Mild mitral valve regurgitation. No evidence of mitral stenosis.   4. The aortic valve is normal in structure. Aortic valve regurgitation is mild to moderate. Aortic valve sclerosis/calcification is present, without any evidence of aortic stenosis.   5. The inferior vena cava is normal in size with greater than 50% respiratory variability, suggesting right atrial pressure of 3 mmHg.    09/23/2022 Tumor Marker   Patient's tumor was tested for the following markers: CA-125. Results of the tumor marker test revealed 41.3.   10/07/2022 Imaging   1. Compared to the outside abdominopelvic CT of 07/19/2022, significant improvement in peritoneal  carcinomatosis, without bowel obstruction or other acute complication. 2. Decreased size of upper abdominal nodes, possibly also representing response to therapy. 3. No new or progressive disease and no evidence of thoracic metastasis. 4. Bladder wall thickening and mucosal hyperenhancement are suspicious for cystitis. 5. 4 mm left upper lobe pulmonary nodule is most likely benign/incidental but can be re-evaluated at follow-up. 6. Aortic valvular calcifications. Consider echocardiography to evaluate for valvular dysfunction. 7. Coronary artery atherosclerosis. Aortic Atherosclerosis (ICD10-I70.0).     11/11/2022 Surgery   Exploratory laparotomy, lysis of adhesions for approximately 45 minutes, total omentectomy with radical tumor debulking including resection of perigastric nodule, excision and fulguration of multiple small bowel mesenteric nodules, resection of multiple sigmoid epiploica, fulguration of peritoneal lesions along bilateral pelvic sidewalls, peritoneal stripping of bladder peritoneum   Findings: On bimanual exam, no masses appreciated at the vaginal cuff, no nodularity.  On intra-abdominal entry, infracolic omentum retracted and involved by residual tumor implant measuring approximately 10 x 4 cm.  Nodularity palpated almost up to the splenic flexure.  Supracolic omentum involved to within 2 cm of the greater curvature of the stomach.  Additional 2 x 3 cm implant noted lateral and inferior to the gastric pylorus (sitting anterior to the duodenum and just inferior to the gallbladder).  Ascending and transverse colon normal in appearance.  Small bowel run from the ileocecal valve to the ligament of Treitz.  Rare small nodules within the mesentery noted.  These were all either excised or fulgurated.  Right aspect of the liver and diaphragm smooth to palpation, no visible lesions.  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  No  ascites.  Within the pelvis, minimal nodularity seen along bilateral pelvic sidewalls near the infundibulopelvic remnants, suspected to be related to prior hysterectomy and BSO.  Several sigmoid epiploica with tumor implants versus treated tumor, all excised fulgurated.  No nodularity within the deep pelvis although some evidence of what appeared to be treated tumor versus tumor rind involving the bladder peritoneum was stripped in this location.  Biopsies taken from the posterior cul-de-sac. R0 resection at the end of surgery.   11/11/2022 Pathology Results   A. SMALL BOWEL MESENTARY NODULES: -  Densely fibrotic soft tissue/scar with abundant foamy histiocytes, negative for malignancy.  B. 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  C. PERIGASTRIC IMPLANTS, EXCISION: -  High-grade serous carcinoma.  D. SIGMOID EPIPLOICA, EXCISION: High-grade serous carcinoma in the background of dense stromal fibrosis/scar, calcifications and foamy histiocytes.  E. BLADDER PERITONEUM, EXCISION: -  High-grade serous carcinoma.  F. CUL DE SAC, POSTERIOR, BIOPSY: -  Focal high-grade serous carcinoma in the background of predominantly broad and foamy histiocytes.  Note: It is noted that the patient had a diagnosis of high-grade serous carcinoma from a cytology of the ascites in September 2023 and was clinically staged as cT3c.  Patient is now status post neoadjuvant chemotherapy.  It is noted that this is a debulking surgery.  No additional testing is performed to reserve tissue for clinician directed testing.    12/08/2022 Tumor Marker   Patient's tumor was tested for the following markers: CA-125. Results of the tumor marker test revealed 10.2.   02/03/2023 Imaging   1. Compared to 10/06/2022, interval omentectomy  and peritoneal stripping. No residual omental thickening/nodularity. Scattered areas of mesenteric stranding may be postsurgical. 2. No new metastatic disease in the abdomen or pelvis. 3. Unchanged 5 mm enhancing focus along the anterior pancreatic body/tail compared to 07/19/2022. Differential includes intrapancreatic spleen or neoplasm, including small neuroendocrine tumor. Given the size of this lesion, evaluation by MRI would likely be suboptimal. 4. Aortic Atherosclerosis (ICD10-I70.0). Coronary artery calcifications. Assessment for potential risk factor modification, dietary therapy or pharmacologic therapy may be warranted, if clinically indicated.   02/03/2023 Tumor Marker   Patient's tumor was tested for the following markers: CA-125. Results of the tumor marker test revealed 7.8.   02/14/2023 -  Chemotherapy   Lauren Chandler started taking olaparib   03/10/2023 Tumor Marker   Patient's tumor was tested for the following markers: CA-125. Results of the tumor marker test revealed 7.6.     PHYSICAL EXAMINATION: ECOG PERFORMANCE STATUS: 0 - Asymptomatic  Vitals:   04/07/23 0909  BP: (!) 116/51  Pulse: (!) 104  Resp: 18  Temp: 97.7 F (36.5 C)  SpO2: 98%   Filed Weights   04/07/23 0909  Weight: 163 lb 9.6 oz (74.2 kg)    GENERAL:alert, no distress and comfortable ABDOMEN: Noted thickening along her incision compatible with scar tissue Musculoskeletal:no cyanosis of digits and no clubbing  NEURO: alert & oriented x 3 with fluent speech, no focal motor/sensory deficits  LABORATORY DATA:  I have reviewed the data as listed    Component Value Date/Time   NA 139 03/08/2023 0827   K 4.2 03/08/2023 0827   CL 104  03/08/2023 0827   CO2 29 03/08/2023 0827   GLUCOSE 109 (H) 03/08/2023 0827   BUN 26 (H) 03/08/2023 0827   CREATININE 1.23 (H) 03/08/2023 0827   CREATININE 0.94 12/28/2022 0900   CALCIUM 9.2 03/08/2023 0827   PROT 7.7 03/08/2023 0827   PROT 7.4 02/25/2023 0916    ALBUMIN 3.9 03/08/2023 0827   ALBUMIN 3.9 02/25/2023 0916   AST 16 03/08/2023 0827   AST 18 12/28/2022 0900   ALT 13 03/08/2023 0827   ALT 18 12/28/2022 0900   ALKPHOS 65 03/08/2023 0827   BILITOT 0.3 03/08/2023 0827   BILITOT <0.2 02/25/2023 0916   BILITOT 0.2 (L) 12/28/2022 0900   GFRNONAA 51 (L) 03/08/2023 0827   GFRNONAA >60 12/28/2022 0900    No results found for: "SPEP", "UPEP"  Lab Results  Component Value Date   WBC 6.6 04/07/2023   NEUTROABS 2.9 04/07/2023   HGB 11.0 (L) 04/07/2023   HCT 33.3 (L) 04/07/2023   MCV 97.9 04/07/2023   PLT 311 04/07/2023      Chemistry      Component Value Date/Time   NA 139 03/08/2023 0827   K 4.2 03/08/2023 0827   CL 104 03/08/2023 0827   CO2 29 03/08/2023 0827   BUN 26 (H) 03/08/2023 0827   CREATININE 1.23 (H) 03/08/2023 0827   CREATININE 0.94 12/28/2022 0900      Component Value Date/Time   CALCIUM 9.2 03/08/2023 0827   ALKPHOS 65 03/08/2023 0827   AST 16 03/08/2023 0827   AST 18 12/28/2022 0900   ALT 13 03/08/2023 0827   ALT 18 12/28/2022 0900   BILITOT 0.3 03/08/2023 0827   BILITOT <0.2 02/25/2023 0916   BILITOT 0.2 (L) 12/28/2022 0900       RADIOGRAPHIC STUDIES: I have personally reviewed the radiological images as listed and agreed with the findings in the report. US Abdomen Limited  Result Date: 03/19/2023 CLINICAL DATA:  Superficial not like area in the abdominal wall beside a healed abdominal incision. EXAM: ULTRASOUND ABDOMEN LIMITED COMPARISON:  CT abdomen pelvis dated 02/01/2023 FINDINGS: Targeted ultrasound was performed by the sonographer in the area of concern. Postoperative scar and subcutaneous edema are noted in the area of concern. No definite solid mass or cystic is identified. No abdominal wall hernia is seen. IMPRESSION: Postoperative scar and subcutaneous edema are noted in the area of concern. No definite solid mass or cystic is identified. No abdominal wall hernia is seen. Electronically Signed    By: Romona Curls M.D.   On: 03/19/2023 07:37

## 2023-04-07 NOTE — Assessment & Plan Note (Signed)
Overall, she tolerated Lynparza well without major side effects  We will proceed with treatment without delay Plan to see her again in 2 months for further follow-up Plan to repeat imaging in October

## 2023-04-07 NOTE — Patient Instructions (Signed)
It was good to see you today.  I do not see or feel any evidence of cancer recurrence on your exam.  I will see you for follow-up in 4 months.  As always, if you develop any new and concerning symptoms before your next visit, please call to see me sooner.

## 2023-04-08 ENCOUNTER — Other Ambulatory Visit: Payer: BC Managed Care – PPO

## 2023-04-08 ENCOUNTER — Ambulatory Visit: Payer: BC Managed Care – PPO | Admitting: Hematology and Oncology

## 2023-04-08 LAB — CA 125: Cancer Antigen (CA) 125: 8.8 U/mL (ref 0.0–38.1)

## 2023-04-15 ENCOUNTER — Other Ambulatory Visit: Payer: Self-pay

## 2023-04-15 DIAGNOSIS — C482 Malignant neoplasm of peritoneum, unspecified: Secondary | ICD-10-CM

## 2023-04-15 MED ORDER — OLAPARIB 100 MG PO TABS
ORAL_TABLET | ORAL | 9 refills | Status: DC
Start: 2023-04-15 — End: 2024-02-21

## 2023-06-03 NOTE — Progress Notes (Signed)
Cardiology Office Note:   Date:  06/10/2023  ID:  KHALEIGH Chandler, DOB 1966-08-09, MRN [id number] PCP:  [name], MD  [facility] Providers Cardiologist:  Alverda Skeans, MD Referring MD: [name], MD   Chief Complaint/Reason for Referral: Cardiology follow-up  PATIENT DID NOT APPEAR FOR APPOINTMENT  ASSESSMENT:    No diagnosis found.   PLAN:   In order of problems listed above:  PATIENT DID NOT APPEAR FOR APPOINTMENT  1.  Coronary artery calcification: Continue aspirin and low-dose Crestor. 2.  Aortic atherosclerosis: Continue aspirin and low-dose Crestor. 3.  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 4.  Nonischemic cardiomyopathy:  PATIENT DID NOT APPEAR FOR APPOINTMENT        History of Present Illness:   FOCUSED PROBLEM LIST:   1.  Coronary artery calcification and aortic atherosclerosis CT scan 2023 2.  Hyperlipidemia; intolerant of atorvastatin and rosuvastatin; elevated LP(a) 3.  Hodgkin's disease status post XRT 4.  Right breast cancer status post mastectomy and chemotherapy 5.  GERD 6.  BMI of 30 7.  Hypothyroidism 8.  Primary peritoneal adenocarcinoma diagnosed 2023; being treated with carboplatin and Taxol 9.  Cardiomyopathy ejection fraction 40 to 45%   October 2023 consultation: The patient is a 57 y.o. female with the indicated medical history here for recommendations regarding elevated calcium score.  Patient had a screening calcium score done in August elevated calcium aortic atherosclerosis.  She was started on rosuvastatin 10 mg.  She is here for further recommendations.   On review of her record it seems that she was diagnosed with peritoneal adenocarcinoma started on chemotherapy recently.  She developed ascites and underwent paracentesis recently.  This is helped her orthopnea and dyspnea somewhat but she is still complaining of this though it very low level.  She denies any exertional angina,  presyncope or syncope.  She has had no bleeding or bruising, unilateral lower extremity edema, black or bloody stools, or signs or symptoms of stroke.  She does not smoke.  She is recently retired from being a Runner, broadcasting/film/video.  Plan: Obtain echocardiogram, start aspirin 81 mg, and refer to pharmacy given intolerance to atorvastatin and rosuvastatin.   November 2023: In the interim the patient had an echocardiogram which showed ejection fraction 40 to 45% with hypokinesis.  She is here to discuss those results.  She is responding to therapy our plans for a few more cycles before potential debulking surgery.  Her breathing is stable.  She denies any exertional angina.  She denies any presyncope or syncope.  She has had no peripheral edema.  She did require a paracentesis October and the fluid has not not reaccumulated.  Overall she is doing fairly well.  Plan: Start Toprol XL 12.5 mg, losartan 12.5 mg, and refer for coronary angiography and right heart catheterization.  Check lipid panel and LP(a).   February 2024: In the interim the patient had coronary angiography and right heart catheterization that were reassuring and showed fairly normal right heart filling pressures.  Her LDL was found to be 282 with LP(a) that was highly elevated.  She was seen by pharmacy and rosuvastatin 5 mg 3 times a week was trialed.  She underwent omentectomy and peritoneal stripping last month which was uncomplicated.  She did have an episode of lightheadedness after she came back home from the hospital.  This has not recurred.  She denies any shortness of breath or chest pain.  She denies any peripheral edema.  She is to undergo 2 more chemotherapy treatments the last being in early March.  Plan: Start Jardiance 10 mg daily check lipid panel and LFTs.  Today: In the interim the patient's lipid panel is not at goal.  She is referred to pharmacy for further management.  She was seen by her oncology team and is tolerating her chemotherapy  well and is doing well.       Previous Medical History: Past Medical History:  Diagnosis Date   Anemia    BRCA2 gene mutation positive 01/08/2019   Breast cancer (HCC) 2007   Left   GERD (gastroesophageal reflux disease)    Hodgkin's disease (HCC) 10/26/1987   clinical   Hypothyroidism    Malignant tumor of breast (HCC)    left   Pneumonia    Thyroid dysfunction    2020, as results on Hodgkin's radiation     Current Medications: No outpatient medications have been marked as taking for the 06/10/23 encounter (Office Visit) with Lauren Pyo, MD.     Allergies:    Patient has no known allergies.   Social History:   Social History   Tobacco Use   Smoking status: Never   Smokeless tobacco: Never  Vaping Use   Vaping status: Never Used  Substance Use Topics   Alcohol use: Yes    Comment: rare   Drug use: Never     Family Hx: Family History  Problem Relation Age of Onset   Prostate cancer Father    Prostate cancer Brother    Colon cancer Maternal Grandmother    Breast cancer Paternal Grandmother    Ovarian cancer Neg Hx    Endometrial cancer Neg Hx    Pancreatic cancer Neg Hx      Review of Systems:   Please see the history of present illness.    All other systems reviewed and are negative.     EKGs/Labs/Other Test Reviewed:   EKG:    EKG Interpretation Date/Time:    Ventricular Rate:    PR Interval:    QRS Duration:    QT Interval:    QTC Calculation:   R Axis:      Text Interpretation:          Prior CV studies reviewed: Cardiac Studies & Procedures   CARDIAC CATHETERIZATION  CARDIAC CATHETERIZATION 10/28/2022  Narrative 1.  Very mild obstructive coronary artery disease of the right coronary artery and left anterior descending arteries. 2.  Relatively normal filling pressures with a mean RA pressure of 4 mmHg, mean wedge pressure of 12 mmHg, and LVEDP of 14 mmHg with a cardiac output by Fick of 6.2 L/min and cardiac index by Fick of  3.5 L/min/m.  Recommendation: Continue medical therapy.  Findings Coronary Findings Diagnostic  Dominance: Right  Left Anterior Descending The vessel exhibits minimal luminal irregularities.  Right Coronary Artery The vessel exhibits minimal luminal irregularities.  Intervention  No interventions have been documented.     ECHOCARDIOGRAM  ECHOCARDIOGRAM COMPLETE 08/12/2022  Narrative ECHOCARDIOGRAM REPORT    Patient Name:   Lauren Chandler Date of Exam: 08/12/2022 Medical Rec #:  [id number]       Height:       63.0 in Accession #:    [id number]      Weight:       171.0 lb Date of Birth:  1966-02-18        BSA:          1.809 m Patient Age:  56 years        BP:           119/63 mmHg Patient Gender: F               HR:           107 bpm. Exam Location:  [address]  Procedure: 2D Echo, Cardiac Doppler and Color Doppler  Indications:    R06.00 Dyspnea  History:        Patient has prior history of Echocardiogram examinations, most recent 03/29/2006. Coronary artery calcification seen on CAT scan. Breast cancer. Hodgkin's disease. Thyroid disease.  Sonographer:    Cathie Beams RCS Sonographer#2:  Clearence Ped RCS Referring Phys: [id number] Lauren Chandler   Sonographer Comments: Image acquisition challenging due to breast implants. IMPRESSIONS   1. Left ventricular ejection fraction, by estimation, is 40 to 45%. The left ventricle has mildly decreased function. The left ventricle demonstrates global hypokinesis. Left ventricular diastolic parameters are indeterminate. 2. Right ventricular systolic function is normal. The right ventricular size is normal. 3. The mitral valve is normal in structure. Mild mitral valve regurgitation. No evidence of mitral stenosis. 4. The aortic valve is normal in structure. Aortic valve regurgitation is mild to moderate. Aortic valve sclerosis/calcification is present, without any evidence of aortic stenosis. 5. The inferior vena cava  is normal in size with greater than 50% respiratory variability, suggesting right atrial pressure of 3 mmHg.  FINDINGS Left Ventricle: Left ventricular ejection fraction, by estimation, is 40 to 45%. The left ventricle has mildly decreased function. The left ventricle demonstrates global hypokinesis. The left ventricular internal cavity size was normal in size. There is no left ventricular hypertrophy. Left ventricular diastolic parameters are indeterminate.  Right Ventricle: The right ventricular size is normal. No increase in right ventricular wall thickness. Right ventricular systolic function is normal.  Left Atrium: Left atrial size was normal in size.  Right Atrium: Right atrial size was normal in size.  Pericardium: There is no evidence of pericardial effusion.  Mitral Valve: The mitral valve is normal in structure. Mild mitral annular calcification. Mild mitral valve regurgitation. No evidence of mitral valve stenosis.  Tricuspid Valve: The tricuspid valve is normal in structure. Tricuspid valve regurgitation is mild . No evidence of tricuspid stenosis.  Aortic Valve: The aortic valve is normal in structure. There is mild to moderate aortic valve annular calcification. Aortic valve regurgitation is mild to moderate. Aortic valve sclerosis/calcification is present, without any evidence of aortic stenosis.  Pulmonic Valve: The pulmonic valve was not well visualized. Pulmonic valve regurgitation is trivial. No evidence of pulmonic stenosis.  Aorta: The aortic root is normal in size and structure.  Venous: The inferior vena cava is normal in size with greater than 50% respiratory variability, suggesting right atrial pressure of 3 mmHg.  IAS/Shunts: No atrial level shunt detected by color flow Doppler.   LEFT VENTRICLE PLAX 2D LVIDd:         4.60 cm   Diastology LVIDs:         3.90 cm   LV e' medial:    5.77 cm/s LV PW:         0.80 cm   LV E/e' medial:  12.5 LV IVS:        0.80  cm   LV e' lateral:   5.22 cm/s LVOT diam:     2.10 cm   LV E/e' lateral: 13.9 LV SV:         43  LV SV Index:   24 LVOT Area:     3.46 cm   RIGHT VENTRICLE RV S prime:     10.90 cm/s TAPSE (M-mode): 1.8 cm  LEFT ATRIUM             Index LA Vol (A2C):   22.6 ml 12.49 ml/m LA Vol (A4C):   20.5 ml 11.33 ml/m LA Biplane Vol: 22.7 ml 12.55 ml/m AORTIC VALVE LVOT Vmax:   80.70 cm/s LVOT Vmean:  54.000 cm/s LVOT VTI:    0.123 m  AORTA Ao Root diam: 3.10 cm  MITRAL VALVE MV Area (PHT): 5.62 cm    SHUNTS MV Decel Time: 135 msec    Systemic VTI:  0.12 m MV E velocity: 72.30 cm/s  Systemic Diam: 2.10 cm MV A velocity: 94.30 cm/s MV E/A ratio:  0.77  Kardie Tobb DO Electronically signed by Thomasene Ripple DO Signature Date/Time: 08/12/2022/2:40:59 PM    Final             Recent Labs: 11/18/2022: Magnesium 1.4 04/07/2023: TSH 4.056 06/07/2023: ALT 14; BUN 20; Creatinine, Ser 1.12; Hemoglobin 11.4; Platelets 375; Potassium 3.8; Sodium 138   Lipid Panel    Component Value Date/Time   CHOL 203 (H) 02/25/2023 0916   TRIG 174 (H) 02/25/2023 0916   HDL 51 02/25/2023 0916   CHOLHDL 4.0 02/25/2023 0916   LDLCALC 121 (H) 02/25/2023 0916    Risk Assessment/Calculations:         Physical Exam:     Signed, Lauren Pyo, MD  06/10/2023 10:33 AM    [facility] [address] [place], [place], [place]  [address] Phone: [phone]; Fax: [phone]   Note:  This document was prepared using Dragon voice recognition software and may include unintentional dictation errors.

## 2023-06-07 ENCOUNTER — Encounter: Payer: Self-pay | Admitting: Hematology and Oncology

## 2023-06-07 ENCOUNTER — Other Ambulatory Visit: Payer: Self-pay

## 2023-06-07 ENCOUNTER — Inpatient Hospital Stay: Payer: BC Managed Care – PPO | Attending: Gynecologic Oncology

## 2023-06-07 ENCOUNTER — Inpatient Hospital Stay (HOSPITAL_BASED_OUTPATIENT_CLINIC_OR_DEPARTMENT_OTHER): Payer: BC Managed Care – PPO | Admitting: Hematology and Oncology

## 2023-06-07 VITALS — BP 114/64 | HR 104 | Temp 97.4°F | Resp 18 | Ht 62.21 in | Wt 168.4 lb

## 2023-06-07 DIAGNOSIS — D6481 Anemia due to antineoplastic chemotherapy: Secondary | ICD-10-CM

## 2023-06-07 DIAGNOSIS — R7989 Other specified abnormal findings of blood chemistry: Secondary | ICD-10-CM

## 2023-06-07 DIAGNOSIS — Z1501 Genetic susceptibility to malignant neoplasm of breast: Secondary | ICD-10-CM | POA: Diagnosis not present

## 2023-06-07 DIAGNOSIS — T451X5A Adverse effect of antineoplastic and immunosuppressive drugs, initial encounter: Secondary | ICD-10-CM | POA: Diagnosis not present

## 2023-06-07 DIAGNOSIS — C482 Malignant neoplasm of peritoneum, unspecified: Secondary | ICD-10-CM | POA: Diagnosis not present

## 2023-06-07 DIAGNOSIS — Z79899 Other long term (current) drug therapy: Secondary | ICD-10-CM | POA: Insufficient documentation

## 2023-06-07 DIAGNOSIS — Z1509 Genetic susceptibility to other malignant neoplasm: Secondary | ICD-10-CM | POA: Diagnosis not present

## 2023-06-07 LAB — COMPREHENSIVE METABOLIC PANEL
ALT: 14 U/L (ref 0–44)
AST: 17 U/L (ref 15–41)
Albumin: 4 g/dL (ref 3.5–5.0)
Alkaline Phosphatase: 72 U/L (ref 38–126)
Anion gap: 7 (ref 5–15)
BUN: 20 mg/dL (ref 6–20)
CO2: 27 mmol/L (ref 22–32)
Calcium: 9.2 mg/dL (ref 8.9–10.3)
Chloride: 104 mmol/L (ref 98–111)
Creatinine, Ser: 1.12 mg/dL — ABNORMAL HIGH (ref 0.44–1.00)
GFR, Estimated: 57 mL/min — ABNORMAL LOW (ref >60)
Glucose, Bld: 110 mg/dL — ABNORMAL HIGH (ref 70–99)
Potassium: 3.8 mmol/L (ref 3.5–5.1)
Sodium: 138 mmol/L (ref 135–145)
Total Bilirubin: 0.4 mg/dL (ref 0.3–1.2)
Total Protein: 7.8 g/dL (ref 6.5–8.1)

## 2023-06-07 LAB — CBC WITH DIFFERENTIAL/PLATELET
Abs Immature Granulocytes: 0.03 10*3/uL (ref 0.00–0.07)
Basophils Absolute: 0.1 10*3/uL (ref 0.0–0.1)
Basophils Relative: 1 %
Eosinophils Absolute: 0.3 10*3/uL (ref 0.0–0.5)
Eosinophils Relative: 3 %
HCT: 33.4 % — ABNORMAL LOW (ref 36.0–46.0)
Hemoglobin: 11.4 g/dL — ABNORMAL LOW (ref 12.0–15.0)
Immature Granulocytes: 0 %
Lymphocytes Relative: 27 %
Lymphs Abs: 2.5 10*3/uL (ref 0.7–4.0)
MCH: 34.1 pg — ABNORMAL HIGH (ref 26.0–34.0)
MCHC: 34.1 g/dL (ref 30.0–36.0)
MCV: 100 fL (ref 80.0–100.0)
Monocytes Absolute: 0.9 10*3/uL (ref 0.1–1.0)
Monocytes Relative: 9 %
Neutro Abs: 5.5 10*3/uL (ref 1.7–7.7)
Neutrophils Relative %: 60 %
Platelets: 375 10*3/uL (ref 150–400)
RBC: 3.34 MIL/uL — ABNORMAL LOW (ref 3.87–5.11)
RDW: 19.5 % — ABNORMAL HIGH (ref 11.5–15.5)
WBC: 9.2 10*3/uL (ref 4.0–10.5)
nRBC: 9.3 % — ABNORMAL HIGH (ref 0.0–0.2)

## 2023-06-07 MED ORDER — HEPARIN SOD (PORK) LOCK FLUSH 100 UNIT/ML IV SOLN
500.0000 [IU] | Freq: Once | INTRAVENOUS | Status: AC
  Administered 2023-06-07: 500 [IU]

## 2023-06-07 MED ORDER — SODIUM CHLORIDE 0.9% FLUSH
10.0000 mL | Freq: Once | INTRAVENOUS | Status: AC
  Administered 2023-06-07: 10 mL

## 2023-06-07 NOTE — Progress Notes (Signed)
[facility] OFFICE PROGRESS NOTE  Patient Care Team: [name], MD as PCP - General (Internal Medicine) [name], MD as PCP - Cardiology (Cardiology) Charna Elizabeth, MD as Consulting Physician (Gastroenterology)  ASSESSMENT & PLAN:  Primary peritoneal adenocarcinoma (HCC) Overall, she tolerated Garey Ham well without major side effects  We will proceed with treatment without delay Plan to see her again in 2 months for further follow-up Plan to repeat imaging in October  Anemia due to antineoplastic chemotherapy She has stable anemia with hemoglobin around 11 I believe this is her baseline Observe only  Elevated serum creatinine This is improved compared to previous visit Observe only  Orders Placed This Encounter  Procedures   CT CHEST ABDOMEN PELVIS W CONTRAST    No need oral contrast    Standing Status:   Future    Standing Expiration Date:   06/06/2024    Scheduling Instructions:     No need oral contrast    Order Specific Question:   If indicated for the ordered procedure, I authorize the administration of contrast media per Radiology protocol    Answer:   Yes    Order Specific Question:   Does the patient have a contrast media/X-ray dye allergy?    Answer:   No    Order Specific Question:   Preferred imaging location?    Answer:   [facility]    Order Specific Question:   If indicated for the ordered procedure, I authorize the administration of oral contrast media per Radiology protocol    Answer:   No    Order Specific Question:   Reason for no oral contrast:    Answer:   no need    Order Specific Question:   Is patient pregnant?    Answer:   No    All questions were answered. The patient knows to call the clinic with any problems, questions or concerns. The total time spent in the appointment was 20 minutes encounter with patients including review of chart and various tests results, discussions about plan of care and coordination of  care plan   Artis Delay, MD 06/07/2023 11:42 AM  INTERVAL HISTORY: Please see below for problem oriented charting. she returns for treatment follow-up on olaparib She is still bothered by internal stitches along the incision scar Otherwise, she tolerated treatment well Denies nausea or changes in bowel habits  REVIEW OF SYSTEMS:   Constitutional: Denies fevers, chills or abnormal weight loss Eyes: Denies blurriness of vision Ears, nose, mouth, throat, and face: Denies mucositis or sore throat Respiratory: Denies cough, dyspnea or wheezes Cardiovascular: Denies palpitation, chest discomfort or lower extremity swelling Gastrointestinal:  Denies nausea, heartburn or change in bowel habits Skin: Denies abnormal skin rashes Lymphatics: Denies new lymphadenopathy or easy bruising Neurological:Denies numbness, tingling or new weaknesses Behavioral/Psych: Mood is stable, no new changes  All other systems were reviewed with the patient and are negative.  I have reviewed the past medical history, past surgical history, social history and family history with the patient and they are unchanged from previous note.  ALLERGIES:  has No Known Allergies.  MEDICATIONS:  Current Outpatient Medications  Medication Sig Dispense Refill   aspirin 81 MG chewable tablet Chew 81 mg by mouth daily.     Cholecalciferol (DIALYVITE VITAMIN D 5000) 125 MCG (5000 UT) capsule Take 5,000 Units by mouth every evening.     levothyroxine (SYNTHROID) 125 MCG tablet Take 125 mcg by mouth daily before breakfast.  olaparib (LYNPARZA) 100 MG tablet TAKE 2 TABLETS (200 MG TOTAL) TWICE A DAY. SWALLOW WHOLE. MAY TAKE WITH FOOD TO DECREASE NAUSEA AND VOMITING. 120 tablet 9   pantoprazole (PROTONIX) 40 MG tablet Take 40 mg by mouth daily before breakfast.     rosuvastatin (CRESTOR) 5 MG tablet Take 1 tablet (5 mg total) by mouth daily. 90 tablet 3   vitamin B-12 (CYANOCOBALAMIN) 500 MCG tablet Take 500 mcg by mouth every  evening.     No current facility-administered medications for this visit.    SUMMARY OF ONCOLOGIC HISTORY: Oncology History Overview Note  BRCA2 positive   Primary peritoneal adenocarcinoma (HCC)  07/19/2022 Imaging   1. Small left pleural effusion  2. 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HCC)   07/23/2022 Cancer Staging   Staging form: Ovary, Fallopian Tube, and Primary Peritoneal Carcinoma, AJCC 8th Edition - Clinical stage from 07/23/2022: FIGO Stage IIIC (cT3c, cN0, cM0) - Signed by Artis Delay, MD on 07/23/2022 Stage prefix: Initial diagnosis   07/23/2022 Pathology Results   FINAL MICROSCOPIC DIAGNOSIS:  - Malignant cells present  - See comment   SPECIMEN ADEQUACY:  Satisfactory for evaluation   DIAGNOSTIC COMMENTS:  Immunohistochemical stains show that the tumor cells are positive for ER, PAX8, p16 and p53 (clonal overexpression pattern), consistent with a high-grade serous carcinoma.  Immunostain for WT1 is also diffusely positive in the tumor cells, most consistent with an ovarian primary.  Dr. Kenyon Ana reviewed the case and concurs with the above diagnosis.    07/30/2022 Procedure   Successful right chest port placement via the right internal jugular vein. The port is ready for immediate use.   08/02/2022 - 12/28/2022 Chemotherapy   Patient is on Treatment Plan : OVARIAN Carboplatin (AUC 6) + Paclitaxel (175) q21d X 6 Cycles     08/03/2022 Procedure   Successful ultrasound-guided paracentesis yielding 4.1 liters of peritoneal fluid.   08/09/2022 Tumor Marker   Patient's tumor was tested for the following markers: CA-125. Results of the tumor marker test revealed 452.   08/12/2022 Echocardiogram    1. Left ventricular ejection fraction, by estimation, is 40 to 45%. The left ventricle has mildly decreased function. The left ventricle demonstrates global hypokinesis. Left ventricular  diastolic parameters are indeterminate.   2. Right ventricular systolic function is normal. The right ventricular size is normal.   3. The mitral valve is normal in structure. Mild mitral valve regurgitation. No evidence of mitral stenosis.   4. The aortic valve is normal in structure. Aortic valve regurgitation is mild to moderate. Aortic valve sclerosis/calcification is present, without any evidence of aortic stenosis.   5. The inferior vena cava is normal in size with greater than 50% respiratory variability, suggesting right atrial pressure of 3 mmHg.    09/23/2022 Tumor Marker   Patient's tumor was tested for the following markers: CA-125. Results of the tumor marker test revealed 41.3.   10/07/2022 Imaging   1. Compared to the outside abdominopelvic CT of 07/19/2022, significant improvement in peritoneal carcinomatosis, without bowel obstruction or other acute complication. 2. Decreased size of upper abdominal nodes, possibly also representing response to therapy. 3. No new or progressive disease and no evidence of thoracic metastasis. 4. Bladder wall thickening and mucosal hyperenhancement are suspicious for cystitis. 5. 4 mm left upper lobe pulmonary nodule is most likely benign/incidental but can be re-evaluated at follow-up. 6. Aortic valvular calcifications. Consider echocardiography to evaluate for valvular  dysfunction. 7. Coronary artery atherosclerosis. Aortic Atherosclerosis (ICD10-I70.0).     11/11/2022 Surgery   Exploratory laparotomy, lysis of adhesions for approximately 45 minutes, total omentectomy with radical tumor debulking including resection of perigastric nodule, excision and fulguration of multiple small bowel mesenteric nodules, resection of multiple sigmoid epiploica, fulguration of peritoneal lesions along bilateral pelvic sidewalls, peritoneal stripping of bladder peritoneum   Findings: On bimanual exam, no masses appreciated at the vaginal cuff, no nodularity.   On intra-abdominal entry, infracolic omentum retracted and involved by residual tumor implant measuring approximately 10 x 4 cm.  Nodularity palpated almost up to the splenic flexure.  Supracolic omentum involved to within 2 cm of the greater curvature of the stomach.  Additional 2 x 3 cm implant noted lateral and inferior to the gastric pylorus (sitting anterior to the duodenum and just inferior to the gallbladder).  Ascending and transverse colon normal in appearance.  Small bowel run from the ileocecal valve to the ligament of Treitz.  Rare small nodules within the mesentery noted.  These were all either excised or fulgurated.  Right aspect of the liver and diaphragm smooth to palpation, no visible lesions.  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  No ascites.  Within the pelvis, minimal nodularity seen along bilateral pelvic sidewalls near the infundibulopelvic remnants, suspected to be related to prior hysterectomy and BSO.  Several sigmoid epiploica with tumor implants versus treated tumor, all excised fulgurated.  No nodularity within the deep pelvis although some evidence of what appeared to be treated tumor versus tumor rind involving the bladder peritoneum was stripped in this location.  Biopsies taken from the posterior cul-de-sac. R0 resection at the end of surgery.   11/11/2022 Pathology Results   A. SMALL BOWEL MESENTARY NODULES: -  Densely fibrotic soft tissue/scar with abundant foamy histiocytes, negative for malignancy.  B. 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  C. PERIGASTRIC IMPLANTS, EXCISION: -  High-grade serous carcinoma.  D. SIGMOID EPIPLOICA, EXCISION: High-grade serous carcinoma in the background of dense  stromal fibrosis/scar, calcifications and foamy histiocytes.  E. BLADDER PERITONEUM, EXCISION: -  High-grade serous carcinoma.  F. CUL DE SAC, POSTERIOR, BIOPSY: -  Focal high-grade serous carcinoma in the background of predominantly broad and foamy histiocytes.  Note: It is noted that the patient had a diagnosis of high-grade serous carcinoma from a cytology of the ascites in September 2023 and was clinically staged as cT3c.  Patient is now status post neoadjuvant chemotherapy.  It is noted that this is a debulking surgery.  No additional testing is performed to reserve tissue for clinician directed testing.    12/08/2022 Tumor Marker   Patient's tumor was tested for the following markers: CA-125. Results of the tumor marker test revealed 10.2.   02/03/2023 Imaging   1. Compared to 10/06/2022, interval omentectomy and peritoneal stripping. No residual omental thickening/nodularity. Scattered areas of mesenteric stranding may be postsurgical. 2. No new metastatic disease in the abdomen or pelvis. 3. Unchanged 5 mm enhancing focus along the anterior pancreatic body/tail compared to 07/19/2022. Differential includes intrapancreatic spleen or neoplasm, including small neuroendocrine tumor. Given the size of this lesion, evaluation by MRI would likely be suboptimal. 4. Aortic Atherosclerosis (ICD10-I70.0). Coronary artery calcifications. Assessment for potential risk factor modification, dietary therapy or pharmacologic therapy may be warranted, if clinically indicated.  02/03/2023 Tumor Marker   Patient's tumor was tested for the following markers: CA-125. Results of the tumor marker test revealed 7.8.   02/14/2023 -  Chemotherapy   She started taking olaparib   03/10/2023 Tumor Marker   Patient's tumor was tested for the following markers: CA-125. Results of the tumor marker test revealed 7.6.   04/08/2023 Tumor Marker   Patient's tumor was tested for the following markers:  CA-125. Results of the tumor marker test revealed 8.8.     PHYSICAL EXAMINATION: ECOG PERFORMANCE STATUS: 0 - Asymptomatic  Vitals:   06/07/23 0955  BP: 114/64  Pulse: (!) 104  Resp: 18  Temp: (!) 97.4 F (36.3 C)  SpO2: 98%   Filed Weights   06/07/23 0955  Weight: 168 lb 6.4 oz (76.4 kg)    GENERAL:alert, no distress and comfortable SKIN: skin color, texture, turgor are normal, no rashes or significant lesions EYES: normal, Conjunctiva are pink and non-injected, sclera clear OROPHARYNX:no exudate, no erythema and lips, buccal mucosa, and tongue normal  NECK: supple, thyroid normal size, non-tender, without nodularity LYMPH:  no palpable lymphadenopathy in the cervical, axillary or inguinal LUNGS: clear to auscultation and percussion with normal breathing effort HEART: regular rate & rhythm and no murmurs and no lower extremity edema ABDOMEN:abdomen soft, non-tender and normal bowel sounds.  Noted thickening along the incision scar Musculoskeletal:no cyanosis of digits and no clubbing  NEURO: alert & oriented x 3 with fluent speech, no focal motor/sensory deficits  LABORATORY DATA:  I have reviewed the data as listed    Component Value Date/Time   NA 138 06/07/2023 0924   K 3.8 06/07/2023 0924   CL 104 06/07/2023 0924   CO2 27 06/07/2023 0924   GLUCOSE 110 (H) 06/07/2023 0924   BUN 20 06/07/2023 0924   CREATININE 1.12 (H) 06/07/2023 0924   CREATININE 0.94 12/28/2022 0900   CALCIUM 9.2 06/07/2023 0924   PROT 7.8 06/07/2023 0924   PROT 7.4 02/25/2023 0916   ALBUMIN 4.0 06/07/2023 0924   ALBUMIN 3.9 02/25/2023 0916   AST 17 06/07/2023 0924   AST 18 12/28/2022 0900   ALT 14 06/07/2023 0924   ALT 18 12/28/2022 0900   ALKPHOS 72 06/07/2023 0924   BILITOT 0.4 06/07/2023 0924   BILITOT <0.2 02/25/2023 0916   BILITOT 0.2 (L) 12/28/2022 0900   GFRNONAA 57 (L) 06/07/2023 0924   GFRNONAA >60 12/28/2022 0900    No results found for: "SPEP", "UPEP"  Lab Results   Component Value Date   WBC 9.2 06/07/2023   NEUTROABS 5.5 06/07/2023   HGB 11.4 (L) 06/07/2023   HCT 33.4 (L) 06/07/2023   MCV 100.0 06/07/2023   PLT 375 06/07/2023      Chemistry      Component Value Date/Time   NA 138 06/07/2023 0924   K 3.8 06/07/2023 0924   CL 104 06/07/2023 0924   CO2 27 06/07/2023 0924   BUN 20 06/07/2023 0924   CREATININE 1.12 (H) 06/07/2023 0924   CREATININE 0.94 12/28/2022 0900      Component Value Date/Time   CALCIUM 9.2 06/07/2023 0924   ALKPHOS 72 06/07/2023 0924   AST 17 06/07/2023 0924   AST 18 12/28/2022 0900   ALT 14 06/07/2023 0924   ALT 18 12/28/2022 0900   BILITOT 0.4 06/07/2023 0924   BILITOT <0.2 02/25/2023 0916   BILITOT 0.2 (L) 12/28/2022 0900

## 2023-06-07 NOTE — Assessment & Plan Note (Signed)
Overall, she tolerated Lynparza well without major side effects  We will proceed with treatment without delay Plan to see her again in 2 months for further follow-up Plan to repeat imaging in October

## 2023-06-07 NOTE — Assessment & Plan Note (Signed)
This is improved compared to previous visit Observe only

## 2023-06-07 NOTE — Assessment & Plan Note (Signed)
She has stable anemia with hemoglobin around 11 I believe this is her baseline Observe only

## 2023-06-10 ENCOUNTER — Encounter: Payer: Self-pay | Admitting: Internal Medicine

## 2023-06-10 ENCOUNTER — Ambulatory Visit: Payer: BC Managed Care – PPO | Attending: Internal Medicine | Admitting: Internal Medicine

## 2023-06-10 DIAGNOSIS — I2510 Atherosclerotic heart disease of native coronary artery without angina pectoris: Secondary | ICD-10-CM

## 2023-06-10 DIAGNOSIS — I700 Atherosclerosis of aorta: Secondary | ICD-10-CM

## 2023-06-10 DIAGNOSIS — C482 Malignant neoplasm of peritoneum, unspecified: Secondary | ICD-10-CM

## 2023-06-10 DIAGNOSIS — I428 Other cardiomyopathies: Secondary | ICD-10-CM

## 2023-07-06 ENCOUNTER — Encounter: Payer: Self-pay | Admitting: Gynecologic Oncology

## 2023-07-07 NOTE — Progress Notes (Signed)
Gynecologic Oncology Return Clinic Visit  07/08/23  Reason for Visit: surveillance   Treatment History: Oncology History Overview Note  BRCA2 positive   Primary peritoneal adenocarcinoma (HCC)  07/19/2022 Imaging   1. Small left pleural effusion  2. 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HCC)   07/23/2022 Cancer Staging   Staging form: Ovary, Fallopian Tube, and Primary Peritoneal Carcinoma, AJCC 8th Edition - Clinical stage from 07/23/2022: FIGO Stage IIIC (cT3c, cN0, cM0) - Signed by Artis Delay, MD on 07/23/2022 Stage prefix: Initial diagnosis   07/23/2022 Pathology Results   FINAL MICROSCOPIC DIAGNOSIS:  - Malignant cells present  - See comment   SPECIMEN ADEQUACY:  Satisfactory for evaluation   DIAGNOSTIC COMMENTS:  Immunohistochemical stains show that the tumor cells are positive for ER, PAX8, p16 and p53 (clonal overexpression pattern), consistent with a high-grade serous carcinoma.  Immunostain for WT1 is also diffusely positive in the tumor cells, most consistent with an ovarian primary.  Dr. Kenyon Ana reviewed the case and concurs with the above diagnosis.    07/30/2022 Procedure   Successful right chest port placement via the right internal jugular vein. The port is ready for immediate use.   08/02/2022 - 12/28/2022 Chemotherapy   Patient is on Treatment Plan : OVARIAN Carboplatin (AUC 6) + Paclitaxel (175) q21d X 6 Cycles     08/03/2022 Procedure   Successful ultrasound-guided paracentesis yielding 4.1 liters of peritoneal fluid.   08/09/2022 Tumor Marker   Patient's tumor was tested for the following markers: CA-125. Results of the tumor marker test revealed 452.   08/12/2022 Echocardiogram    1. Left ventricular ejection fraction, by estimation, is 40 to 45%. The left ventricle has mildly decreased function. The left ventricle demonstrates global hypokinesis. Left ventricular  diastolic parameters are indeterminate.   2. Right ventricular systolic function is normal. The right ventricular size is normal.   3. The mitral valve is normal in structure. Mild mitral valve regurgitation. No evidence of mitral stenosis.   4. The aortic valve is normal in structure. Aortic valve regurgitation is mild to moderate. Aortic valve sclerosis/calcification is present, without any evidence of aortic stenosis.   5. The inferior vena cava is normal in size with greater than 50% respiratory variability, suggesting right atrial pressure of 3 mmHg.    09/23/2022 Tumor Marker   Patient's tumor was tested for the following markers: CA-125. Results of the tumor marker test revealed 41.3.   10/07/2022 Imaging   1. Compared to the outside abdominopelvic CT of 07/19/2022, significant improvement in peritoneal carcinomatosis, without bowel obstruction or other acute complication. 2. Decreased size of upper abdominal nodes, possibly also representing response to therapy. 3. No new or progressive disease and no evidence of thoracic metastasis. 4. Bladder wall thickening and mucosal hyperenhancement are suspicious for cystitis. 5. 4 mm left upper lobe pulmonary nodule is most likely benign/incidental but can be re-evaluated at follow-up. 6. Aortic valvular calcifications. Consider echocardiography to evaluate for valvular dysfunction. 7. Coronary artery atherosclerosis. Aortic Atherosclerosis (ICD10-I70.0).     11/11/2022 Surgery   Exploratory laparotomy, lysis of adhesions for approximately 45 minutes, total omentectomy with radical tumor debulking including resection of perigastric nodule, excision and fulguration of multiple small bowel mesenteric nodules, resection of multiple sigmoid epiploica, fulguration of peritoneal lesions along bilateral pelvic sidewalls, peritoneal stripping of bladder peritoneum   Findings: On bimanual exam, no masses appreciated at the vaginal cuff, no nodularity.   On intra-abdominal  entry, infracolic omentum retracted and involved by residual tumor implant measuring approximately 10 x 4 cm.  Nodularity palpated almost up to the splenic flexure.  Supracolic omentum involved to within 2 cm of the greater curvature of the stomach.  Additional 2 x 3 cm implant noted lateral and inferior to the gastric pylorus (sitting anterior to the duodenum and just inferior to the gallbladder).  Ascending and transverse colon normal in appearance.  Small bowel run from the ileocecal valve to the ligament of Treitz.  Rare small nodules within the mesentery noted.  These were all either excised or fulgurated.  Right aspect of the liver and diaphragm smooth to palpation, no visible lesions.  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  No ascites.  Within the pelvis, minimal nodularity seen along bilateral pelvic sidewalls near the infundibulopelvic remnants, suspected to be related to prior hysterectomy and BSO.  Several sigmoid epiploica with tumor implants versus treated tumor, all excised fulgurated.  No nodularity within the deep pelvis although some evidence of what appeared to be treated tumor versus tumor rind involving the bladder peritoneum was stripped in this location.  Biopsies taken from the posterior cul-de-sac. R0 resection at the end of surgery.   11/11/2022 Pathology Results   A. SMALL BOWEL MESENTARY NODULES: -  Densely fibrotic soft tissue/scar with abundant foamy histiocytes, negative for malignancy.  B. 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  C. PERIGASTRIC IMPLANTS, EXCISION: -  High-grade serous carcinoma.  D. SIGMOID EPIPLOICA, EXCISION: High-grade serous carcinoma in the background of dense  stromal fibrosis/scar, calcifications and foamy histiocytes.  E. BLADDER PERITONEUM, EXCISION: -  High-grade serous carcinoma.  F. CUL DE SAC, POSTERIOR, BIOPSY: -  Focal high-grade serous carcinoma in the background of predominantly broad and foamy histiocytes.  Note: It is noted that the patient had a diagnosis of high-grade serous carcinoma from a cytology of the ascites in September 2023 and was clinically staged as cT3c.  Patient is now status post neoadjuvant chemotherapy.  It is noted that this is a debulking surgery.  No additional testing is performed to reserve tissue for clinician directed testing.    12/08/2022 Tumor Marker   Patient's tumor was tested for the following markers: CA-125. Results of the tumor marker test revealed 10.2.   02/03/2023 Imaging   1. Compared to 10/06/2022, interval omentectomy and peritoneal stripping. No residual omental thickening/nodularity. Scattered areas of mesenteric stranding may be postsurgical. 2. No new metastatic disease in the abdomen or pelvis. 3. Unchanged 5 mm enhancing focus along the anterior pancreatic body/tail compared to 07/19/2022. Differential includes intrapancreatic spleen or neoplasm, including small neuroendocrine tumor. Given the size of this lesion, evaluation by MRI would likely be suboptimal. 4. Aortic Atherosclerosis (ICD10-I70.0). Coronary artery calcifications. Assessment for potential risk factor modification, dietary therapy or pharmacologic therapy may be warranted, if clinically indicated.   02/03/2023 Tumor Marker   Patient's tumor was tested for the following markers: CA-125. Results of the tumor marker test revealed 7.8.   02/14/2023 -  Chemotherapy   She started taking olaparib   03/10/2023 Tumor Marker   Patient's tumor was tested for the following markers: CA-125. Results of the tumor marker test revealed 7.6.   04/08/2023 Tumor Marker   Patient's tumor was tested for the following markers:  CA-125. Results of the tumor marker test  revealed 8.8.   06/08/2023 Tumor Marker   Patient's tumor was tested for the following markers: CA-125. Results of the tumor marker test revealed 9.4.     Interval History: Doing well.  Denies any abdominal or pelvic pain.  Tolerating PARPi without significant symptoms.  Reports normal bowel and bladder function.  Denies any vaginal bleeding or discharge.  She has occasional firmness along her incision just lateral to her umbilicus.  Past Medical/Surgical History: Past Medical History:  Diagnosis Date   Anemia    BRCA2 gene mutation positive 01/08/2019   Breast cancer (HCC) 2007   Left   GERD (gastroesophageal reflux disease)    Hodgkin's disease (HCC) 10/26/1987   clinical   Hypothyroidism    Malignant tumor of breast (HCC)    left   Pneumonia    Thyroid dysfunction    2020, as results on Hodgkin's radiation    Past Surgical History:  Procedure Laterality Date   BREAST RECONSTRUCTION     2009   COLONOSCOPY     2017   DEBULKING N/A 11/11/2022   Procedure: TUMOR DEBULKING;  Surgeon: Carver Fila, MD;  Location: [facility];  Service: Gynecology;  Laterality: N/A;   excisional of bilateral breast     2007   Hysteroscopy w.biopsy     2012   IR IMAGING GUIDED PORT INSERTION  07/29/2022   IR PARACENTESIS  07/23/2022   KNEE ARTHROSCOPY     1985   laparoscopically assisted vaginal hysterectomy w/removal of tubes and/or ovaries  04/23/2019   LYMPH NODE BIOPSY  1989   MASTECTOMY Bilateral 2007   OMENTECTOMY N/A 11/11/2022   Procedure: OPEN OMENTECTOMY, PERITONEAL STRIPPING,  LYSIS OF ADHESIONS;  Surgeon: Carver Fila, MD;  Location: [facility];  Service: Gynecology;  Laterality: N/A;   removal of spleen total     1989   RIGHT/LEFT HEART CATH AND CORONARY ANGIOGRAPHY N/A 10/28/2022   Procedure: RIGHT/LEFT HEART CATH AND CORONARY ANGIOGRAPHY;  Surgeon: [name], MD;  Location: [facility];  Service: Cardiovascular;   Laterality: N/A;    Family History  Problem Relation Age of Onset   Prostate cancer Father    Prostate cancer Brother    Colon cancer Maternal Grandmother    Breast cancer Paternal Grandmother    Ovarian cancer Neg Hx    Endometrial cancer Neg Hx    Pancreatic cancer Neg Hx     Social History   Socioeconomic History   Marital status: Married    Spouse name: Not on file   Number of children: Not on file   Years of education: Not on file   Highest education level: Not on file  Occupational History   Occupation: retired  Tobacco Use   Smoking status: Never   Smokeless tobacco: Never  Vaping Use   Vaping status: Never Used  Substance and Sexual Activity   Alcohol use: Yes    Comment: rare   Drug use: Never   Sexual activity: Yes  Other Topics Concern   Not on file  Social History Narrative   Not on file   Social Determinants of Health   Financial Resource Strain: Low Risk  (08/02/2022)   Overall Financial Resource Strain (CARDIA)    Difficulty of Paying Living Expenses: Not very hard  Food Insecurity: No Food Insecurity (11/11/2022)   Hunger Vital Sign    Worried About Running Out of Food in the Last Year: Never true    Ran Out of Food in the  Last Year: Never true  Transportation Needs: No Transportation Needs (11/11/2022)   PRAPARE - Administrator, Civil Service (Medical): No    Lack of Transportation (Non-Medical): No  Physical Activity: Not on file  Stress: Not on file  Social Connections: Unknown (07/19/2022)   Received from [facility]   Social Network    Social Network: Not on file    Current Medications:  Current Outpatient Medications:    aspirin 81 MG chewable tablet, Chew 81 mg by mouth daily., Disp: , Rfl:    levothyroxine (SYNTHROID) 125 MCG tablet, Take 125 mcg by mouth daily before breakfast., Disp: , Rfl:    olaparib (LYNPARZA) 100 MG tablet, TAKE 2 TABLETS (200 MG TOTAL) TWICE A DAY. SWALLOW WHOLE. MAY TAKE WITH  FOOD TO DECREASE NAUSEA AND VOMITING., Disp: 120 tablet, Rfl: 9   pantoprazole (PROTONIX) 40 MG tablet, Take 40 mg by mouth daily before breakfast., Disp: , Rfl:    rosuvastatin (CRESTOR) 5 MG tablet, Take 1 tablet (5 mg total) by mouth daily., Disp: 90 tablet, Rfl: 3   vitamin B-12 (CYANOCOBALAMIN) 500 MCG tablet, Take 500 mcg by mouth every evening., Disp: , Rfl:   Review of Systems: Denies appetite changes, fevers, chills, fatigue, unexplained weight changes. Denies hearing loss, neck lumps or masses, mouth sores, ringing in ears or voice changes. Denies cough or wheezing.  Denies shortness of breath. Denies chest pain or palpitations. Denies leg swelling. Denies abdominal distention, pain, blood in stools, constipation, diarrhea, nausea, vomiting, or early satiety. Denies pain with intercourse, dysuria, frequency, hematuria or incontinence. Denies hot flashes, pelvic pain, vaginal bleeding or vaginal discharge.   Denies joint pain, back pain or muscle pain/cramps. Denies itching, rash, or wounds. Denies dizziness, headaches, numbness or seizures. Denies swollen lymph nodes or glands, denies easy bruising or bleeding. Denies anxiety, depression, confusion, or decreased concentration.  Physical Exam: BP 112/68 (BP Location: Left Arm, Patient Position: Sitting)   Pulse (!) 102   Temp 98.2 F (36.8 C) (Oral)   Resp 18   Wt 172 lb 6.6 oz (78.2 kg)   SpO2 97%   BMI 31.32 kg/m  General: Alert, oriented, no acute distress. HEENT: Normocephalic, atraumatic, sclera anicteric. Chest: Clear to auscultation bilaterally.  No wheezes or rhonchi. Cardiovascular: Regular rate and rhythm, no murmurs. Abdomen: soft, nontender.  Normoactive bowel sounds.  No masses or hepatosplenomegaly appreciated.   Extremities: Grossly normal range of motion.  Warm, well perfused.  No edema bilaterally. Skin: No rashes or lesions noted. Lymphatics: No cervical, supraclavicular, or inguinal adenopathy. GU:  Normal appearing external genitalia without erythema, excoriation, or lesions.  Speculum exam reveals moderately atrophic vaginal mucosa, no lesions.  Bimanual exam reveals cuff intact, no masses or nodularity.  Rectovaginal exam confirms findings.  Laboratory & Radiologic Studies:          Component Ref Range & Units 1 mo ago 3 mo ago 4 mo ago 5 mo ago 7 mo ago 9 mo ago 11 mo ago  Cancer Antigen (CA) 125 0.0 - 38.1 U/mL 9.4 8.8 CM 7.6 CM 7.8 CM 10.2 CM 41.3 High  CM 452.0 High  CM     Assessment & Plan: Lauren Chandler is a 58 y.o. 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 Now on Lynparza for maintenance in the setting of BRCA2 mutation.   The patient is doing very  well.  She is NED based on exam and her last imaging.  Recent CA-125 was normal.   She now on Lynparza for maintenance and tolerating this without any significant side effects. She is scheduled for CT scan in early October.   NCCN surveillance recommendations, we will continue with follow-up visits every 2-4 months.  I will plan to see her in 3 months.    20 minutes of total time was spent for this patient encounter, including preparation, face-to-face counseling with the patient and coordination of care, and documentation of the encounter.  Eugene Garnet, MD  Division of Gynecologic Oncology  Department of Obstetrics and Gynecology  [facility]

## 2023-07-08 ENCOUNTER — Inpatient Hospital Stay: Payer: BC Managed Care – PPO | Attending: Gynecologic Oncology | Admitting: Gynecologic Oncology

## 2023-07-08 ENCOUNTER — Encounter: Payer: Self-pay | Admitting: Gynecologic Oncology

## 2023-07-08 VITALS — BP 112/68 | HR 102 | Temp 98.2°F | Resp 18 | Wt 172.4 lb

## 2023-07-08 DIAGNOSIS — C482 Malignant neoplasm of peritoneum, unspecified: Secondary | ICD-10-CM | POA: Insufficient documentation

## 2023-07-08 DIAGNOSIS — Z1501 Genetic susceptibility to malignant neoplasm of breast: Secondary | ICD-10-CM | POA: Diagnosis not present

## 2023-07-08 DIAGNOSIS — Z1502 Genetic susceptibility to malignant neoplasm of ovary: Secondary | ICD-10-CM | POA: Insufficient documentation

## 2023-07-08 DIAGNOSIS — Z1509 Genetic susceptibility to other malignant neoplasm: Secondary | ICD-10-CM | POA: Diagnosis not present

## 2023-07-08 DIAGNOSIS — Z148 Genetic carrier of other disease: Secondary | ICD-10-CM | POA: Diagnosis not present

## 2023-07-08 DIAGNOSIS — Z90710 Acquired absence of both cervix and uterus: Secondary | ICD-10-CM | POA: Diagnosis not present

## 2023-07-08 DIAGNOSIS — Z90722 Acquired absence of ovaries, bilateral: Secondary | ICD-10-CM | POA: Diagnosis not present

## 2023-07-08 DIAGNOSIS — Z9221 Personal history of antineoplastic chemotherapy: Secondary | ICD-10-CM | POA: Insufficient documentation

## 2023-07-08 DIAGNOSIS — Z79899 Other long term (current) drug therapy: Secondary | ICD-10-CM | POA: Insufficient documentation

## 2023-07-08 NOTE — Patient Instructions (Signed)
It was good to see you today.  I do not see or feel any evidence of cancer recurrence on your exam.  I will see you for follow-up in 3 months.  As always, if you develop any new and concerning symptoms before your next visit, please call to see me sooner.  

## 2023-08-02 ENCOUNTER — Ambulatory Visit (HOSPITAL_COMMUNITY)
Admission: RE | Admit: 2023-08-02 | Discharge: 2023-08-02 | Disposition: A | Discharge status: Home / Self Care | Payer: BC Managed Care – PPO | Source: Ambulatory Visit | Attending: Hematology and Oncology | Admitting: Hematology and Oncology

## 2023-08-02 ENCOUNTER — Inpatient Hospital Stay: Payer: BC Managed Care – PPO | Attending: Gynecologic Oncology

## 2023-08-02 DIAGNOSIS — J984 Other disorders of lung: Secondary | ICD-10-CM | POA: Insufficient documentation

## 2023-08-02 DIAGNOSIS — R631 Polydipsia: Secondary | ICD-10-CM | POA: Insufficient documentation

## 2023-08-02 DIAGNOSIS — Z79890 Hormone replacement therapy (postmenopausal): Secondary | ICD-10-CM | POA: Diagnosis not present

## 2023-08-02 DIAGNOSIS — F419 Anxiety disorder, unspecified: Secondary | ICD-10-CM | POA: Diagnosis not present

## 2023-08-02 DIAGNOSIS — Z79899 Other long term (current) drug therapy: Secondary | ICD-10-CM | POA: Diagnosis not present

## 2023-08-02 DIAGNOSIS — R911 Solitary pulmonary nodule: Secondary | ICD-10-CM | POA: Diagnosis not present

## 2023-08-02 DIAGNOSIS — C569 Malignant neoplasm of unspecified ovary: Secondary | ICD-10-CM | POA: Diagnosis not present

## 2023-08-02 DIAGNOSIS — K432 Incisional hernia without obstruction or gangrene: Secondary | ICD-10-CM | POA: Insufficient documentation

## 2023-08-02 DIAGNOSIS — I080 Rheumatic disorders of both mitral and aortic valves: Secondary | ICD-10-CM | POA: Diagnosis not present

## 2023-08-02 DIAGNOSIS — C482 Malignant neoplasm of peritoneum, unspecified: Secondary | ICD-10-CM | POA: Insufficient documentation

## 2023-08-02 DIAGNOSIS — I2510 Atherosclerotic heart disease of native coronary artery without angina pectoris: Secondary | ICD-10-CM | POA: Diagnosis not present

## 2023-08-02 DIAGNOSIS — Z9081 Acquired absence of spleen: Secondary | ICD-10-CM | POA: Insufficient documentation

## 2023-08-02 DIAGNOSIS — K5730 Diverticulosis of large intestine without perforation or abscess without bleeding: Secondary | ICD-10-CM | POA: Insufficient documentation

## 2023-08-02 DIAGNOSIS — I700 Atherosclerosis of aorta: Secondary | ICD-10-CM | POA: Diagnosis not present

## 2023-08-02 LAB — COMPREHENSIVE METABOLIC PANEL
ALT: 11 U/L (ref 0–44)
AST: 15 U/L (ref 15–41)
Albumin: 3.9 g/dL (ref 3.5–5.0)
Alkaline Phosphatase: 65 U/L (ref 38–126)
Anion gap: 7 (ref 5–15)
BUN: 28 mg/dL — ABNORMAL HIGH (ref 6–20)
CO2: 28 mmol/L (ref 22–32)
Calcium: 9.6 mg/dL (ref 8.9–10.3)
Chloride: 102 mmol/L (ref 98–111)
Creatinine, Ser: 1.2 mg/dL — ABNORMAL HIGH (ref 0.44–1.00)
GFR, Estimated: 53 mL/min — ABNORMAL LOW (ref >60)
Glucose, Bld: 93 mg/dL (ref 70–99)
Potassium: 3.9 mmol/L (ref 3.5–5.1)
Sodium: 137 mmol/L (ref 135–145)
Total Bilirubin: 0.4 mg/dL (ref 0.3–1.2)
Total Protein: 7.6 g/dL (ref 6.5–8.1)

## 2023-08-02 LAB — CBC WITH DIFFERENTIAL/PLATELET
Abs Immature Granulocytes: 0.06 10*3/uL (ref 0.00–0.07)
Basophils Absolute: 0.1 10*3/uL (ref 0.0–0.1)
Basophils Relative: 1 %
Eosinophils Absolute: 0.2 10*3/uL (ref 0.0–0.5)
Eosinophils Relative: 3 %
HCT: 31.5 % — ABNORMAL LOW (ref 36.0–46.0)
Hemoglobin: 11.3 g/dL — ABNORMAL LOW (ref 12.0–15.0)
Immature Granulocytes: 1 %
Lymphocytes Relative: 30 %
Lymphs Abs: 2.3 10*3/uL (ref 0.7–4.0)
MCH: 36.3 pg — ABNORMAL HIGH (ref 26.0–34.0)
MCHC: 35.9 g/dL (ref 30.0–36.0)
MCV: 101.3 fL — ABNORMAL HIGH (ref 80.0–100.0)
Monocytes Absolute: 0.8 10*3/uL (ref 0.1–1.0)
Monocytes Relative: 10 %
Neutro Abs: 4.3 10*3/uL (ref 1.7–7.7)
Neutrophils Relative %: 55 %
Platelets: 340 10*3/uL (ref 150–400)
RBC: 3.11 MIL/uL — ABNORMAL LOW (ref 3.87–5.11)
RDW: 17.1 % — ABNORMAL HIGH (ref 11.5–15.5)
WBC: 7.8 10*3/uL (ref 4.0–10.5)
nRBC: 8.4 % — ABNORMAL HIGH (ref 0.0–0.2)

## 2023-08-02 MED ORDER — IOHEXOL 9 MG/ML PO SOLN
1000.0000 mL | ORAL | Status: AC
  Administered 2023-08-02: 1000 mL via ORAL

## 2023-08-02 MED ORDER — HEPARIN SOD (PORK) LOCK FLUSH 100 UNIT/ML IV SOLN
500.0000 [IU] | Freq: Once | INTRAVENOUS | Status: AC
  Administered 2023-08-02: 500 [IU] via INTRAVENOUS

## 2023-08-02 MED ORDER — HEPARIN SOD (PORK) LOCK FLUSH 100 UNIT/ML IV SOLN
500.0000 [IU] | Freq: Once | INTRAVENOUS | Status: AC
  Administered 2023-08-02: 500 [IU]

## 2023-08-02 MED ORDER — SODIUM CHLORIDE 0.9% FLUSH
10.0000 mL | Freq: Once | INTRAVENOUS | Status: AC
  Administered 2023-08-02: 10 mL

## 2023-08-02 MED ORDER — IOHEXOL 300 MG/ML  SOLN
100.0000 mL | Freq: Once | INTRAMUSCULAR | Status: AC | PRN
  Administered 2023-08-02: 100 mL via INTRAVENOUS

## 2023-08-02 MED ORDER — HEPARIN SOD (PORK) LOCK FLUSH 100 UNIT/ML IV SOLN
INTRAVENOUS | Status: AC
  Filled 2023-08-02: qty 5

## 2023-08-02 MED ORDER — SODIUM CHLORIDE (PF) 0.9 % IJ SOLN
INTRAMUSCULAR | Status: AC
  Filled 2023-08-02: qty 50

## 2023-08-03 LAB — CA 125: Cancer Antigen (CA) 125: 8 U/mL (ref 0.0–38.1)

## 2023-08-09 ENCOUNTER — Inpatient Hospital Stay (HOSPITAL_BASED_OUTPATIENT_CLINIC_OR_DEPARTMENT_OTHER): Payer: BC Managed Care – PPO | Admitting: Hematology and Oncology

## 2023-08-09 VITALS — BP 117/77 | HR 108 | Temp 97.4°F | Resp 18 | Ht 62.21 in | Wt 173.2 lb

## 2023-08-09 DIAGNOSIS — K5730 Diverticulosis of large intestine without perforation or abscess without bleeding: Secondary | ICD-10-CM | POA: Diagnosis not present

## 2023-08-09 DIAGNOSIS — Z1501 Genetic susceptibility to malignant neoplasm of breast: Secondary | ICD-10-CM

## 2023-08-09 DIAGNOSIS — R631 Polydipsia: Secondary | ICD-10-CM | POA: Diagnosis not present

## 2023-08-09 DIAGNOSIS — K432 Incisional hernia without obstruction or gangrene: Secondary | ICD-10-CM | POA: Diagnosis not present

## 2023-08-09 DIAGNOSIS — Z79890 Hormone replacement therapy (postmenopausal): Secondary | ICD-10-CM | POA: Diagnosis not present

## 2023-08-09 DIAGNOSIS — D6481 Anemia due to antineoplastic chemotherapy: Secondary | ICD-10-CM | POA: Diagnosis not present

## 2023-08-09 DIAGNOSIS — R911 Solitary pulmonary nodule: Secondary | ICD-10-CM | POA: Diagnosis not present

## 2023-08-09 DIAGNOSIS — F419 Anxiety disorder, unspecified: Secondary | ICD-10-CM | POA: Diagnosis not present

## 2023-08-09 DIAGNOSIS — C482 Malignant neoplasm of peritoneum, unspecified: Secondary | ICD-10-CM | POA: Diagnosis not present

## 2023-08-09 DIAGNOSIS — I080 Rheumatic disorders of both mitral and aortic valves: Secondary | ICD-10-CM | POA: Diagnosis not present

## 2023-08-09 DIAGNOSIS — I700 Atherosclerosis of aorta: Secondary | ICD-10-CM | POA: Diagnosis not present

## 2023-08-09 DIAGNOSIS — Z9081 Acquired absence of spleen: Secondary | ICD-10-CM | POA: Diagnosis not present

## 2023-08-09 DIAGNOSIS — I2510 Atherosclerotic heart disease of native coronary artery without angina pectoris: Secondary | ICD-10-CM | POA: Diagnosis not present

## 2023-08-09 DIAGNOSIS — Z79899 Other long term (current) drug therapy: Secondary | ICD-10-CM | POA: Diagnosis not present

## 2023-08-09 DIAGNOSIS — Z1509 Genetic susceptibility to other malignant neoplasm: Secondary | ICD-10-CM

## 2023-08-09 DIAGNOSIS — T451X5A Adverse effect of antineoplastic and immunosuppressive drugs, initial encounter: Secondary | ICD-10-CM | POA: Diagnosis not present

## 2023-08-09 DIAGNOSIS — J984 Other disorders of lung: Secondary | ICD-10-CM | POA: Diagnosis not present

## 2023-08-09 NOTE — Progress Notes (Signed)
[facility] OFFICE PROGRESS NOTE  Patient Care Team: [name], MD as PCP - General (Internal Medicine) [name], MD as PCP - Cardiology (Cardiology) Charna Elizabeth, MD as Consulting Physician (Gastroenterology)  HISTORY OF PRESENTING ILLNESS: Discussed the use of AI scribe software for clinical note transcription with the patient, who gave verbal consent to proceed.  History of Present Illness   The patient, with a history of primary peritoneal cancer, reports no new symptoms or side effects of treatment since the last visit. She mentions an increased thirst, but denies any issues with medication refills, specifically Lynparza. The patient has been compliant with her treatment regimen and has been actively participating in follow-up care, including recent scans. The patient expresses some anxiety about the radiologist's comments regarding her pancreas, but after discussion, she decides to follow the recommendation of monitoring with repeat scans in six months.     She is here accompanied by her husband.  We reviewed multiple imaging studies together    Assessment and Plan    Primary peritoneal cancer Recent imaging shows no evidence of disease. Noted incidental findings including a small lung nodule, a 5mm pancreatic lesion, and a small abdominal wall hernia. All findings are stable and not concerning at this time. -Continue current treatment with Lynparza. -Repeat imaging in six months (April 2025).  Polydipsia Patient reports increased thirst. No clear etiology identified as blood sugar is normal, kidney function is stable, and patient is not on diuretics. -Monitor symptoms.  Abdominal wall hernia Incidental finding on imaging. Patient is aware and can palpate it. -No intervention planned at this time.  Colonic diverticulosis Incidental finding on imaging. No inflammation noted. -Monitor symptoms.  Follow-up Plan to see patient in December 2024 for labs  and to ensure continued follow-up.          No orders of the defined types were placed in this encounter.   All questions were answered. The patient knows to call the clinic with any problems, questions or concerns. The total time spent in the appointment was 30 minutes encounter with patients including review of chart and various tests results, discussions about plan of care and coordination of care plan   Artis Delay, MD 08/09/2023 9:58 AM  REVIEW OF SYSTEMS:  All other systems were reviewed with the patient and are negative.  I have reviewed the past medical history, past surgical history, social history and family history with the patient and they are unchanged from previous note.  ALLERGIES:  has No Known Allergies.  MEDICATIONS:  Current Outpatient Medications  Medication Sig Dispense Refill   aspirin 81 MG chewable tablet Chew 81 mg by mouth daily.     levothyroxine (SYNTHROID) 125 MCG tablet Take 125 mcg by mouth daily before breakfast.     olaparib (LYNPARZA) 100 MG tablet TAKE 2 TABLETS (200 MG TOTAL) TWICE A DAY. SWALLOW WHOLE. MAY TAKE WITH FOOD TO DECREASE NAUSEA AND VOMITING. 120 tablet 9   pantoprazole (PROTONIX) 40 MG tablet Take 40 mg by mouth daily before breakfast.     rosuvastatin (CRESTOR) 5 MG tablet Take 1 tablet (5 mg total) by mouth daily. 90 tablet 3   vitamin B-12 (CYANOCOBALAMIN) 500 MCG tablet Take 500 mcg by mouth every evening.     No current facility-administered medications for this visit.    SUMMARY OF ONCOLOGIC HISTORY: Oncology History Overview Note  BRCA2 positive   Primary peritoneal adenocarcinoma (HCC)  07/19/2022 Imaging   1. Small left pleural effusion  2. 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HCC)   07/23/2022 Cancer Staging   Staging form: Ovary, Fallopian Tube, and Primary Peritoneal Carcinoma, AJCC 8th Edition - Clinical stage from  07/23/2022: FIGO Stage IIIC (cT3c, cN0, cM0) - Signed by Artis Delay, MD on 07/23/2022 Stage prefix: Initial diagnosis   07/23/2022 Pathology Results   FINAL MICROSCOPIC DIAGNOSIS:  - Malignant cells present  - See comment   SPECIMEN ADEQUACY:  Satisfactory for evaluation   DIAGNOSTIC COMMENTS:  Immunohistochemical stains show that the tumor cells are positive for ER, PAX8, p16 and p53 (clonal overexpression pattern), consistent with a high-grade serous carcinoma.  Immunostain for WT1 is also diffusely positive in the tumor cells, most consistent with an ovarian primary.  Dr. Kenyon Ana reviewed the case and concurs with the above diagnosis.    07/30/2022 Procedure   Successful right chest port placement via the right internal jugular vein. The port is ready for immediate use.   08/02/2022 - 12/28/2022 Chemotherapy   Patient is on Treatment Plan : OVARIAN Carboplatin (AUC 6) + Paclitaxel (175) q21d X 6 Cycles     08/03/2022 Procedure   Successful ultrasound-guided paracentesis yielding 4.1 liters of peritoneal fluid.   08/09/2022 Tumor Marker   Patient's tumor was tested for the following markers: CA-125. Results of the tumor marker test revealed 452.   08/12/2022 Echocardiogram    1. Left ventricular ejection fraction, by estimation, is 40 to 45%. The left ventricle has mildly decreased function. The left ventricle demonstrates global hypokinesis. Left ventricular diastolic parameters are indeterminate.   2. Right ventricular systolic function is normal. The right ventricular size is normal.   3. The mitral valve is normal in structure. Mild mitral valve regurgitation. No evidence of mitral stenosis.   4. The aortic valve is normal in structure. Aortic valve regurgitation is mild to moderate. Aortic valve sclerosis/calcification is present, without any evidence of aortic stenosis.   5. The inferior vena cava is normal in size with greater than 50% respiratory variability, suggesting right  atrial pressure of 3 mmHg.    09/23/2022 Tumor Marker   Patient's tumor was tested for the following markers: CA-125. Results of the tumor marker test revealed 41.3.   10/07/2022 Imaging   1. Compared to the outside abdominopelvic CT of 07/19/2022, significant improvement in peritoneal carcinomatosis, without bowel obstruction or other acute complication. 2. Decreased size of upper abdominal nodes, possibly also representing response to therapy. 3. No new or progressive disease and no evidence of thoracic metastasis. 4. Bladder wall thickening and mucosal hyperenhancement are suspicious for cystitis. 5. 4 mm left upper lobe pulmonary nodule is most likely benign/incidental but can be re-evaluated at follow-up. 6. Aortic valvular calcifications. Consider echocardiography to evaluate for valvular dysfunction. 7. Coronary artery atherosclerosis. Aortic Atherosclerosis (ICD10-I70.0).     11/11/2022 Surgery   Exploratory laparotomy, lysis of adhesions for approximately 45 minutes, total omentectomy with radical tumor debulking including resection of perigastric nodule, excision and fulguration of multiple small bowel mesenteric nodules, resection of multiple sigmoid epiploica, fulguration of peritoneal lesions along bilateral pelvic sidewalls, peritoneal stripping of bladder peritoneum   Findings: On bimanual exam, no masses appreciated at the vaginal cuff, no nodularity.  On intra-abdominal entry, infracolic omentum retracted and involved by residual tumor implant measuring approximately 10 x 4 cm.  Nodularity palpated almost up to the splenic flexure.  Supracolic omentum involved to within 2 cm of the greater curvature of the stomach.  Additional 2 x 3 cm implant noted lateral and inferior to the gastric pylorus (sitting anterior to the duodenum and just inferior to the gallbladder).  Ascending and transverse colon normal in appearance.  Small bowel run from the ileocecal valve to the ligament of  Treitz.  Rare small nodules within the mesentery noted.  These were all either excised or fulgurated.  Right aspect of the liver and diaphragm smooth to palpation, no visible lesions.  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  No ascites.  Within the pelvis, minimal nodularity seen along bilateral pelvic sidewalls near the infundibulopelvic remnants, suspected to be related to prior hysterectomy and BSO.  Several sigmoid epiploica with tumor implants versus treated tumor, all excised fulgurated.  No nodularity within the deep pelvis although some evidence of what appeared to be treated tumor versus tumor rind involving the bladder peritoneum was stripped in this location.  Biopsies taken from the posterior cul-de-sac. R0 resection at the end of surgery.   11/11/2022 Pathology Results   A. SMALL BOWEL MESENTARY NODULES: -  Densely fibrotic soft tissue/scar with abundant foamy histiocytes, negative for malignancy.  B. 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  C. PERIGASTRIC IMPLANTS, EXCISION: -  High-grade serous carcinoma.  D. SIGMOID EPIPLOICA, EXCISION: High-grade serous carcinoma in the background of dense stromal fibrosis/scar, calcifications and foamy histiocytes.  E. BLADDER PERITONEUM, EXCISION: -  High-grade serous carcinoma.  F. CUL DE SAC, POSTERIOR, BIOPSY: -  Focal high-grade serous carcinoma in the background of predominantly broad and foamy histiocytes.  Note: It is noted that the patient had a diagnosis of high-grade serous carcinoma from a cytology of the ascites in September 2023 and was clinically staged as cT3c.  Patient is now status post neoadjuvant chemotherapy.  It is noted that this is a debulking surgery.   No additional testing is performed to reserve tissue for clinician directed testing.    12/08/2022 Tumor Marker   Patient's tumor was tested for the following markers: CA-125. Results of the tumor marker test revealed 10.2.   02/03/2023 Imaging   1. Compared to 10/06/2022, interval omentectomy and peritoneal stripping. No residual omental thickening/nodularity. Scattered areas of mesenteric stranding may be postsurgical. 2. No new metastatic disease in the abdomen or pelvis. 3. Unchanged 5 mm enhancing focus along the anterior pancreatic body/tail compared to 07/19/2022. Differential includes intrapancreatic spleen or neoplasm, including small neuroendocrine tumor. Given the size of this lesion, evaluation by MRI would likely be suboptimal. 4. Aortic Atherosclerosis (ICD10-I70.0). Coronary artery calcifications. Assessment for potential risk factor modification, dietary therapy or pharmacologic therapy may be warranted, if clinically indicated.   02/03/2023 Tumor Marker   Patient's tumor was tested for the following markers: CA-125. Results of the tumor marker test revealed 7.8.   02/14/2023 -  Chemotherapy   She started taking olaparib   03/10/2023 Tumor Marker   Patient's tumor was tested for the following markers: CA-125. Results of the tumor marker test revealed 7.6.   04/08/2023 Tumor Marker   Patient's tumor was tested for the following markers: CA-125. Results of the tumor marker test revealed 8.8.   06/08/2023 Tumor Marker   Patient's tumor was tested for the following markers: CA-125. Results of the tumor marker test revealed 9.4.   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 * Tracking Code: BO * EXAM: CT CHEST, ABDOMEN, AND PELVIS WITH CONTRAST TECHNIQUE: Multidetector CT imaging of the chest,  abdomen and pelvis was performed following the standard protocol during bolus administration of intravenous contrast. RADIATION DOSE REDUCTION: This exam was performed according to the departmental dose-optimization program which includes automated exposure control, adjustment of the mA and/or kV according to patient size and/or use of iterative reconstruction technique. CONTRAST:  OMNIPAQUE IOHEXOL 300 MG/ML  SOLN COMPARISON:  Multiple priors including CT October 06, 2022 and February 01, 2023. FINDINGS: CT CHEST FINDINGS Cardiovascular: Accessed right chest Port-A-Cath with tip near the superior cavoatrial junction. Aortic atherosclerosis. Normal size heart. No significant pericardial effusion/thickening. Calcifications of the aortic valve and annulus. Coronary artery calcifications. Mediastinum/Nodes: No suspicious thyroid nodule. Calcified mediastinal lymph nodes. No pathologically enlarged mediastinal, hilar or axillary lymph nodes. Retained versus refluxed contrast in a patulous esophagus. Lungs/Pleura: Biapical pleuroparenchymal scarring. Scattered areas of scarring versus atelectasis. Stable 4 mm left upper lobe pulmonary nodule on image 35/4. No new suspicious pulmonary nodules or masses. Musculoskeletal: Bilateral breast prostheses. No aggressive lytic or blastic lesion of bone. Multilevel degenerative change of the spine. CT ABDOMEN PELVIS FINDINGS Hepatobiliary: No suspicious hepatic lesion. Gallbladder is unremarkable. No biliary ductal dilation. Pancreas: No pancreatic ductal dilation or evidence of acute inflammation. Hyperenhancing focus along the anterior margin of the pancreas measuring 5 mm on image 53/2 is unchanged. Spleen: Prior splenectomy. Adrenals/Urinary Tract: Bilateral adrenal glands appear normal. No hydronephrosis. Kidneys demonstrate symmetric enhancement. Urinary bladder is unremarkable for degree of distension. Stomach/Bowel: Radiopaque enteric contrast material traverses distal  loops of small bowel. Stomach is unremarkable for degree of distension. No pathologic dilation of small or large bowel. Colonic diverticulosis without findings of acute diverticulitis. Vascular/Lymphatic: Aortic atherosclerosis. Normal caliber abdominal aorta. Smooth IVC contours. The portal, splenic and superior mesenteric veins are patent. No pathologically enlarged abdominal or pelvic lymph nodes. Reproductive: Prior hysterectomy and salpingo oophorectomy without suspicious nodularity along the vaginal cuff or in the adnexa. Other: Prior omentectomy and peritoneal stripping. Similar minimal mesenteric/peritoneal stranding. Similar pelvic peritoneal thickening for instance on image 100/2. No free fluid. No new discrete peritoneal or omental nodularity. Postsurgical change in the anterior abdominal wall. Small fat containing peri-incisional hernia. Musculoskeletal: No aggressive lytic or blastic lesion of bone. IMPRESSION: 1. Prior hysterectomy and salpingo-oophorectomy without evidence of local recurrence. 2. Prior omentectomy and peritoneal stripping with similar minimal mesenteric/peritoneal stranding and pelvic peritoneal thickening, likely postsurgical. No new discrete peritoneal or omental nodularity. 3. Stable 4 mm left upper lobe pulmonary nodule. No new suspicious pulmonary nodules or masses. 4. No evidence of new or progressive disease in the chest, abdomen or pelvis. 5. Stable 5 mm hyperenhancing focus along the anterior margin of the pancreas, differential includes intrapancreatic spleen versus neoplasm including small neuroendocrine tumor. Given small size MRI would likely be of limited utility. Suggest continued attention on follow-up imaging. 6.  Aortic Atherosclerosis (ICD10-I70.0). Electronically Signed   By: Maudry Mayhew M.D.   On: 08/02/2023 14:56      08/03/2023 Tumor Marker   Patient's tumor was tested for the following markers: CA-125. Results of the tumor marker test revealed 8.      PHYSICAL EXAMINATION: ECOG PERFORMANCE STATUS: 0 - Asymptomatic  Vitals:   08/09/23 0933  BP: 117/77  Pulse: (!) 108  Resp: 18  Temp: (!) 97.4 F (36.3 C)  SpO2: 98%   Filed Weights   08/09/23 0933  Weight: 173 lb 3.2 oz (78.6 kg)    GENERAL:alert, no distress and comfortable  LABORATORY DATA:  I have reviewed the data as listed    Component Value Date/Time   NA 137 08/02/2023 0908   K 3.9 08/02/2023 0908   CL 102 08/02/2023 0908   CO2 28 08/02/2023 0908   GLUCOSE 93 08/02/2023 0908   BUN 28 (H) 08/02/2023 0908   CREATININE 1.20 (H) 08/02/2023 0908   CREATININE 0.94 12/28/2022 0900   CALCIUM 9.6 08/02/2023 0908   PROT 7.6 08/02/2023 0908   PROT 7.4 02/25/2023 0916   ALBUMIN 3.9 08/02/2023 0908   ALBUMIN 3.9 02/25/2023 0916   AST 15 08/02/2023 0908   AST 18 12/28/2022 0900   ALT 11 08/02/2023 0908   ALT 18 12/28/2022 0900   ALKPHOS 65 08/02/2023 0908   BILITOT 0.4 08/02/2023 0908   BILITOT <0.2 02/25/2023 0916   BILITOT 0.2 (L) 12/28/2022 0900   GFRNONAA 53 (L) 08/02/2023 0908   GFRNONAA >60 12/28/2022 0900    No results found for: "SPEP", "UPEP"  Lab Results  Component Value Date   WBC 7.8 08/02/2023   NEUTROABS 4.3 08/02/2023   HGB 11.3 (L) 08/02/2023   HCT 31.5 (L) 08/02/2023   MCV 101.3 (H) 08/02/2023   PLT 340 08/02/2023      Chemistry      Component Value Date/Time   NA 137 08/02/2023 0908   K 3.9 08/02/2023 0908   CL 102 08/02/2023 0908   CO2 28 08/02/2023 0908   BUN 28 (H) 08/02/2023 0908   CREATININE 1.20 (H) 08/02/2023 0908   CREATININE 0.94 12/28/2022 0900      Component Value Date/Time   CALCIUM 9.6 08/02/2023 0908   ALKPHOS 65 08/02/2023 0908   AST 15 08/02/2023 0908   AST 18 12/28/2022 0900   ALT 11 08/02/2023 0908   ALT 18 12/28/2022 0900   BILITOT 0.4 08/02/2023 0908   BILITOT <0.2 02/25/2023 0916   BILITOT 0.2 (L) 12/28/2022 0900       RADIOGRAPHIC STUDIES: I have personally reviewed the radiological images as  listed and agreed with the findings in the report. 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 * Tracking Code: BO * EXAM: CT CHEST, ABDOMEN, AND PELVIS WITH CONTRAST TECHNIQUE: Multidetector CT imaging of the chest, abdomen and pelvis was performed following the standard protocol during bolus administration of intravenous contrast. RADIATION DOSE REDUCTION: This exam was performed according to the departmental dose-optimization program which includes automated exposure control, adjustment of the mA and/or kV according to patient size and/or use of iterative reconstruction technique. CONTRAST:  OMNIPAQUE IOHEXOL 300 MG/ML  SOLN COMPARISON:  Multiple priors including CT October 06, 2022 and February 01, 2023. FINDINGS: CT CHEST FINDINGS Cardiovascular: Accessed right chest Port-A-Cath with tip near the superior cavoatrial junction. Aortic atherosclerosis. Normal size heart. No significant pericardial effusion/thickening. Calcifications of the aortic valve and annulus. Coronary artery calcifications. Mediastinum/Nodes: No suspicious thyroid nodule. Calcified mediastinal lymph nodes. No pathologically enlarged mediastinal, hilar or axillary lymph nodes. Retained versus refluxed contrast in a patulous esophagus. Lungs/Pleura: Biapical pleuroparenchymal scarring. Scattered areas of scarring versus atelectasis. Stable 4 mm left upper lobe pulmonary nodule on image 35/4. No new suspicious pulmonary nodules or masses. Musculoskeletal: Bilateral breast prostheses. No aggressive lytic or blastic lesion of  bone. Multilevel degenerative change of the spine. CT ABDOMEN PELVIS FINDINGS Hepatobiliary: No suspicious hepatic lesion. Gallbladder is unremarkable. No biliary ductal dilation. Pancreas: No pancreatic ductal dilation or evidence of acute inflammation.  Hyperenhancing focus along the anterior margin of the pancreas measuring 5 mm on image 53/2 is unchanged. Spleen: Prior splenectomy. Adrenals/Urinary Tract: Bilateral adrenal glands appear normal. No hydronephrosis. Kidneys demonstrate symmetric enhancement. Urinary bladder is unremarkable for degree of distension. Stomach/Bowel: Radiopaque enteric contrast material traverses distal loops of small bowel. Stomach is unremarkable for degree of distension. No pathologic dilation of small or large bowel. Colonic diverticulosis without findings of acute diverticulitis. Vascular/Lymphatic: Aortic atherosclerosis. Normal caliber abdominal aorta. Smooth IVC contours. The portal, splenic and superior mesenteric veins are patent. No pathologically enlarged abdominal or pelvic lymph nodes. Reproductive: Prior hysterectomy and salpingo oophorectomy without suspicious nodularity along the vaginal cuff or in the adnexa. Other: Prior omentectomy and peritoneal stripping. Similar minimal mesenteric/peritoneal stranding. Similar pelvic peritoneal thickening for instance on image 100/2. No free fluid. No new discrete peritoneal or omental nodularity. Postsurgical change in the anterior abdominal wall. Small fat containing peri-incisional hernia. Musculoskeletal: No aggressive lytic or blastic lesion of bone. IMPRESSION: 1. Prior hysterectomy and salpingo-oophorectomy without evidence of local recurrence. 2. Prior omentectomy and peritoneal stripping with similar minimal mesenteric/peritoneal stranding and pelvic peritoneal thickening, likely postsurgical. No new discrete peritoneal or omental nodularity. 3. Stable 4 mm left upper lobe pulmonary nodule. No new suspicious pulmonary nodules or masses. 4. No evidence of new or progressive disease in the chest, abdomen or pelvis. 5. Stable 5 mm hyperenhancing focus along the anterior margin of the pancreas, differential includes intrapancreatic spleen versus neoplasm including small  neuroendocrine tumor. Given small size MRI would likely be of limited utility. Suggest continued attention on follow-up imaging. 6.  Aortic Atherosclerosis (ICD10-I70.0). Electronically Signed   By: Maudry Mayhew M.D.   On: 08/02/2023 14:56

## 2023-09-08 NOTE — Progress Notes (Signed)
This encounter was created in error - please disregard.

## 2023-09-27 ENCOUNTER — Encounter: Payer: Self-pay | Admitting: Hematology and Oncology

## 2023-09-27 ENCOUNTER — Other Ambulatory Visit: Payer: Self-pay

## 2023-09-27 ENCOUNTER — Inpatient Hospital Stay: Payer: BC Managed Care – PPO | Attending: Gynecologic Oncology

## 2023-09-27 ENCOUNTER — Telehealth: Payer: Self-pay | Admitting: *Deleted

## 2023-09-27 ENCOUNTER — Inpatient Hospital Stay: Payer: BC Managed Care – PPO | Admitting: Hematology and Oncology

## 2023-09-27 VITALS — BP 126/73 | HR 93 | Temp 97.4°F | Resp 18 | Ht 62.21 in | Wt 171.2 lb

## 2023-09-27 DIAGNOSIS — Z1501 Genetic susceptibility to malignant neoplasm of breast: Secondary | ICD-10-CM | POA: Insufficient documentation

## 2023-09-27 DIAGNOSIS — D6481 Anemia due to antineoplastic chemotherapy: Secondary | ICD-10-CM

## 2023-09-27 DIAGNOSIS — Z90722 Acquired absence of ovaries, bilateral: Secondary | ICD-10-CM | POA: Insufficient documentation

## 2023-09-27 DIAGNOSIS — R7989 Other specified abnormal findings of blood chemistry: Secondary | ICD-10-CM

## 2023-09-27 DIAGNOSIS — Z148 Genetic carrier of other disease: Secondary | ICD-10-CM | POA: Diagnosis not present

## 2023-09-27 DIAGNOSIS — Z79899 Other long term (current) drug therapy: Secondary | ICD-10-CM | POA: Diagnosis not present

## 2023-09-27 DIAGNOSIS — Z90710 Acquired absence of both cervix and uterus: Secondary | ICD-10-CM | POA: Insufficient documentation

## 2023-09-27 DIAGNOSIS — T451X5A Adverse effect of antineoplastic and immunosuppressive drugs, initial encounter: Secondary | ICD-10-CM | POA: Insufficient documentation

## 2023-09-27 DIAGNOSIS — Z9081 Acquired absence of spleen: Secondary | ICD-10-CM | POA: Insufficient documentation

## 2023-09-27 DIAGNOSIS — K432 Incisional hernia without obstruction or gangrene: Secondary | ICD-10-CM | POA: Diagnosis not present

## 2023-09-27 DIAGNOSIS — I080 Rheumatic disorders of both mitral and aortic valves: Secondary | ICD-10-CM | POA: Diagnosis not present

## 2023-09-27 DIAGNOSIS — Z1502 Genetic susceptibility to malignant neoplasm of ovary: Secondary | ICD-10-CM | POA: Diagnosis not present

## 2023-09-27 DIAGNOSIS — I700 Atherosclerosis of aorta: Secondary | ICD-10-CM | POA: Diagnosis not present

## 2023-09-27 DIAGNOSIS — F4321 Adjustment disorder with depressed mood: Secondary | ICD-10-CM | POA: Insufficient documentation

## 2023-09-27 DIAGNOSIS — I2510 Atherosclerotic heart disease of native coronary artery without angina pectoris: Secondary | ICD-10-CM | POA: Insufficient documentation

## 2023-09-27 DIAGNOSIS — Z8042 Family history of malignant neoplasm of prostate: Secondary | ICD-10-CM | POA: Insufficient documentation

## 2023-09-27 DIAGNOSIS — Z800 Family history of malignant neoplasm of digestive organs: Secondary | ICD-10-CM | POA: Insufficient documentation

## 2023-09-27 DIAGNOSIS — Z1509 Genetic susceptibility to other malignant neoplasm: Secondary | ICD-10-CM | POA: Diagnosis not present

## 2023-09-27 DIAGNOSIS — Z803 Family history of malignant neoplasm of breast: Secondary | ICD-10-CM | POA: Insufficient documentation

## 2023-09-27 DIAGNOSIS — C482 Malignant neoplasm of peritoneum, unspecified: Secondary | ICD-10-CM | POA: Diagnosis not present

## 2023-09-27 DIAGNOSIS — Z8571 Personal history of Hodgkin lymphoma: Secondary | ICD-10-CM | POA: Insufficient documentation

## 2023-09-27 DIAGNOSIS — Z853 Personal history of malignant neoplasm of breast: Secondary | ICD-10-CM | POA: Insufficient documentation

## 2023-09-27 DIAGNOSIS — Z9079 Acquired absence of other genital organ(s): Secondary | ICD-10-CM | POA: Insufficient documentation

## 2023-09-27 DIAGNOSIS — K5730 Diverticulosis of large intestine without perforation or abscess without bleeding: Secondary | ICD-10-CM | POA: Insufficient documentation

## 2023-09-27 DIAGNOSIS — J984 Other disorders of lung: Secondary | ICD-10-CM | POA: Insufficient documentation

## 2023-09-27 LAB — CBC WITH DIFFERENTIAL/PLATELET
Abs Immature Granulocytes: 0.02 10*3/uL (ref 0.00–0.07)
Basophils Absolute: 0.1 10*3/uL (ref 0.0–0.1)
Basophils Relative: 1 %
Eosinophils Absolute: 0.1 10*3/uL (ref 0.0–0.5)
Eosinophils Relative: 2 %
HCT: 32.7 % — ABNORMAL LOW (ref 36.0–46.0)
Hemoglobin: 11.6 g/dL — ABNORMAL LOW (ref 12.0–15.0)
Immature Granulocytes: 0 %
Lymphocytes Relative: 26 %
Lymphs Abs: 1.9 10*3/uL (ref 0.7–4.0)
MCH: 36.5 pg — ABNORMAL HIGH (ref 26.0–34.0)
MCHC: 35.5 g/dL (ref 30.0–36.0)
MCV: 102.8 fL — ABNORMAL HIGH (ref 80.0–100.0)
Monocytes Absolute: 0.6 10*3/uL (ref 0.1–1.0)
Monocytes Relative: 7 %
Neutro Abs: 4.7 10*3/uL (ref 1.7–7.7)
Neutrophils Relative %: 64 %
Platelets: 322 10*3/uL (ref 150–400)
RBC: 3.18 MIL/uL — ABNORMAL LOW (ref 3.87–5.11)
RDW: 16.8 % — ABNORMAL HIGH (ref 11.5–15.5)
WBC: 7.4 10*3/uL (ref 4.0–10.5)
nRBC: 9.3 % — ABNORMAL HIGH (ref 0.0–0.2)

## 2023-09-27 LAB — COMPREHENSIVE METABOLIC PANEL
ALT: 15 U/L (ref 0–44)
AST: 18 U/L (ref 15–41)
Albumin: 4 g/dL (ref 3.5–5.0)
Alkaline Phosphatase: 74 U/L (ref 38–126)
Anion gap: 7 (ref 5–15)
BUN: 18 mg/dL (ref 6–20)
CO2: 28 mmol/L (ref 22–32)
Calcium: 9.4 mg/dL (ref 8.9–10.3)
Chloride: 101 mmol/L (ref 98–111)
Creatinine, Ser: 1.33 mg/dL — ABNORMAL HIGH (ref 0.44–1.00)
GFR, Estimated: 47 mL/min — ABNORMAL LOW (ref >60)
Glucose, Bld: 95 mg/dL (ref 70–99)
Potassium: 3.8 mmol/L (ref 3.5–5.1)
Sodium: 136 mmol/L (ref 135–145)
Total Bilirubin: 0.4 mg/dL (ref <1.2)
Total Protein: 7.5 g/dL (ref 6.5–8.1)

## 2023-09-27 MED ORDER — SODIUM CHLORIDE 0.9% FLUSH
10.0000 mL | Freq: Once | INTRAVENOUS | Status: AC
  Administered 2023-09-27: 10 mL

## 2023-09-27 MED ORDER — HEPARIN SOD (PORK) LOCK FLUSH 100 UNIT/ML IV SOLN
500.0000 [IU] | Freq: Once | INTRAVENOUS | Status: AC
  Administered 2023-09-27: 500 [IU]

## 2023-09-27 NOTE — Progress Notes (Signed)
[facility] OFFICE PROGRESS NOTE  Patient Care Team: [name], MD as PCP - General (Internal Medicine) [name], MD as PCP - Cardiology (Cardiology) Charna Elizabeth, MD as Consulting Physician (Gastroenterology)  ASSESSMENT & PLAN:  Primary peritoneal adenocarcinoma (HCC) Overall, she tolerated Garey Ham well without major side effects  We will proceed with treatment without delay Plan to see her again in 2 months for further follow-up Plan to repeat imaging in APril 2025  Anemia due to antineoplastic chemotherapy She has stable anemia with hemoglobin around 11 I believe this is her baseline Observe only  Elevated serum creatinine She has intermittent elevated serum creatinine and I suspect she have chronic kidney disease I reviewed her last CT imaging which show borderline reduced size of her kidneys Observe only and continue risk factor modification  No orders of the defined types were placed in this encounter.   All questions were answered. The patient knows to call the clinic with any problems, questions or concerns. The total time spent in the appointment was 30 minutes encounter with patients including review of chart and various tests results, discussions about plan of care and coordination of care plan   Artis Delay, MD 09/27/2023 4:16 PM  INTERVAL HISTORY: Please see below for problem oriented charting. she returns for surveillance follow-up on maintenance olaparib She tolerated treatment well Denies recent changes to her bowel habits or abdominal pain We discussed test results She has questions related to abnormal renal function and we discussed blood work as well as review of CT imaging  REVIEW OF SYSTEMS:   Constitutional: Denies fevers, chills or abnormal weight loss Eyes: Denies blurriness of vision Ears, nose, mouth, throat, and face: Denies mucositis or sore throat Respiratory: Denies cough, dyspnea or wheezes Cardiovascular: Denies  palpitation, chest discomfort or lower extremity swelling Gastrointestinal:  Denies nausea, heartburn or change in bowel habits Skin: Denies abnormal skin rashes Lymphatics: Denies new lymphadenopathy or easy bruising Neurological:Denies numbness, tingling or new weaknesses Behavioral/Psych: Mood is stable, no new changes  All other systems were reviewed with the patient and are negative.  I have reviewed the past medical history, past surgical history, social history and family history with the patient and they are unchanged from previous note.  ALLERGIES:  has No Known Allergies.  MEDICATIONS:  Current Outpatient Medications  Medication Sig Dispense Refill   aspirin 81 MG chewable tablet Chew 81 mg by mouth daily.     levothyroxine (SYNTHROID) 125 MCG tablet Take 125 mcg by mouth daily before breakfast.     olaparib (LYNPARZA) 100 MG tablet TAKE 2 TABLETS (200 MG TOTAL) TWICE A DAY. SWALLOW WHOLE. MAY TAKE WITH FOOD TO DECREASE NAUSEA AND VOMITING. 120 tablet 9   pantoprazole (PROTONIX) 40 MG tablet Take 40 mg by mouth daily before breakfast.     rosuvastatin (CRESTOR) 5 MG tablet Take 1 tablet (5 mg total) by mouth daily. 90 tablet 3   vitamin B-12 (CYANOCOBALAMIN) 500 MCG tablet Take 500 mcg by mouth every evening.     No current facility-administered medications for this visit.    SUMMARY OF ONCOLOGIC HISTORY: Oncology History Overview Note  BRCA2 positive   Primary peritoneal adenocarcinoma (HCC)  07/19/2022 Imaging   1. Small left pleural effusion  2. 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HCC)   07/23/2022 Cancer Staging   Staging form: Ovary, Fallopian Tube, and Primary Peritoneal Carcinoma, AJCC 8th Edition - Clinical  stage from 07/23/2022: FIGO Stage IIIC (cT3c, cN0, cM0) - Signed by Artis Delay, MD on 07/23/2022 Stage prefix: Initial diagnosis   07/23/2022 Pathology Results   FINAL  MICROSCOPIC DIAGNOSIS:  - Malignant cells present  - See comment   SPECIMEN ADEQUACY:  Satisfactory for evaluation   DIAGNOSTIC COMMENTS:  Immunohistochemical stains show that the tumor cells are positive for ER, PAX8, p16 and p53 (clonal overexpression pattern), consistent with a high-grade serous carcinoma.  Immunostain for WT1 is also diffusely positive in the tumor cells, most consistent with an ovarian primary.  Dr. Kenyon Ana reviewed the case and concurs with the above diagnosis.    07/30/2022 Procedure   Successful right chest port placement via the right internal jugular vein. The port is ready for immediate use.   08/02/2022 - 12/28/2022 Chemotherapy   Patient is on Treatment Plan : OVARIAN Carboplatin (AUC 6) + Paclitaxel (175) q21d X 6 Cycles     08/03/2022 Procedure   Successful ultrasound-guided paracentesis yielding 4.1 liters of peritoneal fluid.   08/09/2022 Tumor Marker   Patient's tumor was tested for the following markers: CA-125. Results of the tumor marker test revealed 452.   08/12/2022 Echocardiogram    1. Left ventricular ejection fraction, by estimation, is 40 to 45%. The left ventricle has mildly decreased function. The left ventricle demonstrates global hypokinesis. Left ventricular diastolic parameters are indeterminate.   2. Right ventricular systolic function is normal. The right ventricular size is normal.   3. The mitral valve is normal in structure. Mild mitral valve regurgitation. No evidence of mitral stenosis.   4. The aortic valve is normal in structure. Aortic valve regurgitation is mild to moderate. Aortic valve sclerosis/calcification is present, without any evidence of aortic stenosis.   5. The inferior vena cava is normal in size with greater than 50% respiratory variability, suggesting right atrial pressure of 3 mmHg.    09/23/2022 Tumor Marker   Patient's tumor was tested for the following markers: CA-125. Results of the tumor marker test  revealed 41.3.   10/07/2022 Imaging   1. Compared to the outside abdominopelvic CT of 07/19/2022, significant improvement in peritoneal carcinomatosis, without bowel obstruction or other acute complication. 2. Decreased size of upper abdominal nodes, possibly also representing response to therapy. 3. No new or progressive disease and no evidence of thoracic metastasis. 4. Bladder wall thickening and mucosal hyperenhancement are suspicious for cystitis. 5. 4 mm left upper lobe pulmonary nodule is most likely benign/incidental but can be re-evaluated at follow-up. 6. Aortic valvular calcifications. Consider echocardiography to evaluate for valvular dysfunction. 7. Coronary artery atherosclerosis. Aortic Atherosclerosis (ICD10-I70.0).     11/11/2022 Surgery   Exploratory laparotomy, lysis of adhesions for approximately 45 minutes, total omentectomy with radical tumor debulking including resection of perigastric nodule, excision and fulguration of multiple small bowel mesenteric nodules, resection of multiple sigmoid epiploica, fulguration of peritoneal lesions along bilateral pelvic sidewalls, peritoneal stripping of bladder peritoneum   Findings: On bimanual exam, no masses appreciated at the vaginal cuff, no nodularity.  On intra-abdominal entry, infracolic omentum retracted and involved by residual tumor implant measuring approximately 10 x 4 cm.  Nodularity palpated almost up to the splenic flexure.  Supracolic omentum involved to within 2 cm of the greater curvature of the stomach.  Additional 2 x 3 cm implant noted lateral and inferior to the gastric pylorus (sitting anterior to the duodenum and just inferior to the gallbladder).  Ascending and transverse colon normal in appearance.  Small bowel run from the  ileocecal valve to the ligament of Treitz.  Rare small nodules within the mesentery noted.  These were all either excised or fulgurated.  Right aspect of the liver and diaphragm smooth to  palpation, no visible lesions.  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  No ascites.  Within the pelvis, minimal nodularity seen along bilateral pelvic sidewalls near the infundibulopelvic remnants, suspected to be related to prior hysterectomy and BSO.  Several sigmoid epiploica with tumor implants versus treated tumor, all excised fulgurated.  No nodularity within the deep pelvis although some evidence of what appeared to be treated tumor versus tumor rind involving the bladder peritoneum was stripped in this location.  Biopsies taken from the posterior cul-de-sac. R0 resection at the end of surgery.   11/11/2022 Pathology Results   A. SMALL BOWEL MESENTARY NODULES: -  Densely fibrotic soft tissue/scar with abundant foamy histiocytes, negative for malignancy.  B. 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  C. PERIGASTRIC IMPLANTS, EXCISION: -  High-grade serous carcinoma.  D. SIGMOID EPIPLOICA, EXCISION: High-grade serous carcinoma in the background of dense stromal fibrosis/scar, calcifications and foamy histiocytes.  E. BLADDER PERITONEUM, EXCISION: -  High-grade serous carcinoma.  F. CUL DE SAC, POSTERIOR, BIOPSY: -  Focal high-grade serous carcinoma in the background of predominantly broad and foamy histiocytes.  Note: It is noted that the patient had a diagnosis of high-grade serous carcinoma from a cytology of the ascites in September 2023 and was clinically staged as cT3c.  Patient is now status post neoadjuvant chemotherapy.  It is noted that this is a debulking surgery.  No additional testing is performed to reserve tissue for clinician directed testing.    12/08/2022 Tumor Marker   Patient's tumor was tested for the following  markers: CA-125. Results of the tumor marker test revealed 10.2.   02/03/2023 Imaging   1. Compared to 10/06/2022, interval omentectomy and peritoneal stripping. No residual omental thickening/nodularity. Scattered areas of mesenteric stranding may be postsurgical. 2. No new metastatic disease in the abdomen or pelvis. 3. Unchanged 5 mm enhancing focus along the anterior pancreatic body/tail compared to 07/19/2022. Differential includes intrapancreatic spleen or neoplasm, including small neuroendocrine tumor. Given the size of this lesion, evaluation by MRI would likely be suboptimal. 4. Aortic Atherosclerosis (ICD10-I70.0). Coronary artery calcifications. Assessment for potential risk factor modification, dietary therapy or pharmacologic therapy may be warranted, if clinically indicated.   02/03/2023 Tumor Marker   Patient's tumor was tested for the following markers: CA-125. Results of the tumor marker test revealed 7.8.   02/14/2023 -  Chemotherapy   She started taking olaparib   03/10/2023 Tumor Marker   Patient's tumor was tested for the following markers: CA-125. Results of the tumor marker test revealed 7.6.   04/08/2023 Tumor Marker   Patient's tumor was tested for the following markers: CA-125. Results of the tumor marker test revealed 8.8.   06/08/2023 Tumor Marker   Patient's tumor was tested for the following markers: CA-125. Results of the tumor marker test revealed 9.4.   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 * Tracking Code: BO * EXAM: CT CHEST, ABDOMEN, AND PELVIS WITH CONTRAST TECHNIQUE: Multidetector CT imaging of the chest,  abdomen and pelvis was performed following the standard protocol during bolus administration of intravenous contrast. RADIATION DOSE REDUCTION: This exam was performed  according to the departmental dose-optimization program which includes automated exposure control, adjustment of the mA and/or kV according to patient size and/or use of iterative reconstruction technique. CONTRAST:  OMNIPAQUE IOHEXOL 300 MG/ML  SOLN COMPARISON:  Multiple priors including CT October 06, 2022 and February 01, 2023. FINDINGS: CT CHEST FINDINGS Cardiovascular: Accessed right chest Port-A-Cath with tip near the superior cavoatrial junction. Aortic atherosclerosis. Normal size heart. No significant pericardial effusion/thickening. Calcifications of the aortic valve and annulus. Coronary artery calcifications. Mediastinum/Nodes: No suspicious thyroid nodule. Calcified mediastinal lymph nodes. No pathologically enlarged mediastinal, hilar or axillary lymph nodes. Retained versus refluxed contrast in a patulous esophagus. Lungs/Pleura: Biapical pleuroparenchymal scarring. Scattered areas of scarring versus atelectasis. Stable 4 mm left upper lobe pulmonary nodule on image 35/4. No new suspicious pulmonary nodules or masses. Musculoskeletal: Bilateral breast prostheses. No aggressive lytic or blastic lesion of bone. Multilevel degenerative change of the spine. CT ABDOMEN PELVIS FINDINGS Hepatobiliary: No suspicious hepatic lesion. Gallbladder is unremarkable. No biliary ductal dilation. Pancreas: No pancreatic ductal dilation or evidence of acute inflammation. Hyperenhancing focus along the anterior margin of the pancreas measuring 5 mm on image 53/2 is unchanged. Spleen: Prior splenectomy. Adrenals/Urinary Tract: Bilateral adrenal glands appear normal. No hydronephrosis. Kidneys demonstrate symmetric enhancement. Urinary bladder is unremarkable for degree of distension. Stomach/Bowel: Radiopaque enteric contrast material traverses distal loops of small bowel. Stomach is unremarkable for degree of distension. No pathologic dilation of small or large bowel. Colonic diverticulosis without findings of acute  diverticulitis. Vascular/Lymphatic: Aortic atherosclerosis. Normal caliber abdominal aorta. Smooth IVC contours. The portal, splenic and superior mesenteric veins are patent. No pathologically enlarged abdominal or pelvic lymph nodes. Reproductive: Prior hysterectomy and salpingo oophorectomy without suspicious nodularity along the vaginal cuff or in the adnexa. Other: Prior omentectomy and peritoneal stripping. Similar minimal mesenteric/peritoneal stranding. Similar pelvic peritoneal thickening for instance on image 100/2. No free fluid. No new discrete peritoneal or omental nodularity. Postsurgical change in the anterior abdominal wall. Small fat containing peri-incisional hernia. Musculoskeletal: No aggressive lytic or blastic lesion of bone. IMPRESSION: 1. Prior hysterectomy and salpingo-oophorectomy without evidence of local recurrence. 2. Prior omentectomy and peritoneal stripping with similar minimal mesenteric/peritoneal stranding and pelvic peritoneal thickening, likely postsurgical. No new discrete peritoneal or omental nodularity. 3. Stable 4 mm left upper lobe pulmonary nodule. No new suspicious pulmonary nodules or masses. 4. No evidence of new or progressive disease in the chest, abdomen or pelvis. 5. Stable 5 mm hyperenhancing focus along the anterior margin of the pancreas, differential includes intrapancreatic spleen versus neoplasm including small neuroendocrine tumor. Given small size MRI would likely be of limited utility. Suggest continued attention on follow-up imaging. 6.  Aortic Atherosclerosis (ICD10-I70.0). Electronically Signed   By: Maudry Mayhew M.D.   On: 08/02/2023 14:56      08/03/2023 Tumor Marker   Patient's tumor was tested for the following markers: CA-125. Results of the tumor marker test revealed 8.     PHYSICAL EXAMINATION: ECOG PERFORMANCE STATUS: 0 - Asymptomatic  Vitals:   09/27/23 1331  BP: 126/73  Pulse: 93  Resp: 18  Temp: (!) 97.4 F (36.3 C)  SpO2:  99%   Filed Weights   09/27/23 1331  Weight: 171 lb 3.2 oz (77.7 kg)    GENERAL:alert, no distress and comfortable SKIN: skin color, texture, turgor are normal, no rashes or significant lesions EYES: normal,  Conjunctiva are pink and non-injected, sclera clear OROPHARYNX:no exudate, no erythema and lips, buccal mucosa, and tongue normal  NECK: supple, thyroid normal size, non-tender, without nodularity LYMPH:  no palpable lymphadenopathy in the cervical, axillary or inguinal LUNGS: clear to auscultation and percussion with normal breathing effort HEART: regular rate & rhythm and no murmurs and no lower extremity edema ABDOMEN:abdomen soft, non-tender and normal bowel sounds Musculoskeletal:no cyanosis of digits and no clubbing  NEURO: alert & oriented x 3 with fluent speech, no focal motor/sensory deficits  LABORATORY DATA:  I have reviewed the data as listed    Component Value Date/Time   NA 136 09/27/2023 1311   K 3.8 09/27/2023 1311   CL 101 09/27/2023 1311   CO2 28 09/27/2023 1311   GLUCOSE 95 09/27/2023 1311   BUN 18 09/27/2023 1311   CREATININE 1.33 (H) 09/27/2023 1311   CREATININE 0.94 12/28/2022 0900   CALCIUM 9.4 09/27/2023 1311   PROT 7.5 09/27/2023 1311   PROT 7.4 02/25/2023 0916   ALBUMIN 4.0 09/27/2023 1311   ALBUMIN 3.9 02/25/2023 0916   AST 18 09/27/2023 1311   AST 18 12/28/2022 0900   ALT 15 09/27/2023 1311   ALT 18 12/28/2022 0900   ALKPHOS 74 09/27/2023 1311   BILITOT 0.4 09/27/2023 1311   BILITOT <0.2 02/25/2023 0916   BILITOT 0.2 (L) 12/28/2022 0900   GFRNONAA 47 (L) 09/27/2023 1311   GFRNONAA >60 12/28/2022 0900    No results found for: "SPEP", "UPEP"  Lab Results  Component Value Date   WBC 7.4 09/27/2023   NEUTROABS 4.7 09/27/2023   HGB 11.6 (L) 09/27/2023   HCT 32.7 (L) 09/27/2023   MCV 102.8 (H) 09/27/2023   PLT 322 09/27/2023      Chemistry      Component Value Date/Time   NA 136 09/27/2023 1311   K 3.8 09/27/2023 1311   CL 101  09/27/2023 1311   CO2 28 09/27/2023 1311   BUN 18 09/27/2023 1311   CREATININE 1.33 (H) 09/27/2023 1311   CREATININE 0.94 12/28/2022 0900      Component Value Date/Time   CALCIUM 9.4 09/27/2023 1311   ALKPHOS 74 09/27/2023 1311   AST 18 09/27/2023 1311   AST 18 12/28/2022 0900   ALT 15 09/27/2023 1311   ALT 18 12/28/2022 0900   BILITOT 0.4 09/27/2023 1311   BILITOT <0.2 02/25/2023 0916   BILITOT 0.2 (L) 12/28/2022 0900

## 2023-09-27 NOTE — Assessment & Plan Note (Signed)
She has stable anemia with hemoglobin around 11 I believe this is her baseline Observe only

## 2023-09-27 NOTE — Assessment & Plan Note (Signed)
She has intermittent elevated serum creatinine and I suspect she have chronic kidney disease I reviewed her last CT imaging which show borderline reduced size of her kidneys Observe only and continue risk factor modification

## 2023-09-27 NOTE — Telephone Encounter (Signed)
Spoke with Lauren Chandler who called the office to reschedule her appt. With Dr.Tucker on 12/27 because patient will be out of town. Pt was given a new appt. 12/26 1445. Pt agreed to date and time and had no further concerns or questions at this time.

## 2023-09-27 NOTE — Assessment & Plan Note (Signed)
Overall, she tolerated Lynparza well without major side effects  We will proceed with treatment without delay Plan to see her again in 2 months for further follow-up Plan to repeat imaging in APril 2025

## 2023-09-28 LAB — CA 125: Cancer Antigen (CA) 125: 7.9 U/mL (ref 0.0–38.1)

## 2023-10-12 ENCOUNTER — Encounter: Payer: Self-pay | Admitting: Gynecologic Oncology

## 2023-10-20 ENCOUNTER — Inpatient Hospital Stay: Payer: BC Managed Care – PPO

## 2023-10-20 ENCOUNTER — Encounter: Payer: Self-pay | Admitting: Gynecologic Oncology

## 2023-10-20 ENCOUNTER — Inpatient Hospital Stay: Payer: BC Managed Care – PPO | Admitting: Gynecologic Oncology

## 2023-10-20 VITALS — BP 126/68 | HR 97 | Temp 97.9°F | Resp 18 | Ht 62.2 in | Wt 170.6 lb

## 2023-10-20 DIAGNOSIS — Z148 Genetic carrier of other disease: Secondary | ICD-10-CM | POA: Diagnosis not present

## 2023-10-20 DIAGNOSIS — K5730 Diverticulosis of large intestine without perforation or abscess without bleeding: Secondary | ICD-10-CM | POA: Diagnosis not present

## 2023-10-20 DIAGNOSIS — K432 Incisional hernia without obstruction or gangrene: Secondary | ICD-10-CM

## 2023-10-20 DIAGNOSIS — I700 Atherosclerosis of aorta: Secondary | ICD-10-CM | POA: Diagnosis not present

## 2023-10-20 DIAGNOSIS — F4321 Adjustment disorder with depressed mood: Secondary | ICD-10-CM

## 2023-10-20 DIAGNOSIS — Z853 Personal history of malignant neoplasm of breast: Secondary | ICD-10-CM | POA: Diagnosis not present

## 2023-10-20 DIAGNOSIS — T451X5A Adverse effect of antineoplastic and immunosuppressive drugs, initial encounter: Secondary | ICD-10-CM | POA: Diagnosis not present

## 2023-10-20 DIAGNOSIS — Z1509 Genetic susceptibility to other malignant neoplasm: Secondary | ICD-10-CM | POA: Diagnosis not present

## 2023-10-20 DIAGNOSIS — Z1501 Genetic susceptibility to malignant neoplasm of breast: Secondary | ICD-10-CM | POA: Diagnosis not present

## 2023-10-20 DIAGNOSIS — J984 Other disorders of lung: Secondary | ICD-10-CM | POA: Diagnosis not present

## 2023-10-20 DIAGNOSIS — D6481 Anemia due to antineoplastic chemotherapy: Secondary | ICD-10-CM | POA: Diagnosis not present

## 2023-10-20 DIAGNOSIS — C482 Malignant neoplasm of peritoneum, unspecified: Secondary | ICD-10-CM

## 2023-10-20 DIAGNOSIS — I080 Rheumatic disorders of both mitral and aortic valves: Secondary | ICD-10-CM | POA: Diagnosis not present

## 2023-10-20 DIAGNOSIS — R7989 Other specified abnormal findings of blood chemistry: Secondary | ICD-10-CM | POA: Diagnosis not present

## 2023-10-20 DIAGNOSIS — Z1502 Genetic susceptibility to malignant neoplasm of ovary: Secondary | ICD-10-CM | POA: Diagnosis not present

## 2023-10-20 DIAGNOSIS — R222 Localized swelling, mass and lump, trunk: Secondary | ICD-10-CM

## 2023-10-20 DIAGNOSIS — I2510 Atherosclerotic heart disease of native coronary artery without angina pectoris: Secondary | ICD-10-CM | POA: Diagnosis not present

## 2023-10-20 NOTE — Progress Notes (Signed)
[facility] CSW Progress Note  Clinical Child psychotherapist contacted patient by phone to address psychosocial needs per the request of MD.  Informed her that her primary CSW would return next week.  Lauren Chandler said she had spoken with Marcelino Duster previously and would wait to speak with her when she returned.  Patient expressed no other needs.    Dorothey Baseman, LCSW Clinical Social Worker [facility]

## 2023-10-20 NOTE — Patient Instructions (Signed)
It was good to see you today.  I do not see or feel any evidence of cancer recurrence on your exam.  I will see you for follow-up in 4 months.  As always, if you develop any new and concerning symptoms before your next visit, please call to see me sooner.  

## 2023-10-20 NOTE — Progress Notes (Signed)
Gynecologic Oncology Return Clinic Visit  10/20/23  Reason for Visit: surveillance   Treatment History: Oncology History Overview Note  BRCA2 positive   Primary peritoneal adenocarcinoma (HCC)  07/19/2022 Imaging   1. Small left pleural effusion  2. 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HCC)   07/23/2022 Cancer Staging   Staging form: Ovary, Fallopian Tube, and Primary Peritoneal Carcinoma, AJCC 8th Edition - Clinical stage from 07/23/2022: FIGO Stage IIIC (cT3c, cN0, cM0) - Signed by Artis Delay, MD on 07/23/2022 Stage prefix: Initial diagnosis   07/23/2022 Pathology Results   FINAL MICROSCOPIC DIAGNOSIS:  - Malignant cells present  - See comment   SPECIMEN ADEQUACY:  Satisfactory for evaluation   DIAGNOSTIC COMMENTS:  Immunohistochemical stains show that the tumor cells are positive for ER, PAX8, p16 and p53 (clonal overexpression pattern), consistent with a high-grade serous carcinoma.  Immunostain for WT1 is also diffusely positive in the tumor cells, most consistent with an ovarian primary.  Dr. Kenyon Ana reviewed the case and concurs with the above diagnosis.    07/30/2022 Procedure   Successful right chest port placement via the right internal jugular vein. The port is ready for immediate use.   08/02/2022 - 12/28/2022 Chemotherapy   Patient is on Treatment Plan : OVARIAN Carboplatin (AUC 6) + Paclitaxel (175) q21d X 6 Cycles     08/03/2022 Procedure   Successful ultrasound-guided paracentesis yielding 4.1 liters of peritoneal fluid.   08/09/2022 Tumor Marker   Patient's tumor was tested for the following markers: CA-125. Results of the tumor marker test revealed 452.   08/12/2022 Echocardiogram    1. Left ventricular ejection fraction, by estimation, is 40 to 45%. The left ventricle has mildly decreased function. The left ventricle demonstrates global hypokinesis. Left ventricular  diastolic parameters are indeterminate.   2. Right ventricular systolic function is normal. The right ventricular size is normal.   3. The mitral valve is normal in structure. Mild mitral valve regurgitation. No evidence of mitral stenosis.   4. The aortic valve is normal in structure. Aortic valve regurgitation is mild to moderate. Aortic valve sclerosis/calcification is present, without any evidence of aortic stenosis.   5. The inferior vena cava is normal in size with greater than 50% respiratory variability, suggesting right atrial pressure of 3 mmHg.    09/23/2022 Tumor Marker   Patient's tumor was tested for the following markers: CA-125. Results of the tumor marker test revealed 41.3.   10/07/2022 Imaging   1. Compared to the outside abdominopelvic CT of 07/19/2022, significant improvement in peritoneal carcinomatosis, without bowel obstruction or other acute complication. 2. Decreased size of upper abdominal nodes, possibly also representing response to therapy. 3. No new or progressive disease and no evidence of thoracic metastasis. 4. Bladder wall thickening and mucosal hyperenhancement are suspicious for cystitis. 5. 4 mm left upper lobe pulmonary nodule is most likely benign/incidental but can be re-evaluated at follow-up. 6. Aortic valvular calcifications. Consider echocardiography to evaluate for valvular dysfunction. 7. Coronary artery atherosclerosis. Aortic Atherosclerosis (ICD10-I70.0).     11/11/2022 Surgery   Exploratory laparotomy, lysis of adhesions for approximately 45 minutes, total omentectomy with radical tumor debulking including resection of perigastric nodule, excision and fulguration of multiple small bowel mesenteric nodules, resection of multiple sigmoid epiploica, fulguration of peritoneal lesions along bilateral pelvic sidewalls, peritoneal stripping of bladder peritoneum   Findings: On bimanual exam, no masses appreciated at the vaginal cuff, no nodularity.   On intra-abdominal  entry, infracolic omentum retracted and involved by residual tumor implant measuring approximately 10 x 4 cm.  Nodularity palpated almost up to the splenic flexure.  Supracolic omentum involved to within 2 cm of the greater curvature of the stomach.  Additional 2 x 3 cm implant noted lateral and inferior to the gastric pylorus (sitting anterior to the duodenum and just inferior to the gallbladder).  Ascending and transverse colon normal in appearance.  Small bowel run from the ileocecal valve to the ligament of Treitz.  Rare small nodules within the mesentery noted.  These were all either excised or fulgurated.  Right aspect of the liver and diaphragm smooth to palpation, no visible lesions.  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  No ascites.  Within the pelvis, minimal nodularity seen along bilateral pelvic sidewalls near the infundibulopelvic remnants, suspected to be related to prior hysterectomy and BSO.  Several sigmoid epiploica with tumor implants versus treated tumor, all excised fulgurated.  No nodularity within the deep pelvis although some evidence of what appeared to be treated tumor versus tumor rind involving the bladder peritoneum was stripped in this location.  Biopsies taken from the posterior cul-de-sac. R0 resection at the end of surgery.   11/11/2022 Pathology Results   A. SMALL BOWEL MESENTARY NODULES: -  Densely fibrotic soft tissue/scar with abundant foamy histiocytes, negative for malignancy.  B. 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  C. PERIGASTRIC IMPLANTS, EXCISION: -  High-grade serous carcinoma.  D. SIGMOID EPIPLOICA, EXCISION: High-grade serous carcinoma in the background of dense  stromal fibrosis/scar, calcifications and foamy histiocytes.  E. BLADDER PERITONEUM, EXCISION: -  High-grade serous carcinoma.  F. CUL DE SAC, POSTERIOR, BIOPSY: -  Focal high-grade serous carcinoma in the background of predominantly broad and foamy histiocytes.  Note: It is noted that the patient had a diagnosis of high-grade serous carcinoma from a cytology of the ascites in September 2023 and was clinically staged as cT3c.  Patient is now status post neoadjuvant chemotherapy.  It is noted that this is a debulking surgery.  No additional testing is performed to reserve tissue for clinician directed testing.    12/08/2022 Tumor Marker   Patient's tumor was tested for the following markers: CA-125. Results of the tumor marker test revealed 10.2.   02/03/2023 Imaging   1. Compared to 10/06/2022, interval omentectomy and peritoneal stripping. No residual omental thickening/nodularity. Scattered areas of mesenteric stranding may be postsurgical. 2. No new metastatic disease in the abdomen or pelvis. 3. Unchanged 5 mm enhancing focus along the anterior pancreatic body/tail compared to 07/19/2022. Differential includes intrapancreatic spleen or neoplasm, including small neuroendocrine tumor. Given the size of this lesion, evaluation by MRI would likely be suboptimal. 4. Aortic Atherosclerosis (ICD10-I70.0). Coronary artery calcifications. Assessment for potential risk factor modification, dietary therapy or pharmacologic therapy may be warranted, if clinically indicated.   02/03/2023 Tumor Marker   Patient's tumor was tested for the following markers: CA-125. Results of the tumor marker test revealed 7.8.   02/14/2023 -  Chemotherapy   She started taking olaparib   03/10/2023 Tumor Marker   Patient's tumor was tested for the following markers: CA-125. Results of the tumor marker test revealed 7.6.   04/08/2023 Tumor Marker   Patient's tumor was tested for the following markers:  CA-125. Results of the tumor marker test  revealed 8.8.   06/08/2023 Tumor Marker   Patient's tumor was tested for the following markers: CA-125. Results of the tumor marker test revealed 9.4.   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 * Tracking Code: BO * EXAM: CT CHEST, ABDOMEN, AND PELVIS WITH CONTRAST TECHNIQUE: Multidetector CT imaging of the chest, abdomen and pelvis was performed following the standard protocol during bolus administration of intravenous contrast. RADIATION DOSE REDUCTION: This exam was performed according to the departmental dose-optimization program which includes automated exposure control, adjustment of the mA and/or kV according to patient size and/or use of iterative reconstruction technique. CONTRAST:  OMNIPAQUE IOHEXOL 300 MG/ML  SOLN COMPARISON:  Multiple priors including CT October 06, 2022 and February 01, 2023. FINDINGS: CT CHEST FINDINGS Cardiovascular: Accessed right chest Port-A-Cath with tip near the superior cavoatrial junction. Aortic atherosclerosis. Normal size heart. No significant pericardial effusion/thickening. Calcifications of the aortic valve and annulus. Coronary artery calcifications. Mediastinum/Nodes: No suspicious thyroid nodule. Calcified mediastinal lymph nodes. No pathologically enlarged mediastinal, hilar or axillary lymph nodes. Retained versus refluxed contrast in a patulous esophagus. Lungs/Pleura: Biapical pleuroparenchymal scarring. Scattered areas of scarring versus atelectasis. Stable 4 mm left upper lobe pulmonary nodule on image 35/4. No new suspicious pulmonary nodules or masses. Musculoskeletal: Bilateral breast prostheses. No aggressive lytic or blastic lesion of bone. Multilevel degenerative change of the spine. CT ABDOMEN PELVIS FINDINGS Hepatobiliary:  No suspicious hepatic lesion. Gallbladder is unremarkable. No biliary ductal dilation. Pancreas: No pancreatic ductal dilation or evidence of acute inflammation. Hyperenhancing focus along the anterior margin of the pancreas measuring 5 mm on image 53/2 is unchanged. Spleen: Prior splenectomy. Adrenals/Urinary Tract: Bilateral adrenal glands appear normal. No hydronephrosis. Kidneys demonstrate symmetric enhancement. Urinary bladder is unremarkable for degree of distension. Stomach/Bowel: Radiopaque enteric contrast material traverses distal loops of small bowel. Stomach is unremarkable for degree of distension. No pathologic dilation of small or large bowel. Colonic diverticulosis without findings of acute diverticulitis. Vascular/Lymphatic: Aortic atherosclerosis. Normal caliber abdominal aorta. Smooth IVC contours. The portal, splenic and superior mesenteric veins are patent. No pathologically enlarged abdominal or pelvic lymph nodes. Reproductive: Prior hysterectomy and salpingo oophorectomy without suspicious nodularity along the vaginal cuff or in the adnexa. Other: Prior omentectomy and peritoneal stripping. Similar minimal mesenteric/peritoneal stranding. Similar pelvic peritoneal thickening for instance on image 100/2. No free fluid. No new discrete peritoneal or omental nodularity. Postsurgical change in the anterior abdominal wall. Small fat containing peri-incisional hernia. Musculoskeletal: No aggressive lytic or blastic lesion of bone. IMPRESSION: 1. Prior hysterectomy and salpingo-oophorectomy without evidence of local recurrence. 2. Prior omentectomy and peritoneal stripping with similar minimal mesenteric/peritoneal stranding and pelvic peritoneal thickening, likely postsurgical. No new discrete peritoneal or omental nodularity. 3. Stable 4 mm left upper lobe pulmonary nodule. No new suspicious pulmonary nodules or masses. 4. No evidence of new or progressive disease in the chest, abdomen or pelvis.  5. Stable 5 mm hyperenhancing focus along the anterior margin of the pancreas, differential includes intrapancreatic spleen versus neoplasm including small neuroendocrine tumor. Given small size MRI would likely be of limited utility. Suggest continued attention on follow-up imaging. 6.  Aortic Atherosclerosis (ICD10-I70.0). Electronically Signed   By: Maudry Mayhew M.D.   On: 08/02/2023 14:56      08/03/2023 Tumor Marker   Patient's tumor was tested for the following markers: CA-125. Results of the tumor  marker test revealed 8.   09/28/2023 Tumor Marker   Patient's tumor was tested for the following markers: CA-125. Results of the tumor marker test revealed 7.9.     Interval History: Overall doing well.  Has some symptoms related to the upper aspect of her incision.  Working out 4 times a week.  When she does ab exercises, can see and feel a bulge.  Denies any pain related to her incision.  Denies any abdominal or pelvic pain.  Reports baseline bowel bladder function.  Denies any vaginal bleeding.  Her brother died in Jun 15, 2023 this year and her mother in October.  She endorses some situational depression and anxiety.  Tolerating PARPi with mild nausea if she takes the medication on an empty stomach.  Past Medical/Surgical History: Past Medical History:  Diagnosis Date   Anemia    BRCA2 gene mutation positive 01/08/2019   Breast cancer (HCC) 2007   Left   GERD (gastroesophageal reflux disease)    Hodgkin's disease (HCC) 10/26/1987   clinical   Hypothyroidism    Malignant tumor of breast (HCC)    left   Pneumonia    Thyroid dysfunction    2020, as results on Hodgkin's radiation    Past Surgical History:  Procedure Laterality Date   BREAST RECONSTRUCTION     2009   COLONOSCOPY     2017   DEBULKING N/A 11/11/2022   Procedure: TUMOR DEBULKING;  Surgeon: Carver Fila, MD;  Location: [facility];  Service: Gynecology;  Laterality: N/A;   excisional of bilateral breast     2007    Hysteroscopy w.biopsy     2012   IR IMAGING GUIDED PORT INSERTION  07/29/2022   IR PARACENTESIS  07/23/2022   KNEE ARTHROSCOPY     1985   laparoscopically assisted vaginal hysterectomy w/removal of tubes and/or ovaries  04/23/2019   LYMPH NODE BIOPSY  1989   MASTECTOMY Bilateral 2007   OMENTECTOMY N/A 11/11/2022   Procedure: OPEN OMENTECTOMY, PERITONEAL STRIPPING,  LYSIS OF ADHESIONS;  Surgeon: Carver Fila, MD;  Location: [facility];  Service: Gynecology;  Laterality: N/A;   removal of spleen total     1989   RIGHT/LEFT HEART CATH AND CORONARY ANGIOGRAPHY N/A 10/28/2022   Procedure: RIGHT/LEFT HEART CATH AND CORONARY ANGIOGRAPHY;  Surgeon: [name], MD;  Location: [facility];  Service: Cardiovascular;  Laterality: N/A;    Family History  Problem Relation Age of Onset   Prostate cancer Father    Prostate cancer Brother    Colon cancer Maternal Grandmother    Breast cancer Paternal Grandmother    Ovarian cancer Neg Hx    Endometrial cancer Neg Hx    Pancreatic cancer Neg Hx     Social History   Socioeconomic History   Marital status: Married    Spouse name: Not on file   Number of children: Not on file   Years of education: Not on file   Highest education level: Not on file  Occupational History   Occupation: retired  Tobacco Use   Smoking status: Never   Smokeless tobacco: Never  Vaping Use   Vaping status: Never Used  Substance and Sexual Activity   Alcohol use: Yes    Comment: rare   Drug use: Never   Sexual activity: Yes  Other Topics Concern   Not on file  Social History Narrative   Not on file   Social Drivers of Health   Financial Resource Strain: Low Risk  (  08/02/2022)   Overall Financial Resource Strain (CARDIA)    Difficulty of Paying Living Expenses: Not very hard  Food Insecurity: No Food Insecurity (11/11/2022)   Hunger Vital Sign    Worried About Running Out of Food in the Last Year: Never true    Ran Out of Food in the Last Year:  Never true  Transportation Needs: No Transportation Needs (11/11/2022)   PRAPARE - Administrator, Civil Service (Medical): No    Lack of Transportation (Non-Medical): No  Physical Activity: Not on file  Stress: Not on file  Social Connections: Unknown (07/19/2022)   Received from [facility], [facility]   Social Network    Social Network: Not on file    Current Medications:  Current Outpatient Medications:    aspirin 81 MG chewable tablet, Chew 81 mg by mouth daily., Disp: , Rfl:    Cholecalciferol (VITAMIN D-3) 125 MCG (5000 UT) TABS, Take by mouth., Disp: , Rfl:    levothyroxine (SYNTHROID) 125 MCG tablet, Take 125 mcg by mouth daily before breakfast., Disp: , Rfl:    olaparib (LYNPARZA) 100 MG tablet, TAKE 2 TABLETS (200 MG TOTAL) TWICE A DAY. SWALLOW WHOLE. MAY TAKE WITH FOOD TO DECREASE NAUSEA AND VOMITING., Disp: 120 tablet, Rfl: 9   pantoprazole (PROTONIX) 40 MG tablet, Take 40 mg by mouth daily before breakfast., Disp: , Rfl:    rosuvastatin (CRESTOR) 5 MG tablet, Take 1 tablet (5 mg total) by mouth daily., Disp: 90 tablet, Rfl: 3   vitamin B-12 (CYANOCOBALAMIN) 500 MCG tablet, Take 500 mcg by mouth every evening., Disp: , Rfl:   Review of Systems: Denies appetite changes, fevers, chills, fatigue, unexplained weight changes. Denies hearing loss, neck lumps or masses, mouth sores, ringing in ears or voice changes. Denies cough or wheezing.  Denies shortness of breath. Denies chest pain or palpitations. Denies leg swelling. Denies abdominal distention, pain, blood in stools, constipation, diarrhea, nausea, vomiting, or early satiety. Denies pain with intercourse, dysuria, frequency, hematuria or incontinence. Denies hot flashes, pelvic pain, vaginal bleeding or vaginal discharge.   Denies joint pain, back pain or muscle pain/cramps. Denies itching, rash, or wounds. Denies dizziness, headaches, numbness or seizures. Denies swollen lymph nodes or glands, denies  easy bruising or bleeding. Denies confusion, or decreased concentration.  Physical Exam: BP 126/68 (BP Location: Right Arm, Patient Position: Sitting, Cuff Size: Normal)   Pulse 97   Temp 97.9 F (36.6 C) (Oral)   Resp 18   Ht 5' 2.2" (1.58 m)   Wt 170 lb 9.6 oz (77.4 kg)   SpO2 100%   BMI 31.00 kg/m  General: Alert, oriented, no acute distress. HEENT: Normocephalic, atraumatic, sclera anicteric. Chest: Clear to auscultation bilaterally.  No wheezes or rhonchi. Cardiovascular: Regular rate and rhythm, no murmurs. Abdomen: soft, nontender.  Normoactive bowel sounds.  No masses or hepatosplenomegaly appreciated.  3-4 cm bulge with Valsalva along the right lateral portion of the incision at the level of the umbilicus, nontender. Extremities: Grossly normal range of motion.  Warm, well perfused.  No edema bilaterally. Skin: No rashes or lesions noted. Lymphatics: No cervical, supraclavicular, or inguinal adenopathy. GU: Normal appearing external genitalia without erythema, excoriation, or lesions.  Speculum exam reveals moderately atrophic vaginal mucosa, no lesions.  Bimanual exam reveals cuff intact, no masses or nodularity.    Laboratory & Radiologic Studies:          Component Ref Range & Units (hover) 3 wk ago 2 mo ago 4 mo  ago 6 mo ago 7 mo ago 8 mo ago 10 mo ago  Cancer Antigen (CA) 125 7.9 8.0 CM 9.4 CM 8.8 CM 7.6 CM 7.8 CM 10.2      Assessment & Plan: Lauren Chandler is a 57 y.o. 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 Now on Lynparza for maintenance in the setting of BRCA2 mutation.   The patient is doing very well.  She is NED based on exam and her last imaging.  Recent CA-125 was normal.   She now on Lynparza for maintenance and tolerating this without any significant side effects.  Most recent scan in October  showed stable findings.  Discussed physical exam findings consistent with a small hernia.  On review of her most recent CT scan, there is radiographic evidence of a hernia.  She has raced relatively asymptomatic.  Discussed option of sending her to a surgeon to discuss repair.  She is not interested in this at this time.  Discussed precautions if she were to develop significant pain or a bulge that cannot be reduced.  Given some situational depression and anxiety, offered support today.  Suggested referral to our licensed clinical social worker.  Patient was open to this and referral placed.   NCCN surveillance recommendations, we will continue with follow-up visits every 2-4 months.  I will plan to see her in 3 months.    22 minutes of total time was spent for this patient encounter, including preparation, face-to-face counseling with the patient and coordination of care, and documentation of the encounter.  Eugene Garnet, MD  Division of Gynecologic Oncology  Department of Obstetrics and Gynecology  [facility]

## 2023-10-21 ENCOUNTER — Ambulatory Visit: Payer: BC Managed Care – PPO | Admitting: Gynecologic Oncology

## 2023-10-24 ENCOUNTER — Telehealth: Payer: Self-pay | Admitting: Licensed Clinical Social Worker

## 2023-10-24 NOTE — Telephone Encounter (Signed)
[facility] Clinical Social Work  Clinical Social Work was referred by medical provider for assessment of psychosocial needs.  Clinical Social Worker contacted patient by phone to offer support and assess for needs.   Patient was out with family and unable to speak at this time. CSW provided direct contact information and pt will call back when she has availability.     Lauren Chandler E Lauren Gorley, LCSW  Clinical Social Worker [facility]

## 2023-11-22 ENCOUNTER — Inpatient Hospital Stay: Payer: BC Managed Care – PPO | Attending: Gynecologic Oncology | Admitting: Hematology and Oncology

## 2023-11-22 ENCOUNTER — Inpatient Hospital Stay: Payer: BC Managed Care – PPO

## 2023-11-22 ENCOUNTER — Inpatient Hospital Stay: Payer: BC Managed Care – PPO | Attending: Gynecologic Oncology

## 2023-11-22 ENCOUNTER — Encounter: Payer: Self-pay | Admitting: Hematology and Oncology

## 2023-11-22 VITALS — BP 109/74 | HR 91 | Temp 98.2°F | Resp 18 | Ht 62.2 in | Wt 171.5 lb

## 2023-11-22 DIAGNOSIS — Z79890 Hormone replacement therapy (postmenopausal): Secondary | ICD-10-CM | POA: Insufficient documentation

## 2023-11-22 DIAGNOSIS — T451X5A Adverse effect of antineoplastic and immunosuppressive drugs, initial encounter: Secondary | ICD-10-CM | POA: Diagnosis not present

## 2023-11-22 DIAGNOSIS — Z79899 Other long term (current) drug therapy: Secondary | ICD-10-CM | POA: Insufficient documentation

## 2023-11-22 DIAGNOSIS — Z1501 Genetic susceptibility to malignant neoplasm of breast: Secondary | ICD-10-CM | POA: Insufficient documentation

## 2023-11-22 DIAGNOSIS — C482 Malignant neoplasm of peritoneum, unspecified: Secondary | ICD-10-CM | POA: Insufficient documentation

## 2023-11-22 DIAGNOSIS — D6481 Anemia due to antineoplastic chemotherapy: Secondary | ICD-10-CM | POA: Diagnosis not present

## 2023-11-22 DIAGNOSIS — Z8543 Personal history of malignant neoplasm of ovary: Secondary | ICD-10-CM | POA: Insufficient documentation

## 2023-11-22 DIAGNOSIS — J984 Other disorders of lung: Secondary | ICD-10-CM | POA: Diagnosis not present

## 2023-11-22 DIAGNOSIS — Z1502 Genetic susceptibility to malignant neoplasm of ovary: Secondary | ICD-10-CM | POA: Insufficient documentation

## 2023-11-22 DIAGNOSIS — I2510 Atherosclerotic heart disease of native coronary artery without angina pectoris: Secondary | ICD-10-CM | POA: Diagnosis not present

## 2023-11-22 DIAGNOSIS — K432 Incisional hernia without obstruction or gangrene: Secondary | ICD-10-CM | POA: Diagnosis not present

## 2023-11-22 DIAGNOSIS — I700 Atherosclerosis of aorta: Secondary | ICD-10-CM | POA: Insufficient documentation

## 2023-11-22 DIAGNOSIS — Z9081 Acquired absence of spleen: Secondary | ICD-10-CM | POA: Diagnosis not present

## 2023-11-22 DIAGNOSIS — Z1509 Genetic susceptibility to other malignant neoplasm: Secondary | ICD-10-CM | POA: Insufficient documentation

## 2023-11-22 DIAGNOSIS — C187 Malignant neoplasm of sigmoid colon: Secondary | ICD-10-CM | POA: Diagnosis not present

## 2023-11-22 DIAGNOSIS — I080 Rheumatic disorders of both mitral and aortic valves: Secondary | ICD-10-CM | POA: Insufficient documentation

## 2023-11-22 DIAGNOSIS — K5730 Diverticulosis of large intestine without perforation or abscess without bleeding: Secondary | ICD-10-CM | POA: Insufficient documentation

## 2023-11-22 LAB — CBC WITH DIFFERENTIAL/PLATELET
Abs Immature Granulocytes: 0.02 10*3/uL (ref 0.00–0.07)
Basophils Absolute: 0.1 10*3/uL (ref 0.0–0.1)
Basophils Relative: 1 %
Eosinophils Absolute: 0.1 10*3/uL (ref 0.0–0.5)
Eosinophils Relative: 2 %
HCT: 34.6 % — ABNORMAL LOW (ref 36.0–46.0)
Hemoglobin: 11.8 g/dL — ABNORMAL LOW (ref 12.0–15.0)
Immature Granulocytes: 0 %
Lymphocytes Relative: 36 %
Lymphs Abs: 2.6 10*3/uL (ref 0.7–4.0)
MCH: 35 pg — ABNORMAL HIGH (ref 26.0–34.0)
MCHC: 34.1 g/dL (ref 30.0–36.0)
MCV: 102.7 fL — ABNORMAL HIGH (ref 80.0–100.0)
Monocytes Absolute: 0.5 10*3/uL (ref 0.1–1.0)
Monocytes Relative: 7 %
Neutro Abs: 4 10*3/uL (ref 1.7–7.7)
Neutrophils Relative %: 54 %
Platelets: 331 10*3/uL (ref 150–400)
RBC: 3.37 MIL/uL — ABNORMAL LOW (ref 3.87–5.11)
RDW: 17 % — ABNORMAL HIGH (ref 11.5–15.5)
WBC: 7.2 10*3/uL (ref 4.0–10.5)
nRBC: 8 % — ABNORMAL HIGH (ref 0.0–0.2)

## 2023-11-22 LAB — COMPREHENSIVE METABOLIC PANEL
ALT: 13 U/L (ref 0–44)
AST: 17 U/L (ref 15–41)
Albumin: 4 g/dL (ref 3.5–5.0)
Alkaline Phosphatase: 64 U/L (ref 38–126)
Anion gap: 6 (ref 5–15)
BUN: 25 mg/dL — ABNORMAL HIGH (ref 6–20)
CO2: 30 mmol/L (ref 22–32)
Calcium: 9.6 mg/dL (ref 8.9–10.3)
Chloride: 102 mmol/L (ref 98–111)
Creatinine, Ser: 1.39 mg/dL — ABNORMAL HIGH (ref 0.44–1.00)
GFR, Estimated: 44 mL/min — ABNORMAL LOW (ref >60)
Glucose, Bld: 96 mg/dL (ref 70–99)
Potassium: 4.1 mmol/L (ref 3.5–5.1)
Sodium: 138 mmol/L (ref 135–145)
Total Bilirubin: 0.4 mg/dL (ref 0.0–1.2)
Total Protein: 7.8 g/dL (ref 6.5–8.1)

## 2023-11-22 MED ORDER — SODIUM CHLORIDE 0.9% FLUSH
10.0000 mL | Freq: Once | INTRAVENOUS | Status: AC | PRN
  Administered 2023-11-22: 10 mL

## 2023-11-22 MED ORDER — ALTEPLASE 2 MG IJ SOLR
2.0000 mg | Freq: Once | INTRAMUSCULAR | Status: AC
  Administered 2023-11-22: 2 mg
  Filled 2023-11-22: qty 2

## 2023-11-22 MED ORDER — HEPARIN SOD (PORK) LOCK FLUSH 100 UNIT/ML IV SOLN
500.0000 [IU] | Freq: Once | INTRAVENOUS | Status: AC | PRN
  Administered 2023-11-22: 500 [IU]

## 2023-11-22 NOTE — Assessment & Plan Note (Signed)
She has stable anemia with hemoglobin around 11 I believe this is her baseline We discussed findings of leukoerythroblastic type picture with nucleated RBC seen but overall I believe this could be due to [place] Observe only

## 2023-11-22 NOTE — Assessment & Plan Note (Signed)
Overall, she tolerated Lynparza well without major side effects  We will proceed with treatment without delay Plan to see her again in 2 months for further follow-up Plan to repeat imaging in APril 2025

## 2023-11-22 NOTE — Progress Notes (Signed)
[facility] OFFICE PROGRESS NOTE  Patient Care Team: Artis Delay, MD as PCP - General (Hematology and Oncology) [name], MD as PCP - Cardiology (Cardiology) Charna Elizabeth, MD as Consulting Physician (Gastroenterology)  ASSESSMENT & PLAN:  Primary peritoneal adenocarcinoma (HCC) Overall, she tolerated Garey Ham well without major side effects  We will proceed with treatment without delay Plan to see her again in 2 months for further follow-up Plan to repeat imaging in APril 2025  Anemia due to antineoplastic chemotherapy She has stable anemia with hemoglobin around 11 I believe this is her baseline We discussed findings of leukoerythroblastic type picture with nucleated RBC seen but overall I believe this could be due to [place] Observe only  No orders of the defined types were placed in this encounter.   All questions were answered. The patient knows to call the clinic with any problems, questions or concerns. The total time spent in the appointment was 20 minutes encounter with patients including review of chart and various tests results, discussions about plan of care and coordination of care plan   Artis Delay, MD 11/22/2023 3:34 PM  INTERVAL HISTORY: Please see below for problem oriented charting. she returns for chemo follow-up on olaparib She is doing well No side effects of treatment She is bothered intermittently by the hernia We discussed CBC results Her port is not working well and we discussed risk and benefits of keeping her port  REVIEW OF SYSTEMS:   Constitutional: Denies fevers, chills or abnormal weight loss Eyes: Denies blurriness of vision Ears, nose, mouth, throat, and face: Denies mucositis or sore throat Respiratory: Denies cough, dyspnea or wheezes Cardiovascular: Denies palpitation, chest discomfort or lower extremity swelling Gastrointestinal:  Denies nausea, heartburn or change in bowel habits Skin: Denies abnormal skin  rashes Lymphatics: Denies new lymphadenopathy or easy bruising Neurological:Denies numbness, tingling or new weaknesses Behavioral/Psych: Mood is stable, no new changes  All other systems were reviewed with the patient and are negative.  I have reviewed the past medical history, past surgical history, social history and family history with the patient and they are unchanged from previous note.  ALLERGIES:  has no known allergies.  MEDICATIONS:  Current Outpatient Medications  Medication Sig Dispense Refill   aspirin 81 MG chewable tablet Chew 81 mg by mouth daily.     Cholecalciferol (VITAMIN D-3) 125 MCG (5000 UT) TABS Take by mouth.     levothyroxine (SYNTHROID) 125 MCG tablet Take 125 mcg by mouth daily before breakfast.     olaparib (LYNPARZA) 100 MG tablet TAKE 2 TABLETS (200 MG TOTAL) TWICE A DAY. SWALLOW WHOLE. MAY TAKE WITH FOOD TO DECREASE NAUSEA AND VOMITING. 120 tablet 9   pantoprazole (PROTONIX) 40 MG tablet Take 40 mg by mouth daily before breakfast.     rosuvastatin (CRESTOR) 5 MG tablet Take 1 tablet (5 mg total) by mouth daily. 90 tablet 3   vitamin B-12 (CYANOCOBALAMIN) 500 MCG tablet Take 500 mcg by mouth every evening.     No current facility-administered medications for this visit.    SUMMARY OF ONCOLOGIC HISTORY: Oncology History Overview Note  BRCA2 positive   Primary peritoneal adenocarcinoma (HCC)  07/19/2022 Imaging   1. Small left pleural effusion  2. 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HCC)   07/23/2022 Cancer Staging   Staging form: Ovary, Fallopian Tube, and Primary Peritoneal Carcinoma, AJCC 8th Edition - Clinical stage from 07/23/2022: FIGO Stage IIIC (  cT3c, cN0, cM0) - Signed by Artis Delay, MD on 07/23/2022 Stage prefix: Initial diagnosis   07/23/2022 Pathology Results   FINAL MICROSCOPIC DIAGNOSIS:  - Malignant cells present  - See comment   SPECIMEN  ADEQUACY:  Satisfactory for evaluation   DIAGNOSTIC COMMENTS:  Immunohistochemical stains show that the tumor cells are positive for ER, PAX8, p16 and p53 (clonal overexpression pattern), consistent with a high-grade serous carcinoma.  Immunostain for WT1 is also diffusely positive in the tumor cells, most consistent with an ovarian primary.  Dr. Kenyon Ana reviewed the case and concurs with the above diagnosis.    07/30/2022 Procedure   Successful right chest port placement via the right internal jugular vein. The port is ready for immediate use.   08/02/2022 - 12/28/2022 Chemotherapy   Patient is on Treatment Plan : OVARIAN Carboplatin (AUC 6) + Paclitaxel (175) q21d X 6 Cycles     08/03/2022 Procedure   Successful ultrasound-guided paracentesis yielding 4.1 liters of peritoneal fluid.   08/09/2022 Tumor Marker   Patient's tumor was tested for the following markers: CA-125. Results of the tumor marker test revealed 452.   08/12/2022 Echocardiogram    1. Left ventricular ejection fraction, by estimation, is 40 to 45%. The left ventricle has mildly decreased function. The left ventricle demonstrates global hypokinesis. Left ventricular diastolic parameters are indeterminate.   2. Right ventricular systolic function is normal. The right ventricular size is normal.   3. The mitral valve is normal in structure. Mild mitral valve regurgitation. No evidence of mitral stenosis.   4. The aortic valve is normal in structure. Aortic valve regurgitation is mild to moderate. Aortic valve sclerosis/calcification is present, without any evidence of aortic stenosis.   5. The inferior vena cava is normal in size with greater than 50% respiratory variability, suggesting right atrial pressure of 3 mmHg.    09/23/2022 Tumor Marker   Patient's tumor was tested for the following markers: CA-125. Results of the tumor marker test revealed 41.3.   10/07/2022 Imaging   1. Compared to the outside abdominopelvic CT  of 07/19/2022, significant improvement in peritoneal carcinomatosis, without bowel obstruction or other acute complication. 2. Decreased size of upper abdominal nodes, possibly also representing response to therapy. 3. No new or progressive disease and no evidence of thoracic metastasis. 4. Bladder wall thickening and mucosal hyperenhancement are suspicious for cystitis. 5. 4 mm left upper lobe pulmonary nodule is most likely benign/incidental but can be re-evaluated at follow-up. 6. Aortic valvular calcifications. Consider echocardiography to evaluate for valvular dysfunction. 7. Coronary artery atherosclerosis. Aortic Atherosclerosis (ICD10-I70.0).     11/11/2022 Surgery   Exploratory laparotomy, lysis of adhesions for approximately 45 minutes, total omentectomy with radical tumor debulking including resection of perigastric nodule, excision and fulguration of multiple small bowel mesenteric nodules, resection of multiple sigmoid epiploica, fulguration of peritoneal lesions along bilateral pelvic sidewalls, peritoneal stripping of bladder peritoneum   Findings: On bimanual exam, no masses appreciated at the vaginal cuff, no nodularity.  On intra-abdominal entry, infracolic omentum retracted and involved by residual tumor implant measuring approximately 10 x 4 cm.  Nodularity palpated almost up to the splenic flexure.  Supracolic omentum involved to within 2 cm of the greater curvature of the stomach.  Additional 2 x 3 cm implant noted lateral and inferior to the gastric pylorus (sitting anterior to the duodenum and just inferior to the gallbladder).  Ascending and transverse colon normal in appearance.  Small bowel run from the ileocecal valve to the ligament of  Treitz.  Rare small nodules within the mesentery noted.  These were all either excised or fulgurated.  Right aspect of the liver and diaphragm smooth to palpation, no visible lesions.  Dense adhesions superior to the stomach given prior  splenectomy limiting palpation of the left liver.  No ascites.  Within the pelvis, minimal nodularity seen along bilateral pelvic sidewalls near the infundibulopelvic remnants, suspected to be related to prior hysterectomy and BSO.  Several sigmoid epiploica with tumor implants versus treated tumor, all excised fulgurated.  No nodularity within the deep pelvis although some evidence of what appeared to be treated tumor versus tumor rind involving the bladder peritoneum was stripped in this location.  Biopsies taken from the posterior cul-de-sac. R0 resection at the end of surgery.   11/11/2022 Pathology Results   A. SMALL BOWEL MESENTARY NODULES: -  Densely fibrotic soft tissue/scar with abundant foamy histiocytes, negative for malignancy.  B. 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  C. PERIGASTRIC IMPLANTS, EXCISION: -  High-grade serous carcinoma.  D. SIGMOID EPIPLOICA, EXCISION: High-grade serous carcinoma in the background of dense stromal fibrosis/scar, calcifications and foamy histiocytes.  E. BLADDER PERITONEUM, EXCISION: -  High-grade serous carcinoma.  F. CUL DE SAC, POSTERIOR, BIOPSY: -  Focal high-grade serous carcinoma in the background of predominantly broad and foamy histiocytes.  Note: It is noted that the patient had a diagnosis of high-grade serous carcinoma from a cytology of the ascites in September 2023 and was clinically staged as cT3c.  Patient is now status post neoadjuvant chemotherapy.  It is noted that this is a debulking surgery.  No additional testing is performed to reserve tissue for clinician directed testing.    12/08/2022 Tumor Marker   Patient's tumor was tested for the following markers: CA-125. Results of the tumor marker test revealed 10.2.   02/03/2023 Imaging    1. Compared to 10/06/2022, interval omentectomy and peritoneal stripping. No residual omental thickening/nodularity. Scattered areas of mesenteric stranding may be postsurgical. 2. No new metastatic disease in the abdomen or pelvis. 3. Unchanged 5 mm enhancing focus along the anterior pancreatic body/tail compared to 07/19/2022. Differential includes intrapancreatic spleen or neoplasm, including small neuroendocrine tumor. Given the size of this lesion, evaluation by MRI would likely be suboptimal. 4. Aortic Atherosclerosis (ICD10-I70.0). Coronary artery calcifications. Assessment for potential risk factor modification, dietary therapy or pharmacologic therapy may be warranted, if clinically indicated.   02/03/2023 Tumor Marker   Patient's tumor was tested for the following markers: CA-125. Results of the tumor marker test revealed 7.8.   02/14/2023 -  Chemotherapy   She started taking olaparib   03/10/2023 Tumor Marker   Patient's tumor was tested for the following markers: CA-125. Results of the tumor marker test revealed 7.6.   04/08/2023 Tumor Marker   Patient's tumor was tested for the following markers: CA-125. Results of the tumor marker test revealed 8.8.   06/08/2023 Tumor Marker   Patient's tumor was tested for the following markers: CA-125. Results of the tumor marker test revealed 9.4.   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 * Tracking Code: BO * EXAM: CT CHEST, ABDOMEN, AND PELVIS WITH CONTRAST TECHNIQUE: Multidetector CT imaging of the chest, abdomen and pelvis was performed following  the standard protocol during bolus administration of intravenous contrast. RADIATION DOSE REDUCTION: This exam was performed according to the departmental dose-optimization program which includes automated  exposure control, adjustment of the mA and/or kV according to patient size and/or use of iterative reconstruction technique. CONTRAST:  OMNIPAQUE IOHEXOL 300 MG/ML  SOLN COMPARISON:  Multiple priors including CT October 06, 2022 and February 01, 2023. FINDINGS: CT CHEST FINDINGS Cardiovascular: Accessed right chest Port-A-Cath with tip near the superior cavoatrial junction. Aortic atherosclerosis. Normal size heart. No significant pericardial effusion/thickening. Calcifications of the aortic valve and annulus. Coronary artery calcifications. Mediastinum/Nodes: No suspicious thyroid nodule. Calcified mediastinal lymph nodes. No pathologically enlarged mediastinal, hilar or axillary lymph nodes. Retained versus refluxed contrast in a patulous esophagus. Lungs/Pleura: Biapical pleuroparenchymal scarring. Scattered areas of scarring versus atelectasis. Stable 4 mm left upper lobe pulmonary nodule on image 35/4. No new suspicious pulmonary nodules or masses. Musculoskeletal: Bilateral breast prostheses. No aggressive lytic or blastic lesion of bone. Multilevel degenerative change of the spine. CT ABDOMEN PELVIS FINDINGS Hepatobiliary: No suspicious hepatic lesion. Gallbladder is unremarkable. No biliary ductal dilation. Pancreas: No pancreatic ductal dilation or evidence of acute inflammation. Hyperenhancing focus along the anterior margin of the pancreas measuring 5 mm on image 53/2 is unchanged. Spleen: Prior splenectomy. Adrenals/Urinary Tract: Bilateral adrenal glands appear normal. No hydronephrosis. Kidneys demonstrate symmetric enhancement. Urinary bladder is unremarkable for degree of distension. Stomach/Bowel: Radiopaque enteric contrast material traverses distal loops of small bowel. Stomach is unremarkable for degree of distension. No pathologic dilation of small or large bowel. Colonic diverticulosis without findings of acute diverticulitis. Vascular/Lymphatic: Aortic atherosclerosis. Normal caliber  abdominal aorta. Smooth IVC contours. The portal, splenic and superior mesenteric veins are patent. No pathologically enlarged abdominal or pelvic lymph nodes. Reproductive: Prior hysterectomy and salpingo oophorectomy without suspicious nodularity along the vaginal cuff or in the adnexa. Other: Prior omentectomy and peritoneal stripping. Similar minimal mesenteric/peritoneal stranding. Similar pelvic peritoneal thickening for instance on image 100/2. No free fluid. No new discrete peritoneal or omental nodularity. Postsurgical change in the anterior abdominal wall. Small fat containing peri-incisional hernia. Musculoskeletal: No aggressive lytic or blastic lesion of bone. IMPRESSION: 1. Prior hysterectomy and salpingo-oophorectomy without evidence of local recurrence. 2. Prior omentectomy and peritoneal stripping with similar minimal mesenteric/peritoneal stranding and pelvic peritoneal thickening, likely postsurgical. No new discrete peritoneal or omental nodularity. 3. Stable 4 mm left upper lobe pulmonary nodule. No new suspicious pulmonary nodules or masses. 4. No evidence of new or progressive disease in the chest, abdomen or pelvis. 5. Stable 5 mm hyperenhancing focus along the anterior margin of the pancreas, differential includes intrapancreatic spleen versus neoplasm including small neuroendocrine tumor. Given small size MRI would likely be of limited utility. Suggest continued attention on follow-up imaging. 6.  Aortic Atherosclerosis (ICD10-I70.0). Electronically Signed   By: Maudry Mayhew M.D.   On: 08/02/2023 14:56      08/03/2023 Tumor Marker   Patient's tumor was tested for the following markers: CA-125. Results of the tumor marker test revealed 8.   09/28/2023 Tumor Marker   Patient's tumor was tested for the following markers: CA-125. Results of the tumor marker test revealed 7.9.     PHYSICAL EXAMINATION: ECOG PERFORMANCE STATUS: 0 - Asymptomatic  Vitals:   11/22/23 1349  BP:  109/74  Pulse: 91  Resp: 18  Temp: 98.2 F (36.8 C)  SpO2: 100%   Filed Weights   11/22/23 1349  Weight: 171 lb 8 oz (77.8 kg)    GENERAL:alert, no  distress and comfortable LABORATORY DATA:  I have reviewed the data as listed    Component Value Date/Time   NA 138 11/22/2023 1329   K 4.1 11/22/2023 1329   CL 102 11/22/2023 1329   CO2 30 11/22/2023 1329   GLUCOSE 96 11/22/2023 1329   BUN 25 (H) 11/22/2023 1329   CREATININE 1.39 (H) 11/22/2023 1329   CREATININE 0.94 12/28/2022 0900   CALCIUM 9.6 11/22/2023 1329   PROT 7.8 11/22/2023 1329   PROT 7.4 02/25/2023 0916   ALBUMIN 4.0 11/22/2023 1329   ALBUMIN 3.9 02/25/2023 0916   AST 17 11/22/2023 1329   AST 18 12/28/2022 0900   ALT 13 11/22/2023 1329   ALT 18 12/28/2022 0900   ALKPHOS 64 11/22/2023 1329   BILITOT 0.4 11/22/2023 1329   BILITOT <0.2 02/25/2023 0916   BILITOT 0.2 (L) 12/28/2022 0900   GFRNONAA 44 (L) 11/22/2023 1329   GFRNONAA >60 12/28/2022 0900    No results found for: "SPEP", "UPEP"  Lab Results  Component Value Date   WBC 7.2 11/22/2023   NEUTROABS 4.0 11/22/2023   HGB 11.8 (L) 11/22/2023   HCT 34.6 (L) 11/22/2023   MCV 102.7 (H) 11/22/2023   PLT 331 11/22/2023      Chemistry      Component Value Date/Time   NA 138 11/22/2023 1329   K 4.1 11/22/2023 1329   CL 102 11/22/2023 1329   CO2 30 11/22/2023 1329   BUN 25 (H) 11/22/2023 1329   CREATININE 1.39 (H) 11/22/2023 1329   CREATININE 0.94 12/28/2022 0900      Component Value Date/Time   CALCIUM 9.6 11/22/2023 1329   ALKPHOS 64 11/22/2023 1329   AST 17 11/22/2023 1329   AST 18 12/28/2022 0900   ALT 13 11/22/2023 1329   ALT 18 12/28/2022 0900   BILITOT 0.4 11/22/2023 1329   BILITOT <0.2 02/25/2023 0916   BILITOT 0.2 (L) 12/28/2022 0900

## 2023-11-23 LAB — CA 125: Cancer Antigen (CA) 125: 8.5 U/mL (ref 0.0–38.1)

## 2023-11-24 ENCOUNTER — Telehealth: Payer: Self-pay | Admitting: Hematology and Oncology

## 2023-11-24 NOTE — Telephone Encounter (Signed)
Spoke with patient confirming upcoming appointment

## 2023-12-27 ENCOUNTER — Encounter: Payer: Self-pay | Admitting: Hematology and Oncology

## 2023-12-27 ENCOUNTER — Other Ambulatory Visit (HOSPITAL_COMMUNITY): Payer: Self-pay

## 2024-01-10 ENCOUNTER — Telehealth: Payer: Self-pay | Admitting: Pharmacy Technician

## 2024-01-10 NOTE — Telephone Encounter (Signed)
 Oral Oncology Patient Advocate Encounter   Received notification that prior authorization for Lauren Chandler is due for renewal.   PA submitted on 01/10/24 Key [id number] Status is pending     Lauren Chandler, CPhT-Adv Oncology Pharmacy Patient Advocate [facility]  Direct Number: [phone]  Fax: [phone]

## 2024-01-10 NOTE — Telephone Encounter (Signed)
 Oral Oncology Patient Advocate Encounter  Prior Authorization for Garey Ham has been approved.    PA# [id number] Effective dates:  01/10/24 through 01/09/25  Patient must continue to fill at [facility].   Omer Jack, CPhT-Adv Oncology Pharmacy Patient Advocate [facility]  Direct Number: [phone]  Fax: [phone]

## 2024-01-19 ENCOUNTER — Inpatient Hospital Stay: Payer: BC Managed Care – PPO | Attending: Gynecologic Oncology | Admitting: Hematology and Oncology

## 2024-01-19 ENCOUNTER — Encounter: Payer: Self-pay | Admitting: Hematology and Oncology

## 2024-01-19 ENCOUNTER — Inpatient Hospital Stay: Payer: BC Managed Care – PPO

## 2024-01-19 ENCOUNTER — Other Ambulatory Visit: Payer: Self-pay

## 2024-01-19 VITALS — BP 123/56 | HR 105 | Temp 98.1°F | Resp 18 | Ht 62.2 in | Wt 174.0 lb

## 2024-01-19 DIAGNOSIS — C482 Malignant neoplasm of peritoneum, unspecified: Secondary | ICD-10-CM

## 2024-01-19 DIAGNOSIS — K089 Disorder of teeth and supporting structures, unspecified: Secondary | ICD-10-CM | POA: Diagnosis not present

## 2024-01-19 DIAGNOSIS — D6481 Anemia due to antineoplastic chemotherapy: Secondary | ICD-10-CM | POA: Diagnosis not present

## 2024-01-19 DIAGNOSIS — Z8571 Personal history of Hodgkin lymphoma: Secondary | ICD-10-CM | POA: Diagnosis not present

## 2024-01-19 DIAGNOSIS — Z79899 Other long term (current) drug therapy: Secondary | ICD-10-CM | POA: Diagnosis not present

## 2024-01-19 DIAGNOSIS — Z170 Estrogen receptor positive status [ER+]: Secondary | ICD-10-CM | POA: Diagnosis not present

## 2024-01-19 DIAGNOSIS — Z853 Personal history of malignant neoplasm of breast: Secondary | ICD-10-CM | POA: Insufficient documentation

## 2024-01-19 DIAGNOSIS — T451X5A Adverse effect of antineoplastic and immunosuppressive drugs, initial encounter: Secondary | ICD-10-CM | POA: Insufficient documentation

## 2024-01-19 LAB — CBC WITH DIFFERENTIAL/PLATELET
Abs Immature Granulocytes: 0.02 10*3/uL (ref 0.00–0.07)
Basophils Absolute: 0.1 10*3/uL (ref 0.0–0.1)
Basophils Relative: 1 %
Eosinophils Absolute: 0.2 10*3/uL (ref 0.0–0.5)
Eosinophils Relative: 2 %
HCT: 32.5 % — ABNORMAL LOW (ref 36.0–46.0)
Hemoglobin: 11.1 g/dL — ABNORMAL LOW (ref 12.0–15.0)
Immature Granulocytes: 0 %
Lymphocytes Relative: 31 %
Lymphs Abs: 2.8 10*3/uL (ref 0.7–4.0)
MCH: 34.9 pg — ABNORMAL HIGH (ref 26.0–34.0)
MCHC: 34.2 g/dL (ref 30.0–36.0)
MCV: 102.2 fL — ABNORMAL HIGH (ref 80.0–100.0)
Monocytes Absolute: 0.6 10*3/uL (ref 0.1–1.0)
Monocytes Relative: 7 %
Neutro Abs: 5.1 10*3/uL (ref 1.7–7.7)
Neutrophils Relative %: 59 %
Platelets: 361 10*3/uL (ref 150–400)
RBC: 3.18 MIL/uL — ABNORMAL LOW (ref 3.87–5.11)
RDW: 17 % — ABNORMAL HIGH (ref 11.5–15.5)
WBC: 8.8 10*3/uL (ref 4.0–10.5)
nRBC: 6.9 % — ABNORMAL HIGH (ref 0.0–0.2)

## 2024-01-19 LAB — COMPREHENSIVE METABOLIC PANEL WITH GFR
ALT: 14 U/L (ref 0–44)
AST: 17 U/L (ref 15–41)
Albumin: 3.9 g/dL (ref 3.5–5.0)
Alkaline Phosphatase: 71 U/L (ref 38–126)
Anion gap: 6 (ref 5–15)
BUN: 19 mg/dL (ref 6–20)
CO2: 28 mmol/L (ref 22–32)
Calcium: 9.1 mg/dL (ref 8.9–10.3)
Chloride: 104 mmol/L (ref 98–111)
Creatinine, Ser: 1.04 mg/dL — ABNORMAL HIGH (ref 0.44–1.00)
GFR, Estimated: >60 mL/min (ref >60)
Glucose, Bld: 87 mg/dL (ref 70–99)
Potassium: 3.9 mmol/L (ref 3.5–5.1)
Sodium: 138 mmol/L (ref 135–145)
Total Bilirubin: 0.3 mg/dL (ref 0.0–1.2)
Total Protein: 7.6 g/dL (ref 6.5–8.1)

## 2024-01-19 MED ORDER — HEPARIN SOD (PORK) LOCK FLUSH 100 UNIT/ML IV SOLN
500.0000 [IU] | Freq: Once | INTRAVENOUS | Status: AC
  Administered 2024-01-19: 500 [IU]

## 2024-01-19 MED ORDER — SODIUM CHLORIDE 0.9% FLUSH
10.0000 mL | Freq: Once | INTRAVENOUS | Status: AC
Start: 2024-01-19 — End: 2024-01-19
  Administered 2024-01-19: 10 mL

## 2024-01-19 NOTE — Assessment & Plan Note (Addendum)
She has stable anemia with hemoglobin around 11 I believe this is her baseline Observe only

## 2024-01-19 NOTE — Assessment & Plan Note (Addendum)
 She has remote history of Hodgkin lymphoma and breast cancer She was diagnosed with stage III primary peritoneal cancer in 2023 treated with neoadjuvant chemotherapy followed by surgery and completion of chemotherapy by March 2024 Pathology high-grade serous carcinoma, ER positive, p53 mutated, BRCA2 positive  She is doing well on maintenance olaparib with minimal side effects We will continue treatment for minimum of 2 years, to be completed by April 2026 I plan to repeat imaging study next month for objective assessment of response to therapy

## 2024-01-19 NOTE — Progress Notes (Signed)
 [facility] OFFICE PROGRESS NOTE  Patient Care Team: Artis Delay, MD as PCP - General (Hematology and Oncology) [name], MD as PCP - Cardiology (Cardiology) Charna Elizabeth, MD as Consulting Physician (Gastroenterology)  Assessment & Plan Primary peritoneal adenocarcinoma [facility]) She has remote history of Hodgkin lymphoma and breast cancer She was diagnosed with stage III primary peritoneal cancer in 2023 treated with neoadjuvant chemotherapy followed by surgery and completion of chemotherapy by March 2024 Pathology high-grade serous carcinoma, ER positive, p53 mutated, BRCA2 positive  She is doing well on maintenance olaparib with minimal side effects We will continue treatment for minimum of 2 years, to be completed by April 2026 I plan to repeat imaging study next month for objective assessment of response to therapy Poor dentition I received a letter from her dentist to get dental clearance She needs 6 teeth extracted in the near future with conscious sedation I think she have reasonable good health and there is no contraindication from me for her to proceed with the procedure as indicated.  I gave her verbal and written instruction to stop Olaparib for 5 days prior to surgery and to resume approximately 48 hours after her surgery We will fax the letter to her dentist Anemia due to antineoplastic chemotherapy She has stable anemia with hemoglobin around 11 I believe this is her baseline Observe only  Orders Placed This Encounter  Procedures   CT ABDOMEN PELVIS W CONTRAST    Standing Status:   Future    Expected Date:   03/01/2024    Expiration Date:   01/18/2025    Scheduling Instructions:     No need oral contrast    If indicated for the ordered procedure, I authorize the administration of contrast media per Radiology protocol:   Yes    Does the patient have a contrast media/X-ray dye allergy?:   No    Is patient pregnant?:   No    Preferred imaging  location?:   [facility]    If indicated for the ordered procedure, I authorize the administration of oral contrast media per Radiology protocol:   No    Reason for no oral contrast::   No need oral contrast     Artis Delay, MD  INTERVAL HISTORY: she returns for treatment follow-up Complications related to previous cycle of chemotherapy included anemia, She is bothered by minor hernia at her incision site Examination is benign I reviewed prior CT imaging and reassured the patient I also spent some time typing out a letter of clearance to be sent to her dentist  PHYSICAL EXAMINATION: ECOG PERFORMANCE STATUS: 0 - Asymptomatic  Vitals:   01/19/24 1314  BP: (!) 123/56  Pulse: (!) 105  Resp: 18  Temp: 98.1 F (36.7 C)  SpO2: 100%   Filed Weights   01/19/24 1314  Weight: 174 lb (78.9 kg)    Relevant data reviewed during this visit included CBC and CMP

## 2024-01-19 NOTE — Assessment & Plan Note (Addendum)
 I received a letter from her dentist to get dental clearance She needs 6 teeth extracted in the near future with conscious sedation I think she have reasonable good health and there is no contraindication from me for her to proceed with the procedure as indicated.  I gave her verbal and written instruction to stop Olaparib for 5 days prior to surgery and to resume approximately 48 hours after her surgery We will fax the letter to her dentist

## 2024-01-19 NOTE — Progress Notes (Signed)
 Faxed letter regarding dental procedure from Dr. Bertis Ruddy to [phone], received fax confirmation.

## 2024-01-20 LAB — CA 125: Cancer Antigen (CA) 125: 27.6 U/mL (ref 0.0–38.1)

## 2024-01-24 ENCOUNTER — Telehealth: Payer: Self-pay

## 2024-01-24 DIAGNOSIS — Z9013 Acquired absence of bilateral breasts and nipples: Secondary | ICD-10-CM | POA: Diagnosis not present

## 2024-01-24 DIAGNOSIS — Z853 Personal history of malignant neoplasm of breast: Secondary | ICD-10-CM | POA: Diagnosis not present

## 2024-01-24 DIAGNOSIS — C482 Malignant neoplasm of peritoneum, unspecified: Secondary | ICD-10-CM | POA: Diagnosis not present

## 2024-01-24 NOTE — Telephone Encounter (Signed)
 Attempted to call pt per MD. LVM for call back.

## 2024-01-24 NOTE — Telephone Encounter (Signed)
-----   Message from Artis Delay sent at 01/24/2024  9:22 AM EDT ----- Hi,  Her CA-125 suddenly showed a big jump I recommend CT imaging in 1 week rather than wait till May She has the paper to call Tell her to call and schedule it for 4/8 (we need at least 1 week to work on [place]) I will see her on 4/21 (I will send LOS)  Thanks

## 2024-01-24 NOTE — Telephone Encounter (Signed)
 Pt returned call. She is in agreement to have CT moved up. She states she will call central scheduling and let us know when it is scheduled so we can schedule MD f/u

## 2024-01-25 NOTE — Telephone Encounter (Signed)
 Hi Lauren Chandler,   Please keep an eye on her appt I sent LOS to see her back Monday 4/21 at 1220

## 2024-01-27 ENCOUNTER — Telehealth: Payer: Self-pay

## 2024-01-27 NOTE — Telephone Encounter (Signed)
 Called and scheduled appt w/ Dr. Bertis Ruddy on 4/21 at 1220. She is aware of appt.

## 2024-01-31 ENCOUNTER — Ambulatory Visit (HOSPITAL_COMMUNITY)
Admission: RE | Admit: 2024-01-31 | Discharge: 2024-01-31 | Disposition: A | Discharge status: Home / Self Care | Source: Ambulatory Visit | Attending: Hematology and Oncology | Admitting: Hematology and Oncology

## 2024-01-31 ENCOUNTER — Encounter: Payer: Self-pay | Admitting: Gynecologic Oncology

## 2024-01-31 ENCOUNTER — Encounter: Payer: Self-pay | Admitting: Hematology and Oncology

## 2024-01-31 ENCOUNTER — Telehealth: Payer: Self-pay | Admitting: Oncology

## 2024-01-31 DIAGNOSIS — Z90710 Acquired absence of both cervix and uterus: Secondary | ICD-10-CM | POA: Diagnosis not present

## 2024-01-31 DIAGNOSIS — C482 Malignant neoplasm of peritoneum, unspecified: Secondary | ICD-10-CM | POA: Insufficient documentation

## 2024-01-31 DIAGNOSIS — K5730 Diverticulosis of large intestine without perforation or abscess without bleeding: Secondary | ICD-10-CM | POA: Diagnosis not present

## 2024-01-31 MED ORDER — HEPARIN SOD (PORK) LOCK FLUSH 100 UNIT/ML IV SOLN
500.0000 [IU] | Freq: Once | INTRAVENOUS | Status: AC
  Administered 2024-01-31: 100 [IU] via INTRAVENOUS

## 2024-01-31 MED ORDER — IOHEXOL 300 MG/ML  SOLN
100.0000 mL | Freq: Once | INTRAMUSCULAR | Status: AC | PRN
  Administered 2024-01-31: 100 mL via INTRAVENOUS

## 2024-01-31 MED ORDER — IOHEXOL 9 MG/ML PO SOLN
1000.0000 mL | ORAL | Status: AC
  Administered 2024-01-31: 1000 mL via ORAL

## 2024-01-31 MED ORDER — IOHEXOL 9 MG/ML PO SOLN
ORAL | Status: AC
  Filled 2024-01-31: qty 1000

## 2024-01-31 MED ORDER — SODIUM CHLORIDE (PF) 0.9 % IJ SOLN
INTRAMUSCULAR | Status: AC
  Filled 2024-01-31: qty 50

## 2024-01-31 MED ORDER — HEPARIN SOD (PORK) LOCK FLUSH 100 UNIT/ML IV SOLN
INTRAVENOUS | Status: AC
  Filled 2024-01-31: qty 5

## 2024-01-31 NOTE — Telephone Encounter (Signed)
 Called Lauren Chandler and let her know that her port can be accessed in radiology and that she should be NPO 4 hours before the scan.  She verbalized understanding and agreement.

## 2024-02-13 ENCOUNTER — Encounter: Payer: Self-pay | Admitting: Hematology and Oncology

## 2024-02-13 ENCOUNTER — Inpatient Hospital Stay: Attending: Gynecologic Oncology | Admitting: Hematology and Oncology

## 2024-02-13 VITALS — BP 126/80 | HR 102 | Temp 98.7°F | Resp 17 | Ht 62.0 in | Wt 172.8 lb

## 2024-02-13 DIAGNOSIS — K089 Disorder of teeth and supporting structures, unspecified: Secondary | ICD-10-CM | POA: Diagnosis not present

## 2024-02-13 DIAGNOSIS — Z9221 Personal history of antineoplastic chemotherapy: Secondary | ICD-10-CM | POA: Diagnosis not present

## 2024-02-13 DIAGNOSIS — K432 Incisional hernia without obstruction or gangrene: Secondary | ICD-10-CM | POA: Insufficient documentation

## 2024-02-13 DIAGNOSIS — Z1501 Genetic susceptibility to malignant neoplasm of breast: Secondary | ICD-10-CM | POA: Insufficient documentation

## 2024-02-13 DIAGNOSIS — Z148 Genetic carrier of other disease: Secondary | ICD-10-CM | POA: Diagnosis not present

## 2024-02-13 DIAGNOSIS — C482 Malignant neoplasm of peritoneum, unspecified: Secondary | ICD-10-CM | POA: Insufficient documentation

## 2024-02-13 DIAGNOSIS — Z1509 Genetic susceptibility to other malignant neoplasm: Secondary | ICD-10-CM | POA: Insufficient documentation

## 2024-02-13 DIAGNOSIS — Z8571 Personal history of Hodgkin lymphoma: Secondary | ICD-10-CM | POA: Diagnosis not present

## 2024-02-13 DIAGNOSIS — Z79899 Other long term (current) drug therapy: Secondary | ICD-10-CM | POA: Diagnosis not present

## 2024-02-13 DIAGNOSIS — K439 Ventral hernia without obstruction or gangrene: Secondary | ICD-10-CM | POA: Diagnosis not present

## 2024-02-13 MED ORDER — LIDOCAINE-PRILOCAINE 2.5-2.5 % EX CREA: 1.0000 | TOPICAL_CREAM | CUTANEOUS | 3 refills | Status: DC | PRN

## 2024-02-13 NOTE — Assessment & Plan Note (Addendum)
 The palpable mass on her abdominal wall is due to her abdominal wall hernia We discussed importance of dietary modification and weight loss to reduce progression of her abdominal wall hernia

## 2024-02-13 NOTE — Assessment & Plan Note (Addendum)
 She has remote history of Hodgkin lymphoma and breast cancer She was diagnosed with stage III primary peritoneal cancer in 2023 treated with neoadjuvant chemotherapy followed by surgery and completion of chemotherapy by March 2024 Pathology high-grade serous carcinoma, ER positive, p53 mutated, BRCA2 positive  She is doing well on maintenance olaparib  with minimal side effects We will continue treatment for minimum of 2 years, to be completed by April 2026 Reviewed CT imaging from April 2025 which showed no evidence of cancer recurrence She is reassured I will see her again in 2 months for further follow-up

## 2024-02-13 NOTE — Progress Notes (Signed)
 [facility] OFFICE PROGRESS NOTE  Patient Care Team: Azalia Leo, MD as PCP - General (Internal Medicine) Kyra Phy, MD as PCP - Cardiology (Cardiology) Tami Falcon, MD as Consulting Physician (Gastroenterology)  Assessment & Plan Primary peritoneal adenocarcinoma [facility]) She has remote history of Hodgkin lymphoma and breast cancer She was diagnosed with stage III primary peritoneal cancer in 2023 treated with neoadjuvant chemotherapy followed by surgery and completion of chemotherapy by March 2024 Pathology high-grade serous carcinoma, ER positive, p53 mutated, BRCA2 positive  She is doing well on maintenance olaparib  with minimal side effects We will continue treatment for minimum of 2 years, to be completed by April 2026 Reviewed CT imaging from April 2025 which showed no evidence of cancer recurrence She is reassured I will see her again in 2 months for further follow-up Poor dentition She had forgotten to withhold Lynparza  prior to dental extraction That was rescheduled to this week Reminded her to hold Lynparza  Abdominal wall hernia The palpable mass on her abdominal wall is due to her abdominal wall hernia We discussed importance of dietary modification and weight loss to reduce progression of her abdominal wall hernia  No orders of the defined types were placed in this encounter.    Almeda Jacobs, MD  INTERVAL HISTORY: she returns for surveillance follow-up and review of imaging study Her dental extraction was delayed due to the patient had forgotten to hold olaparib  prior to surgery She still have palpable abdominal hernia We spent majority of her time reviewing imaging study and discussed future plan  PHYSICAL EXAMINATION: ECOG PERFORMANCE STATUS: 0 - Asymptomatic  Vitals:   02/13/24 1240  BP: 126/80  Pulse: (!) 102  Resp: 17  Temp: 98.7 F (37.1 C)  SpO2: 99%   Filed Weights   02/13/24 1240  Weight: 172 lb 12.8 oz (78.4 kg)     Relevant data reviewed during this visit included CT imaging from April 2025

## 2024-02-13 NOTE — Assessment & Plan Note (Addendum)
 She had forgotten to withhold Lynparza  prior to dental extraction That was rescheduled to this week Reminded her to hold Lynparza 

## 2024-02-16 ENCOUNTER — Other Ambulatory Visit: Payer: Self-pay | Admitting: Hematology and Oncology

## 2024-02-16 ENCOUNTER — Encounter: Payer: Self-pay | Admitting: Gynecologic Oncology

## 2024-02-16 ENCOUNTER — Inpatient Hospital Stay (HOSPITAL_BASED_OUTPATIENT_CLINIC_OR_DEPARTMENT_OTHER): Payer: BC Managed Care – PPO | Admitting: Gynecologic Oncology

## 2024-02-16 VITALS — BP 133/80 | HR 89 | Temp 97.6°F | Resp 17 | Wt 172.2 lb

## 2024-02-16 DIAGNOSIS — C482 Malignant neoplasm of peritoneum, unspecified: Secondary | ICD-10-CM

## 2024-02-16 DIAGNOSIS — Z148 Genetic carrier of other disease: Secondary | ICD-10-CM | POA: Diagnosis not present

## 2024-02-16 DIAGNOSIS — Z1501 Genetic susceptibility to malignant neoplasm of breast: Secondary | ICD-10-CM | POA: Diagnosis not present

## 2024-02-16 DIAGNOSIS — K432 Incisional hernia without obstruction or gangrene: Secondary | ICD-10-CM

## 2024-02-16 DIAGNOSIS — Z79899 Other long term (current) drug therapy: Secondary | ICD-10-CM | POA: Diagnosis not present

## 2024-02-16 DIAGNOSIS — C786 Secondary malignant neoplasm of retroperitoneum and peritoneum: Secondary | ICD-10-CM

## 2024-02-16 DIAGNOSIS — Z1509 Genetic susceptibility to other malignant neoplasm: Secondary | ICD-10-CM | POA: Diagnosis not present

## 2024-02-16 DIAGNOSIS — Z8571 Personal history of Hodgkin lymphoma: Secondary | ICD-10-CM | POA: Diagnosis not present

## 2024-02-16 DIAGNOSIS — Z9221 Personal history of antineoplastic chemotherapy: Secondary | ICD-10-CM | POA: Diagnosis not present

## 2024-02-16 NOTE — Patient Instructions (Signed)
 It was good to see you today.  Dr. Marton Sleeper will help arrange repeat imaging when you see her in June.  Please call with any new or concerning symptoms before then.

## 2024-02-16 NOTE — Progress Notes (Signed)
 Lauren Chandler

## 2024-02-16 NOTE — Progress Notes (Signed)
 Gynecologic Oncology Return Clinic Visit  02/16/24  Reason for Visit: surveillance  Treatment History: Oncology History Overview Note  BRCA2 positive   Primary peritoneal adenocarcinoma (HCC)  07/19/2022 Imaging   1. Small left pleural effusion  2. 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HCC)   07/23/2022 Cancer Staging   Staging form: Ovary, Fallopian Tube, and Primary Peritoneal Carcinoma, AJCC 8th Edition - Clinical stage from 07/23/2022: FIGO Stage IIIC (cT3c, cN0, cM0) - Signed by Almeda Jacobs, MD on 07/23/2022 Stage prefix: Initial diagnosis   07/23/2022 Pathology Results   FINAL MICROSCOPIC DIAGNOSIS:  - Malignant cells present  - See comment   SPECIMEN ADEQUACY:  Satisfactory for evaluation   DIAGNOSTIC COMMENTS:  Immunohistochemical stains show that the tumor cells are positive for ER, PAX8, p16 and p53 (clonal overexpression pattern), consistent with a high-grade serous carcinoma.  Immunostain for WT1 is also diffusely positive in the tumor cells, most consistent with an ovarian primary.  Dr. LeGolvan reviewed the case and concurs with the above diagnosis.    07/30/2022 Procedure   Successful right chest port placement via the right internal jugular vein. The port is ready for immediate use.   08/02/2022 - 12/28/2022 Chemotherapy   Patient is on Treatment Plan : OVARIAN Carboplatin  (AUC 6) + Paclitaxel  (175) q21d X 6 Cycles     08/03/2022 Procedure   Successful ultrasound-guided paracentesis yielding 4.1 liters of peritoneal fluid.   08/09/2022 Tumor Marker   Patient's tumor was tested for the following markers: CA-125. Results of the tumor marker test revealed 452.   08/12/2022 Echocardiogram    1. Left ventricular ejection fraction, by estimation, is 40 to 45%. The left ventricle has mildly decreased function. The left ventricle demonstrates global hypokinesis. Left ventricular  diastolic parameters are indeterminate.   2. Right ventricular systolic function is normal. The right ventricular size is normal.   3. The mitral valve is normal in structure. Mild mitral valve regurgitation. No evidence of mitral stenosis.   4. The aortic valve is normal in structure. Aortic valve regurgitation is mild to moderate. Aortic valve sclerosis/calcification is present, without any evidence of aortic stenosis.   5. The inferior vena cava is normal in size with greater than 50% respiratory variability, suggesting right atrial pressure of 3 mmHg.    09/23/2022 Tumor Marker   Patient's tumor was tested for the following markers: CA-125. Results of the tumor marker test revealed 41.3.   10/07/2022 Imaging   1. Compared to the outside abdominopelvic CT of 07/19/2022, significant improvement in peritoneal carcinomatosis, without bowel obstruction or other acute complication. 2. Decreased size of upper abdominal nodes, possibly also representing response to therapy. 3. No new or progressive disease and no evidence of thoracic metastasis. 4. Bladder wall thickening and mucosal hyperenhancement are suspicious for cystitis. 5. 4 mm left upper lobe pulmonary nodule is most likely benign/incidental but can be re-evaluated at follow-up. 6. Aortic valvular calcifications. Consider echocardiography to evaluate for valvular dysfunction. 7. Coronary artery atherosclerosis. Aortic Atherosclerosis (ICD10-I70.0).     11/11/2022 Surgery   Exploratory laparotomy, lysis of adhesions for approximately 45 minutes, total omentectomy with radical tumor debulking including resection of perigastric nodule, excision and fulguration of multiple small bowel mesenteric nodules, resection of multiple sigmoid epiploica, fulguration of peritoneal lesions along bilateral pelvic sidewalls, peritoneal stripping of bladder peritoneum   Findings: On bimanual exam, no masses appreciated at the vaginal cuff, no nodularity.   On intra-abdominal entry,  infracolic omentum retracted and involved by residual tumor implant measuring approximately 10 x 4 cm.  Nodularity palpated almost up to the splenic flexure.  Supracolic omentum involved to within 2 cm of the greater curvature of the stomach.  Additional 2 x 3 cm implant noted lateral and inferior to the gastric pylorus (sitting anterior to the duodenum and just inferior to the gallbladder).  Ascending and transverse colon normal in appearance.  Small bowel run from the ileocecal valve to the ligament of Treitz.  Rare small nodules within the mesentery noted.  These were all either excised or fulgurated.  Right aspect of the liver and diaphragm smooth to palpation, no visible lesions.  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  No ascites.  Within the pelvis, minimal nodularity seen along bilateral pelvic sidewalls near the infundibulopelvic remnants, suspected to be related to prior hysterectomy and BSO.  Several sigmoid epiploica with tumor implants versus treated tumor, all excised fulgurated.  No nodularity within the deep pelvis although some evidence of what appeared to be treated tumor versus tumor rind involving the bladder peritoneum was stripped in this location.  Biopsies taken from the posterior cul-de-sac. R0 resection at the end of surgery.   11/11/2022 Pathology Results   A. SMALL BOWEL MESENTARY NODULES: -  Densely fibrotic soft tissue/scar with abundant foamy histiocytes, negative for malignancy.  B. 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  C. PERIGASTRIC IMPLANTS, EXCISION: -  High-grade serous carcinoma.  D. SIGMOID EPIPLOICA, EXCISION: High-grade serous carcinoma in the background of dense  stromal fibrosis/scar, calcifications and foamy histiocytes.  E. BLADDER PERITONEUM, EXCISION: -  High-grade serous carcinoma.  F. CUL DE SAC, POSTERIOR, BIOPSY: -  Focal high-grade serous carcinoma in the background of predominantly broad and foamy histiocytes.  Note: It is noted that the patient had a diagnosis of high-grade serous carcinoma from a cytology of the ascites in September 2023 and was clinically staged as cT3c.  Patient is now status post neoadjuvant chemotherapy.  It is noted that this is a debulking surgery.  No additional testing is performed to reserve tissue for clinician directed testing.    12/08/2022 Tumor Marker   Patient's tumor was tested for the following markers: CA-125. Results of the tumor marker test revealed 10.2.   02/03/2023 Imaging   1. Compared to 10/06/2022, interval omentectomy and peritoneal stripping. No residual omental thickening/nodularity. Scattered areas of mesenteric stranding may be postsurgical. 2. No new metastatic disease in the abdomen or pelvis. 3. Unchanged 5 mm enhancing focus along the anterior pancreatic body/tail compared to 07/19/2022. Differential includes intrapancreatic spleen or neoplasm, including small neuroendocrine tumor. Given the size of this lesion, evaluation by MRI would likely be suboptimal. 4. Aortic Atherosclerosis (ICD10-I70.0). Coronary artery calcifications. Assessment for potential risk factor modification, dietary therapy or pharmacologic therapy may be warranted, if clinically indicated.   02/03/2023 Tumor Marker   Patient's tumor was tested for the following markers: CA-125. Results of the tumor marker test revealed 7.8.   02/14/2023 -  Chemotherapy   She started taking olaparib    03/10/2023 Tumor Marker   Patient's tumor was tested for the following markers: CA-125. Results of the tumor marker test revealed 7.6.   04/08/2023 Tumor Marker   Patient's tumor was tested for the following markers:  CA-125. Results of the tumor marker test revealed  8.8.   06/08/2023 Tumor Marker   Patient's tumor was tested for the following markers: CA-125. Results of the tumor marker test revealed 9.4.   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 * Tracking Code: BO * EXAM: CT CHEST, ABDOMEN, AND PELVIS WITH CONTRAST TECHNIQUE: Multidetector CT imaging of the chest, abdomen and pelvis was performed following the standard protocol during bolus administration of intravenous contrast. RADIATION DOSE REDUCTION: This exam was performed according to the departmental dose-optimization program which includes automated exposure control, adjustment of the mA and/or kV according to patient size and/or use of iterative reconstruction technique. CONTRAST:  OMNIPAQUE  IOHEXOL  300 MG/ML  SOLN COMPARISON:  Multiple priors including CT October 06, 2022 and February 01, 2023. FINDINGS: CT CHEST FINDINGS Cardiovascular: Accessed right chest Port-A-Cath with tip near the superior cavoatrial junction. Aortic atherosclerosis. Normal size heart. No significant pericardial effusion/thickening. Calcifications of the aortic valve and annulus. Coronary artery calcifications. Mediastinum/Nodes: No suspicious thyroid  nodule. Calcified mediastinal lymph nodes. No pathologically enlarged mediastinal, hilar or axillary lymph nodes. Retained versus refluxed contrast in a patulous esophagus. Lungs/Pleura: Biapical pleuroparenchymal scarring. Scattered areas of scarring versus atelectasis. Stable 4 mm left upper lobe pulmonary nodule on image 35/4. No new suspicious pulmonary nodules or masses. Musculoskeletal: Bilateral breast prostheses. No aggressive lytic or blastic lesion of bone. Multilevel degenerative change of the spine. CT ABDOMEN PELVIS FINDINGS Hepatobiliary:  No suspicious hepatic lesion. Gallbladder is unremarkable. No biliary ductal dilation. Pancreas: No pancreatic ductal dilation or evidence of acute inflammation. Hyperenhancing focus along the anterior margin of the pancreas measuring 5 mm on image 53/2 is unchanged. Spleen: Prior splenectomy. Adrenals/Urinary Tract: Bilateral adrenal glands appear normal. No hydronephrosis. Kidneys demonstrate symmetric enhancement. Urinary bladder is unremarkable for degree of distension. Stomach/Bowel: Radiopaque enteric contrast material traverses distal loops of small bowel. Stomach is unremarkable for degree of distension. No pathologic dilation of small or large bowel. Colonic diverticulosis without findings of acute diverticulitis. Vascular/Lymphatic: Aortic atherosclerosis. Normal caliber abdominal aorta. Smooth IVC contours. The portal, splenic and superior mesenteric veins are patent. No pathologically enlarged abdominal or pelvic lymph nodes. Reproductive: Prior hysterectomy and salpingo oophorectomy without suspicious nodularity along the vaginal cuff or in the adnexa. Other: Prior omentectomy and peritoneal stripping. Similar minimal mesenteric/peritoneal stranding. Similar pelvic peritoneal thickening for instance on image 100/2. No free fluid. No new discrete peritoneal or omental nodularity. Postsurgical change in the anterior abdominal wall. Small fat containing peri-incisional hernia. Musculoskeletal: No aggressive lytic or blastic lesion of bone. IMPRESSION: 1. Prior hysterectomy and salpingo-oophorectomy without evidence of local recurrence. 2. Prior omentectomy and peritoneal stripping with similar minimal mesenteric/peritoneal stranding and pelvic peritoneal thickening, likely postsurgical. No new discrete peritoneal or omental nodularity. 3. Stable 4 mm left upper lobe pulmonary nodule. No new suspicious pulmonary nodules or masses. 4. No evidence of new or progressive disease in the chest, abdomen or pelvis.  5. Stable 5 mm hyperenhancing focus along the anterior margin of the pancreas, differential includes intrapancreatic spleen versus neoplasm including small neuroendocrine tumor. Given small size MRI would likely be of limited utility. Suggest continued attention on follow-up imaging. 6.  Aortic Atherosclerosis (ICD10-I70.0). Electronically Signed   By: Tama Fails M.D.   On: 08/02/2023 14:56      08/03/2023 Tumor Marker   Patient's tumor was tested for the following markers: CA-125. Results of the tumor marker  test revealed 8.   09/28/2023 Tumor Marker   Patient's tumor was tested for the following markers: CA-125. Results of the tumor marker test revealed 7.9.   11/23/2023 Tumor Marker   Patient's tumor was tested for the following markers: CA-125. Results of the tumor marker test revealed 8.5.   01/24/2024 Tumor Marker   Patient's tumor was tested for the following markers: CA-125. Results of the tumor marker test revealed 27.6.   02/03/2024 Imaging   1. No acute abdominal or pelvic pathology identified. 2. Diverticulosis. 3. No evidence of recurrent or metastatic disease. 4. Aortic atherosclerosis.      Interval History: Doing well.  Denies any abdominal or pelvic pain.  Reports baseline bowel bladder function.  Endorses good appetite as well as energy level.  Had some recent oral surgery, stopped her PARP inhibitor for about 5 days around the time of surgery.  Aware of her hernia, denies any symptoms including pain.  Past Medical/Surgical History: Past Medical History:  Diagnosis Date   Anemia    BRCA2 gene mutation positive 01/08/2019   Breast cancer (HCC) 2007   Left   GERD (gastroesophageal reflux disease)    Hodgkin's disease (HCC) 10/26/1987   clinical   Hypothyroidism    Malignant tumor of breast (HCC)    left   Pneumonia    Thyroid  dysfunction    2020, as results on Hodgkin's radiation    Past Surgical History:  Procedure Laterality Date   BREAST  RECONSTRUCTION     2009   COLONOSCOPY     2017   DEBULKING N/A 11/11/2022   Procedure: TUMOR DEBULKING;  Surgeon: Suzi Essex, MD;  Location: [facility];  Service: Gynecology;  Laterality: N/A;   excisional of bilateral breast     2007   Hysteroscopy w.biopsy     2012   IR IMAGING GUIDED PORT INSERTION  07/29/2022   IR PARACENTESIS  07/23/2022   KNEE ARTHROSCOPY     1985   laparoscopically assisted vaginal hysterectomy w/removal of tubes and/or ovaries  04/23/2019   LYMPH NODE BIOPSY  1989   MASTECTOMY Bilateral 2007   OMENTECTOMY N/A 11/11/2022   Procedure: OPEN OMENTECTOMY, PERITONEAL STRIPPING,  LYSIS OF ADHESIONS;  Surgeon: Suzi Essex, MD;  Location: [facility];  Service: Gynecology;  Laterality: N/A;   removal of spleen total     1989   RIGHT/LEFT HEART CATH AND CORONARY ANGIOGRAPHY N/A 10/28/2022   Procedure: RIGHT/LEFT HEART CATH AND CORONARY ANGIOGRAPHY;  Surgeon: Kyra Phy, MD;  Location: [facility];  Service: Cardiovascular;  Laterality: N/A;    Family History  Problem Relation Age of Onset   Prostate cancer Father    Prostate cancer Brother    Colon cancer Maternal Grandmother    Breast cancer Paternal Grandmother    Ovarian cancer Neg Hx    Endometrial cancer Neg Hx    Pancreatic cancer Neg Hx     Social History   Socioeconomic History   Marital status: Married    Spouse name: Not on file   Number of children: Not on file   Years of education: Not on file   Highest education level: Not on file  Occupational History   Occupation: retired  Tobacco Use   Smoking status: Never   Smokeless tobacco: Never  Vaping Use   Vaping status: Never Used  Substance and Sexual Activity   Alcohol  use: Yes    Comment: rare   Drug use: Never   Sexual activity:  Yes  Other Topics Concern   Not on file  Social History Narrative   Not on file   Social Drivers of Health   Financial Resource Strain: Low Risk  (08/02/2022)   Overall Financial Resource  Strain (CARDIA)    Difficulty of Paying Living Expenses: Not very hard  Food Insecurity: No Food Insecurity (11/11/2022)   Hunger Vital Sign    Worried About Running Out of Food in the Last Year: Never true    Ran Out of Food in the Last Year: Never true  Transportation Needs: No Transportation Needs (11/11/2022)   PRAPARE - Administrator, Civil Service (Medical): No    Lack of Transportation (Non-Medical): No  Physical Activity: Not on file  Stress: Not on file  Social Connections: Unknown (07/19/2022)   Received from [facility]   Social Network    Social Network: Not on file    Current Medications:  Current Outpatient Medications:    aspirin  81 MG chewable tablet, Chew 81 mg by mouth daily., Disp: , Rfl:    Cholecalciferol (VITAMIN D -3) 125 MCG (5000 UT) TABS, Take by mouth., Disp: , Rfl:    levothyroxine  (SYNTHROID ) 125 MCG tablet, Take 125 mcg by mouth daily before breakfast., Disp: , Rfl:    lidocaine -prilocaine  (EMLA ) cream, Apply 1 Application topically as needed., Disp: 30 g, Rfl: 3   olaparib  (LYNPARZA ) 100 MG tablet, TAKE 2 TABLETS (200 MG TOTAL) TWICE A DAY. SWALLOW WHOLE. MAY TAKE WITH FOOD TO DECREASE NAUSEA AND VOMITING., Disp: 120 tablet, Rfl: 9   pantoprazole  (PROTONIX ) 40 MG tablet, Take 40 mg by mouth daily before breakfast., Disp: , Rfl:    rosuvastatin  (CRESTOR ) 5 MG tablet, Take 1 tablet (5 mg total) by mouth daily., Disp: 90 tablet, Rfl: 3   vitamin B-12 (CYANOCOBALAMIN ) 500 MCG tablet, Take 500 mcg by mouth every evening., Disp: , Rfl:   Review of Systems: Denies appetite changes, fevers, chills, fatigue, unexplained weight changes. Denies hearing loss, neck lumps or masses, mouth sores, ringing in ears or voice changes. Denies cough or wheezing.  Denies shortness of breath. Denies chest pain or palpitations. Denies leg swelling. Denies abdominal distention, pain, blood in stools, constipation, diarrhea, nausea, vomiting, or early  satiety. Denies pain with intercourse, dysuria, frequency, hematuria or incontinence. Denies hot flashes, pelvic pain, vaginal bleeding or vaginal discharge.   Denies joint pain, back pain or muscle pain/cramps. Denies itching, rash, or wounds. Denies dizziness, headaches, numbness or seizures. Denies swollen lymph nodes or glands, denies easy bruising or bleeding. Denies anxiety, depression, confusion, or decreased concentration.  Physical Exam: BP 133/80 (BP Location: Left Arm, Patient Position: Sitting)   Pulse 89   Temp 97.6 F (36.4 C) (Oral)   Resp 17   Wt 172 lb 3.2 oz (78.1 kg)   SpO2 100%   BMI 31.50 kg/m  General: Alert, oriented, no acute distress. HEENT: Normocephalic, atraumatic, sclera anicteric. Chest: Clear to auscultation bilaterally.  No wheezes or rhonchi. Cardiovascular: Regular rate and rhythm, no murmurs. Abdomen: soft, nontender.  Normoactive bowel sounds.  No masses or hepatosplenomegaly appreciated.  3-4 cm bulge with Valsalva along the right lateral portion of the incision at the level of the umbilicus, nontender. Extremities: Grossly normal range of motion.  Warm, well perfused.  No edema bilaterally. Skin: No rashes or lesions noted. Lymphatics: No cervical, supraclavicular, or inguinal adenopathy. GU: Normal appearing external genitalia without erythema, excoriation, or lesions.  Speculum exam reveals moderately atrophic vaginal mucosa, no  lesions.  Bimanual exam reveals cuff intact, no masses or nodularity.    Laboratory & Radiologic Studies:          Component Ref Range & Units (hover) 4 wk ago 2 mo ago 4 mo ago 6 mo ago 8 mo ago 10 mo ago 11 mo ago  Cancer Antigen (CA) 125 27.6 8.5 CM 7.9 CM 8.0 CM 9.4 CM 8.8 CM 7.6 CM      Assessment & Plan: Lauren Chandler is a 58 y.o. woman with a history of Stage IIIC primary peritoneal high grade serous carcinoma status post 4 cycles of neoadjuvant chemotherapy followed by interval debulking surgery (10/2022)  and adjuvant chemotherapy. Now on Lynparza  for maintenance in the setting of BRCA2 mutation.   The patient is doing very well.  Tolerating Lynparza  without side effects.  Her most recent CA125 had increased.  Imaging was performed earlier this month.  On my review, there are at least several mesenteric nodules or thickening along the peritoneum.  I called and reviewed images with radiology again with agreement that this likely represents early peritoneal recurrence.  Discussed with the patient options of coming off of PARP inhibitor and restarting chemotherapy is continuing on the PARP inhibitor with short interval repeat imaging.  Given how well she is doing, fact that she is asymptomatic, and difficulty tolerating chemotherapy initially, I would favor the latter.  After our conversation, patient is in agreement with this.  She is scheduled to see Dr. Marton Sleeper in June, plan will be for repeat imaging at that time.  Hernia continues to be evident on exam as well as imaging.  She is basically asymptomatic.  Discussed reasons that should prompt a phone call or presenting to the emergency department for evaluation.   I will see her back for follow-up in 4 months.  22 minutes of total time was spent for this patient encounter, including preparation, face-to-face counseling with the patient and coordination of care, and documentation of the encounter.  Wiley Hanger, MD  Division of Gynecologic Oncology  Department of Obstetrics and Gynecology  [facility]

## 2024-02-21 ENCOUNTER — Other Ambulatory Visit: Payer: Self-pay | Admitting: Hematology and Oncology

## 2024-02-21 DIAGNOSIS — C482 Malignant neoplasm of peritoneum, unspecified: Secondary | ICD-10-CM

## 2024-03-01 ENCOUNTER — Other Ambulatory Visit

## 2024-03-13 ENCOUNTER — Ambulatory Visit: Admitting: Hematology and Oncology

## 2024-03-27 ENCOUNTER — Inpatient Hospital Stay: Attending: Gynecologic Oncology

## 2024-03-27 ENCOUNTER — Ambulatory Visit: Admitting: Hematology and Oncology

## 2024-03-27 ENCOUNTER — Telehealth: Payer: Self-pay

## 2024-03-27 ENCOUNTER — Other Ambulatory Visit

## 2024-03-27 ENCOUNTER — Ambulatory Visit (HOSPITAL_COMMUNITY)
Admission: RE | Admit: 2024-03-27 | Discharge: 2024-03-27 | Disposition: A | Discharge status: Home / Self Care | Source: Ambulatory Visit | Attending: Hematology and Oncology | Admitting: Hematology and Oncology

## 2024-03-27 DIAGNOSIS — R180 Malignant ascites: Secondary | ICD-10-CM | POA: Insufficient documentation

## 2024-03-27 DIAGNOSIS — J984 Other disorders of lung: Secondary | ICD-10-CM | POA: Insufficient documentation

## 2024-03-27 DIAGNOSIS — R188 Other ascites: Secondary | ICD-10-CM | POA: Diagnosis not present

## 2024-03-27 DIAGNOSIS — Z5112 Encounter for antineoplastic immunotherapy: Secondary | ICD-10-CM | POA: Insufficient documentation

## 2024-03-27 DIAGNOSIS — K432 Incisional hernia without obstruction or gangrene: Secondary | ICD-10-CM | POA: Insufficient documentation

## 2024-03-27 DIAGNOSIS — Z8543 Personal history of malignant neoplasm of ovary: Secondary | ICD-10-CM | POA: Diagnosis not present

## 2024-03-27 DIAGNOSIS — C482 Malignant neoplasm of peritoneum, unspecified: Secondary | ICD-10-CM | POA: Insufficient documentation

## 2024-03-27 DIAGNOSIS — I700 Atherosclerosis of aorta: Secondary | ICD-10-CM | POA: Insufficient documentation

## 2024-03-27 DIAGNOSIS — N3289 Other specified disorders of bladder: Secondary | ICD-10-CM | POA: Insufficient documentation

## 2024-03-27 DIAGNOSIS — I080 Rheumatic disorders of both mitral and aortic valves: Secondary | ICD-10-CM | POA: Insufficient documentation

## 2024-03-27 DIAGNOSIS — Z5111 Encounter for antineoplastic chemotherapy: Secondary | ICD-10-CM | POA: Insufficient documentation

## 2024-03-27 DIAGNOSIS — Z9081 Acquired absence of spleen: Secondary | ICD-10-CM | POA: Diagnosis not present

## 2024-03-27 DIAGNOSIS — I2510 Atherosclerotic heart disease of native coronary artery without angina pectoris: Secondary | ICD-10-CM | POA: Insufficient documentation

## 2024-03-27 DIAGNOSIS — K5730 Diverticulosis of large intestine without perforation or abscess without bleeding: Secondary | ICD-10-CM | POA: Insufficient documentation

## 2024-03-27 DIAGNOSIS — Z79899 Other long term (current) drug therapy: Secondary | ICD-10-CM | POA: Insufficient documentation

## 2024-03-27 LAB — CBC WITH DIFFERENTIAL/PLATELET
Abs Immature Granulocytes: 0.05 10*3/uL (ref 0.00–0.07)
Basophils Absolute: 0.1 10*3/uL (ref 0.0–0.1)
Basophils Relative: 1 %
Eosinophils Absolute: 0.3 10*3/uL (ref 0.0–0.5)
Eosinophils Relative: 3 %
HCT: 31 % — ABNORMAL LOW (ref 36.0–46.0)
Hemoglobin: 10.8 g/dL — ABNORMAL LOW (ref 12.0–15.0)
Immature Granulocytes: 1 %
Lymphocytes Relative: 17 %
Lymphs Abs: 1.7 10*3/uL (ref 0.7–4.0)
MCH: 34.5 pg — ABNORMAL HIGH (ref 26.0–34.0)
MCHC: 34.8 g/dL (ref 30.0–36.0)
MCV: 99 fL (ref 80.0–100.0)
Monocytes Absolute: 0.9 10*3/uL (ref 0.1–1.0)
Monocytes Relative: 9 %
Neutro Abs: 7.3 10*3/uL (ref 1.7–7.7)
Neutrophils Relative %: 69 %
Platelets: 485 10*3/uL — ABNORMAL HIGH (ref 150–400)
RBC: 3.13 MIL/uL — ABNORMAL LOW (ref 3.87–5.11)
RDW: 17 % — ABNORMAL HIGH (ref 11.5–15.5)
WBC: 10.3 10*3/uL (ref 4.0–10.5)
nRBC: 10.2 % — ABNORMAL HIGH (ref 0.0–0.2)

## 2024-03-27 LAB — COMPREHENSIVE METABOLIC PANEL WITH GFR
ALT: 10 U/L (ref 0–44)
AST: 18 U/L (ref 15–41)
Albumin: 3.7 g/dL (ref 3.5–5.0)
Alkaline Phosphatase: 65 U/L (ref 38–126)
Anion gap: 7 (ref 5–15)
BUN: 19 mg/dL (ref 6–20)
CO2: 29 mmol/L (ref 22–32)
Calcium: 9.2 mg/dL (ref 8.9–10.3)
Chloride: 103 mmol/L (ref 98–111)
Creatinine, Ser: 1.26 mg/dL — ABNORMAL HIGH (ref 0.44–1.00)
GFR, Estimated: 49 mL/min — ABNORMAL LOW (ref >60)
Glucose, Bld: 114 mg/dL — ABNORMAL HIGH (ref 70–99)
Potassium: 4 mmol/L (ref 3.5–5.1)
Sodium: 139 mmol/L (ref 135–145)
Total Bilirubin: 0.4 mg/dL (ref 0.0–1.2)
Total Protein: 7.4 g/dL (ref 6.5–8.1)

## 2024-03-27 MED ORDER — HEPARIN SOD (PORK) LOCK FLUSH 100 UNIT/ML IV SOLN
500.0000 [IU] | Freq: Once | INTRAVENOUS | Status: AC
  Administered 2024-03-27: 500 [IU] via INTRAVENOUS

## 2024-03-27 MED ORDER — IOHEXOL 300 MG/ML  SOLN
100.0000 mL | Freq: Once | INTRAMUSCULAR | Status: AC | PRN
  Administered 2024-03-27: 100 mL via INTRAVENOUS

## 2024-03-27 MED ORDER — SODIUM CHLORIDE 0.9% FLUSH
10.0000 mL | Freq: Once | INTRAVENOUS | Status: AC
  Administered 2024-03-27: 10 mL

## 2024-03-27 MED ORDER — HEPARIN SOD (PORK) LOCK FLUSH 100 UNIT/ML IV SOLN
INTRAVENOUS | Status: AC
  Filled 2024-03-27: qty 5

## 2024-03-27 MED ORDER — SODIUM CHLORIDE (PF) 0.9 % IJ SOLN
INTRAMUSCULAR | Status: AC
Start: 2024-03-27 — End: ?
  Filled 2024-03-27: qty 50

## 2024-03-27 NOTE — Telephone Encounter (Signed)
 Called and scheduled appt on 6/5 at 8 am to review CT results. She took appt but was planning to go out of town tomorrow. She will call the office back if she needs to cancel appt.

## 2024-03-28 LAB — CA 125: Cancer Antigen (CA) 125: 355 U/mL — ABNORMAL HIGH (ref 0.0–38.1)

## 2024-03-29 ENCOUNTER — Inpatient Hospital Stay (HOSPITAL_BASED_OUTPATIENT_CLINIC_OR_DEPARTMENT_OTHER): Admitting: Hematology and Oncology

## 2024-03-29 ENCOUNTER — Encounter: Payer: Self-pay | Admitting: Oncology

## 2024-03-29 ENCOUNTER — Encounter: Payer: Self-pay | Admitting: Hematology and Oncology

## 2024-03-29 ENCOUNTER — Ambulatory Visit: Admitting: Hematology and Oncology

## 2024-03-29 DIAGNOSIS — I080 Rheumatic disorders of both mitral and aortic valves: Secondary | ICD-10-CM | POA: Diagnosis not present

## 2024-03-29 DIAGNOSIS — K5730 Diverticulosis of large intestine without perforation or abscess without bleeding: Secondary | ICD-10-CM | POA: Diagnosis not present

## 2024-03-29 DIAGNOSIS — C482 Malignant neoplasm of peritoneum, unspecified: Secondary | ICD-10-CM | POA: Diagnosis not present

## 2024-03-29 DIAGNOSIS — Z9081 Acquired absence of spleen: Secondary | ICD-10-CM | POA: Diagnosis not present

## 2024-03-29 DIAGNOSIS — R180 Malignant ascites: Secondary | ICD-10-CM | POA: Diagnosis not present

## 2024-03-29 DIAGNOSIS — Z79899 Other long term (current) drug therapy: Secondary | ICD-10-CM | POA: Diagnosis not present

## 2024-03-29 DIAGNOSIS — Z5111 Encounter for antineoplastic chemotherapy: Secondary | ICD-10-CM | POA: Diagnosis not present

## 2024-03-29 DIAGNOSIS — K432 Incisional hernia without obstruction or gangrene: Secondary | ICD-10-CM | POA: Diagnosis not present

## 2024-03-29 DIAGNOSIS — I2510 Atherosclerotic heart disease of native coronary artery without angina pectoris: Secondary | ICD-10-CM | POA: Diagnosis not present

## 2024-03-29 DIAGNOSIS — J984 Other disorders of lung: Secondary | ICD-10-CM | POA: Diagnosis not present

## 2024-03-29 DIAGNOSIS — N3289 Other specified disorders of bladder: Secondary | ICD-10-CM | POA: Diagnosis not present

## 2024-03-29 DIAGNOSIS — I700 Atherosclerosis of aorta: Secondary | ICD-10-CM | POA: Diagnosis not present

## 2024-03-29 DIAGNOSIS — Z5112 Encounter for antineoplastic immunotherapy: Secondary | ICD-10-CM | POA: Diagnosis not present

## 2024-03-29 NOTE — Progress Notes (Signed)
 HEMATOLOGY-ONCOLOGY ELECTRONIC VISIT PROGRESS NOTE  Patient Care Team: Jhon Moselle, FNP as PCP - General (Internal Medicine) Thukkani, Arun K, MD as PCP - Cardiology (Cardiology) Tami Falcon, MD as Consulting Physician (Gastroenterology)  I connected with the patient via telephone conference and verified that I am speaking with the correct person using two identifiers. The patient's location is at home and I am providing care from the [facility] I discussed the limitations, risks, security and privacy concerns of performing an evaluation and management service by e-visits and the availability of in person appointments.  I also discussed with the patient that there may be a patient responsible charge related to this service. The patient expressed understanding and agreed to proceed.   ASSESSMENT & PLAN:  Primary peritoneal adenocarcinoma (HCC) She has remote history of Hodgkin lymphoma and breast cancer She was diagnosed with stage III primary peritoneal cancer in 2023 treated with neoadjuvant chemotherapy followed by surgery and completion of chemotherapy by March 2024 Pathology high-grade serous carcinoma, ER positive, p53 mutated, BRCA2 positive  She is doing well on maintenance olaparib  with minimal side effects Unfortunately, recent imaging study and tumor marker showed disease progression/recurrence She is mildly symptomatic We will discontinue Olaparib  I explained to the patient why surgery is not indicated We discussed the role of chemotherapy We discussed risk, benefits, side effects of combination chemotherapy with carboplatin , paclitaxel  and bevacizumab versus carboplatin , gemcitabine with bevacizumab I will order molecular testing on her tissue sample for future molecular targets but the frontline of treatment for recurrent disease would be combination chemotherapy at this point At the end of our discussion, the patient is undecided She will travel back home this  weekend and will let me know her final decision on Monday I will inform pharmacist that we will discontinue her current prescription With recurrent disease, I also told the patient that she is not considered curable and would likely need some form of maintenance treatment long-term  No orders of the defined types were placed in this encounter.   INTERVAL HISTORY: Please see below for problem oriented charting. The purpose of today's discussion is to review test results The patient is not able to come in in person as she is out of town She has been complaining of mild intermittent sensation of bloating but denies nausea or abdominal pain I reviewed recent blood work and imaging studies We discussed risk and benefits of second line chemotherapy, options discussed including combination of carboplatin , paclitaxel  with bevacizumab versus carboplatin , gemcitabine with bevacizumab I explained to the patient why we discontinue her PARP inhibitor and why surgery at this point is not indicated  SUMMARY OF ONCOLOGIC HISTORY: Oncology History Overview Note  BRCA2 positive   Primary peritoneal adenocarcinoma (HCC)  07/19/2022 Imaging   1. Small left pleural effusion  2. 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HCC)   07/23/2022 Cancer Staging   Staging form: Ovary, Fallopian Tube, and Primary Peritoneal Carcinoma, AJCC 8th Edition - Clinical stage from 07/23/2022: FIGO Stage IIIC (cT3c, cN0, cM0) - Signed by Almeda Jacobs, MD on 07/23/2022 Stage prefix: Initial diagnosis   07/23/2022 Pathology Results   FINAL MICROSCOPIC DIAGNOSIS:  - Malignant cells present  - See comment   SPECIMEN ADEQUACY:  Satisfactory for evaluation   DIAGNOSTIC COMMENTS:  Immunohistochemical stains show that the tumor cells are positive for ER, PAX8, p16 and p53 (clonal overexpression pattern), consistent with a high-grade serous  carcinoma.  Immunostain for WT1 is also diffusely positive in the tumor cells, most consistent with an ovarian primary.  Dr. LeGolvan reviewed the case and concurs with the above diagnosis.    07/30/2022 Procedure   Successful right chest port placement via the right internal jugular vein. The port is ready for immediate use.   08/02/2022 - 12/28/2022 Chemotherapy   Patient is on Treatment Plan : OVARIAN Carboplatin  (AUC 6) + Paclitaxel  (175) q21d X 6 Cycles     08/03/2022 Procedure   Successful ultrasound-guided paracentesis yielding 4.1 liters of peritoneal fluid.   08/09/2022 Tumor Marker   Patient's tumor was tested for the following markers: CA-125. Results of the tumor marker test revealed 452.   08/12/2022 Echocardiogram    1. Left ventricular ejection fraction, by estimation, is 40 to 45%. The left ventricle has mildly decreased function. The left ventricle demonstrates global hypokinesis. Left ventricular diastolic parameters are indeterminate.   2. Right ventricular systolic function is normal. The right ventricular size is normal.   3. The mitral valve is normal in structure. Mild mitral valve regurgitation. No evidence of mitral stenosis.   4. The aortic valve is normal in structure. Aortic valve regurgitation is mild to moderate. Aortic valve sclerosis/calcification is present, without any evidence of aortic stenosis.   5. The inferior vena cava is normal in size with greater than 50% respiratory variability, suggesting right atrial pressure of 3 mmHg.    09/23/2022 Tumor Marker   Patient's tumor was tested for the following markers: CA-125. Results of the tumor marker test revealed 41.3.   10/07/2022 Imaging   1. Compared to the outside abdominopelvic CT of 07/19/2022, significant improvement in peritoneal carcinomatosis, without bowel obstruction or other acute complication. 2. Decreased size of upper abdominal nodes, possibly also representing response to therapy. 3. No new  or progressive disease and no evidence of thoracic metastasis. 4. Bladder wall thickening and mucosal hyperenhancement are suspicious for cystitis. 5. 4 mm left upper lobe pulmonary nodule is most likely benign/incidental but can be re-evaluated at follow-up. 6. Aortic valvular calcifications. Consider echocardiography to evaluate for valvular dysfunction. 7. Coronary artery atherosclerosis. Aortic Atherosclerosis (ICD10-I70.0).     11/11/2022 Surgery   Exploratory laparotomy, lysis of adhesions for approximately 45 minutes, total omentectomy with radical tumor debulking including resection of perigastric nodule, excision and fulguration of multiple small bowel mesenteric nodules, resection of multiple sigmoid epiploica, fulguration of peritoneal lesions along bilateral pelvic sidewalls, peritoneal stripping of bladder peritoneum   Findings: On bimanual exam, no masses appreciated at the vaginal cuff, no nodularity.  On intra-abdominal entry, infracolic omentum retracted and involved by residual tumor implant measuring approximately 10 x 4 cm.  Nodularity palpated almost up to the splenic flexure.  Supracolic omentum involved to within 2 cm of the greater curvature of the stomach.  Additional 2 x 3 cm implant noted lateral and inferior to the gastric pylorus (sitting anterior to the duodenum and just inferior to the gallbladder).  Ascending and transverse colon normal in appearance.  Small bowel run from the ileocecal valve to the ligament of Treitz.  Rare small nodules within the mesentery noted.  These were all either excised or fulgurated.  Right aspect of the liver and diaphragm smooth to palpation, no visible lesions.  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  No ascites.  Within the pelvis, minimal nodularity seen along bilateral pelvic sidewalls near the infundibulopelvic remnants, suspected to be related to prior hysterectomy and  BSO.   Several sigmoid epiploica with tumor implants versus treated tumor, all excised fulgurated.  No nodularity within the deep pelvis although some evidence of what appeared to be treated tumor versus tumor rind involving the bladder peritoneum was stripped in this location.  Biopsies taken from the posterior cul-de-sac. R0 resection at the end of surgery.   11/11/2022 Pathology Results   A. SMALL BOWEL MESENTARY NODULES: -  Densely fibrotic soft tissue/scar with abundant foamy histiocytes, negative for malignancy.  B. 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  C. PERIGASTRIC IMPLANTS, EXCISION: -  High-grade serous carcinoma.  D. SIGMOID EPIPLOICA, EXCISION: High-grade serous carcinoma in the background of dense stromal fibrosis/scar, calcifications and foamy histiocytes.  E. BLADDER PERITONEUM, EXCISION: -  High-grade serous carcinoma.  F. CUL DE SAC, POSTERIOR, BIOPSY: -  Focal high-grade serous carcinoma in the background of predominantly broad and foamy histiocytes.  Note: It is noted that the patient had a diagnosis of high-grade serous carcinoma from a cytology of the ascites in September 2023 and was clinically staged as cT3c.  Patient is now status post neoadjuvant chemotherapy.  It is noted that this is a debulking surgery.  No additional testing is performed to reserve tissue for clinician directed testing.    12/08/2022 Tumor Marker   Patient's tumor was tested for the following markers: CA-125. Results of the tumor marker test revealed 10.2.   02/03/2023 Imaging   1. Compared to 10/06/2022, interval omentectomy and peritoneal stripping. No residual omental thickening/nodularity. Scattered areas of mesenteric stranding may be postsurgical. 2. No new metastatic disease in the abdomen or  pelvis. 3. Unchanged 5 mm enhancing focus along the anterior pancreatic body/tail compared to 07/19/2022. Differential includes intrapancreatic spleen or neoplasm, including small neuroendocrine tumor. Given the size of this lesion, evaluation by MRI would likely be suboptimal. 4. Aortic Atherosclerosis (ICD10-I70.0). Coronary artery calcifications. Assessment for potential risk factor modification, dietary therapy or pharmacologic therapy may be warranted, if clinically indicated.   02/03/2023 Tumor Marker   Patient's tumor was tested for the following markers: CA-125. Results of the tumor marker test revealed 7.8.   02/14/2023 -  Chemotherapy   She started taking olaparib    03/10/2023 Tumor Marker   Patient's tumor was tested for the following markers: CA-125. Results of the tumor marker test revealed 7.6.   04/08/2023 Tumor Marker   Patient's tumor was tested for the following markers: CA-125. Results of the tumor marker test revealed 8.8.   06/08/2023 Tumor Marker   Patient's tumor was tested for the following markers: CA-125. Results of the tumor marker test revealed 9.4.   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 * Tracking Code: BO * EXAM: CT CHEST, ABDOMEN, AND PELVIS WITH CONTRAST TECHNIQUE: Multidetector CT imaging of the chest, abdomen and pelvis was performed following the standard protocol during bolus administration of intravenous contrast. RADIATION DOSE REDUCTION: This exam was performed according to the departmental dose-optimization program which includes automated exposure control, adjustment of the mA and/or kV according to patient size and/or use of iterative reconstruction technique. CONTRAST:  OMNIPAQUE  IOHEXOL  300 MG/ML  SOLN COMPARISON:  Multiple priors including CT October 06, 2022  and February 01, 2023. FINDINGS: CT CHEST FINDINGS Cardiovascular: Accessed right  chest Port-A-Cath with tip near the superior cavoatrial junction. Aortic atherosclerosis. Normal size heart. No significant pericardial effusion/thickening. Calcifications of the aortic valve and annulus. Coronary artery calcifications. Mediastinum/Nodes: No suspicious thyroid  nodule. Calcified mediastinal lymph nodes. No pathologically enlarged mediastinal, hilar or axillary lymph nodes. Retained versus refluxed contrast in a patulous esophagus. Lungs/Pleura: Biapical pleuroparenchymal scarring. Scattered areas of scarring versus atelectasis. Stable 4 mm left upper lobe pulmonary nodule on image 35/4. No new suspicious pulmonary nodules or masses. Musculoskeletal: Bilateral breast prostheses. No aggressive lytic or blastic lesion of bone. Multilevel degenerative change of the spine. CT ABDOMEN PELVIS FINDINGS Hepatobiliary: No suspicious hepatic lesion. Gallbladder is unremarkable. No biliary ductal dilation. Pancreas: No pancreatic ductal dilation or evidence of acute inflammation. Hyperenhancing focus along the anterior margin of the pancreas measuring 5 mm on image 53/2 is unchanged. Spleen: Prior splenectomy. Adrenals/Urinary Tract: Bilateral adrenal glands appear normal. No hydronephrosis. Kidneys demonstrate symmetric enhancement. Urinary bladder is unremarkable for degree of distension. Stomach/Bowel: Radiopaque enteric contrast material traverses distal loops of small bowel. Stomach is unremarkable for degree of distension. No pathologic dilation of small or large bowel. Colonic diverticulosis without findings of acute diverticulitis. Vascular/Lymphatic: Aortic atherosclerosis. Normal caliber abdominal aorta. Smooth IVC contours. The portal, splenic and superior mesenteric veins are patent. No pathologically enlarged abdominal or pelvic lymph nodes. Reproductive: Prior hysterectomy and salpingo oophorectomy without suspicious  nodularity along the vaginal cuff or in the adnexa. Other: Prior omentectomy and peritoneal stripping. Similar minimal mesenteric/peritoneal stranding. Similar pelvic peritoneal thickening for instance on image 100/2. No free fluid. No new discrete peritoneal or omental nodularity. Postsurgical change in the anterior abdominal wall. Small fat containing peri-incisional hernia. Musculoskeletal: No aggressive lytic or blastic lesion of bone. IMPRESSION: 1. Prior hysterectomy and salpingo-oophorectomy without evidence of local recurrence. 2. Prior omentectomy and peritoneal stripping with similar minimal mesenteric/peritoneal stranding and pelvic peritoneal thickening, likely postsurgical. No new discrete peritoneal or omental nodularity. 3. Stable 4 mm left upper lobe pulmonary nodule. No new suspicious pulmonary nodules or masses. 4. No evidence of new or progressive disease in the chest, abdomen or pelvis. 5. Stable 5 mm hyperenhancing focus along the anterior margin of the pancreas, differential includes intrapancreatic spleen versus neoplasm including small neuroendocrine tumor. Given small size MRI would likely be of limited utility. Suggest continued attention on follow-up imaging. 6.  Aortic Atherosclerosis (ICD10-I70.0). Electronically Signed   By: Tama Fails M.D.   On: 08/02/2023 14:56      08/03/2023 Tumor Marker   Patient's tumor was tested for the following markers: CA-125. Results of the tumor marker test revealed 8.   09/28/2023 Tumor Marker   Patient's tumor was tested for the following markers: CA-125. Results of the tumor marker test revealed 7.9.   11/23/2023 Tumor Marker   Patient's tumor was tested for the following markers: CA-125. Results of the tumor marker test revealed 8.5.   01/24/2024 Tumor Marker   Patient's tumor was tested for the following markers: CA-125. Results of the tumor marker test revealed 27.6.   01/31/2024 Imaging   Received phone call to discuss imaging  results with Dr. Wiley Hanger on 02/16/2024 at 2:05 pm. Exam dated 01/31/2024 was compared with available prior imaging dated 08/02/2023. There is interval development of new peritoneal thickening and nodularity. For example, diffuse peritoneal thickening is seen within the lower abdomen/pelvis (2:69) and multiple peritoneal nodules within the midline anterior upper abdomen, deep to the surgical incision measuring 8 mm (2:24), splenic flexure measuring 11 mm (2:12), left paracolic  gutter measuring up to 1.0 cm (2: 29, 34, 40, 59), and right lower quadrant measuring 1.8 cm (2:59).     03/27/2024 Imaging   CT ABDOMEN PELVIS W CONTRAST Result Date: 03/27/2024 CLINICAL DATA:  History of ovarian cancer, monitor. * Tracking Code: BO * EXAM: CT ABDOMEN AND PELVIS WITH CONTRAST TECHNIQUE: Multidetector CT imaging of the abdomen and pelvis was performed using the standard protocol following bolus administration of intravenous contrast. RADIATION DOSE REDUCTION: This exam was performed according to the departmental dose-optimization program which includes automated exposure control, adjustment of the mA and/or kV according to patient size and/or use of iterative reconstruction technique. CONTRAST:  OMNIPAQUE  IOHEXOL  300 MG/ML  SOLN COMPARISON:  Multiple priors including CT January 31, 2024 FINDINGS: Lower chest: No acute abnormality. Partially visualized bilateral breast prostheses. Hepatobiliary: No suspicious hepatic lesion. Noninflamed gallbladder. No biliary ductal dilation. Pancreas: No pancreatic ductal dilation or evidence of acute inflammation. Spleen: Spleen is surgically absent. Adrenals/Urinary Tract: No suspicious adrenal nodule/mass. No hydronephrosis. Kidneys demonstrate symmetric enhancement. Urinary bladder is unremarkable for degree of distension. Stomach/Bowel: Stomach is unremarkable for degree of distension. No pathologic dilation of small or large bowel. Vascular/Lymphatic: Aortic  atherosclerosis. No pathologically enlarged abdominal or pelvic lymph nodes. Reproductive: Uterus is surgically absent. Other: Progressive nodular peritoneal thickening with increased peritoneal/omental nodularity and new small volume ascites. For reference: -left upper quadrant nodule measures 15 mm on image 14/2 previously 11 mm -anterior abdominal nodule measures 13 mm on image 27/2 previously 8 mm. Right ventral hernia contains to markedly thickened peritoneum with some free fluid. Musculoskeletal: No aggressive lytic or blastic lesion of bone. Multilevel degenerative changes spine. IMPRESSION: 1. Progressive nodular peritoneal thickening with increased peritoneal/omental nodularity and new small volume ascites, consistent with worsening peritoneal carcinomatosis. 2. Right ventral hernia contains markedly thickened peritoneum with some free fluid. 3.  Aortic Atherosclerosis (ICD10-I70.0). Electronically Signed   By: Tama Fails M.D.   On: 03/27/2024 14:44      03/28/2024 Tumor Marker   Patient's tumor was tested for the following markers: CA-125. Results of the tumor marker test revealed 355.    I discussed the assessment and treatment plan with the patient. The patient was provided an opportunity to ask questions and all were answered. The patient agreed with the plan and demonstrated an understanding of the instructions. The patient was advised to call back or seek an in-person evaluation if the symptoms worsen or if the condition fails to improve as anticipated.    I spent 40 minutes for the appointment reviewing test results, discuss management and coordination of care.  Almeda Jacobs, MD 03/29/2024 9:51 AM

## 2024-03-29 NOTE — Progress Notes (Signed)
 Order requisition sent to [facility] on accession [id number] per Dr. Marton Sleeper.

## 2024-03-29 NOTE — Assessment & Plan Note (Addendum)
 She has remote history of Hodgkin lymphoma and breast cancer She was diagnosed with stage III primary peritoneal cancer in 2023 treated with neoadjuvant chemotherapy followed by surgery and completion of chemotherapy by March 2024 Pathology high-grade serous carcinoma, ER positive, p53 mutated, BRCA2 positive  She is doing well on maintenance olaparib  with minimal side effects Unfortunately, recent imaging study and tumor marker showed disease progression/recurrence She is mildly symptomatic We will discontinue Olaparib  I explained to the patient why surgery is not indicated We discussed the role of chemotherapy We discussed risk, benefits, side effects of combination chemotherapy with carboplatin , paclitaxel  and bevacizumab versus carboplatin , gemcitabine with bevacizumab I will order molecular testing on her tissue sample for future molecular targets but the frontline of treatment for recurrent disease would be combination chemotherapy at this point At the end of our discussion, the patient is undecided She will travel back home this weekend and will let me know her final decision on Monday I will inform pharmacist that we will discontinue her current prescription With recurrent disease, I also told the patient that she is not considered curable and would likely need some form of maintenance treatment long-term

## 2024-04-02 ENCOUNTER — Telehealth: Payer: Self-pay

## 2024-04-02 ENCOUNTER — Encounter: Payer: Self-pay | Admitting: Hematology and Oncology

## 2024-04-02 NOTE — Telephone Encounter (Signed)
 Called her back to see if she has made a decision. She is leaning towards the 2nd option of carboplatin , gemcitabine with bevacizumab. She thinks that is the one Dr. Vilma Greathouse was leaning towards and is not sure?  She is complaining of abdominal discomfort and will try tylenol  tonight to see if it helps. She is complaining of lack of appetite and will try smaller meals. She is complaining of some shortness of breath with exertion and tiredness. She is tired from traveling and just getting home today.

## 2024-04-02 NOTE — Telephone Encounter (Signed)
 Called and left a message asking her to call the office back. Asking if she has made a decision regarding treatment.

## 2024-04-03 ENCOUNTER — Encounter: Payer: Self-pay | Admitting: Hematology and Oncology

## 2024-04-03 ENCOUNTER — Ambulatory Visit: Admitting: Hematology and Oncology

## 2024-04-03 ENCOUNTER — Other Ambulatory Visit: Payer: Self-pay | Admitting: Hematology and Oncology

## 2024-04-03 DIAGNOSIS — C482 Malignant neoplasm of peritoneum, unspecified: Secondary | ICD-10-CM

## 2024-04-03 NOTE — Telephone Encounter (Signed)
 Before I send scheduling msg, I am planning to start her on chemo on 6/19, is that OK?

## 2024-04-03 NOTE — Telephone Encounter (Signed)
 Called her and 6/19 works for her to start treatment. She has a lot of questions. She is asking when does Dr. Vilma Greathouse plan on seeing her again and when will she order a scan? How many cycles of treatment does Dr. Marton Sleeper plan on giving her?

## 2024-04-03 NOTE — Telephone Encounter (Signed)
 Called and given below message. She verbalized understanding. Appt scheduled at 0820 to see Dr. Freddrick Jaffe, she is aware of appt.

## 2024-04-03 NOTE — Telephone Encounter (Signed)
 I will see her same day before treatment to address her questions or would she prefer to see me Thursday face to face first?

## 2024-04-03 NOTE — Progress Notes (Signed)
 DISCONTINUE ON PATHWAY REGIMEN - Ovarian     A cycle is every 21 days:     Paclitaxel       Carboplatin    **Always confirm dose/schedule in your pharmacy ordering system**  PRIOR TREATMENT: [id number]: Carboplatin  AUC=6 + Paclitaxel  175 mg/m2 q21 Days x 2-4 Cycles  START OFF PATHWAY REGIMEN - Ovarian   [id number]:Bevacizumab 15 mg/kg D1 + Carboplatin  AUC=4 D1 + Gemcitabine 1,000 mg/m2 D1,8 q21 Days:   A cycle is every 21 days:     Bevacizumab-xxxx      Gemcitabine      Carboplatin    **Always confirm dose/schedule in your pharmacy ordering system**  Patient Characteristics: Recurrent or Progressive Disease, Second Line, Platinum Sensitive and ? 6 Months Since Last Therapy, Not a Candidate for Secondary Debulking Surgery BRCA Mutation Status: Present ([place]) Therapeutic Status: Recurrent or Progressive Disease Line of Therapy: Second Line Intent of Therapy: Non-Curative / Palliative Intent, Discussed with Patient

## 2024-04-04 ENCOUNTER — Other Ambulatory Visit: Payer: Self-pay

## 2024-04-05 ENCOUNTER — Inpatient Hospital Stay

## 2024-04-05 ENCOUNTER — Encounter: Payer: Self-pay | Admitting: Hematology and Oncology

## 2024-04-05 ENCOUNTER — Inpatient Hospital Stay: Admitting: Hematology and Oncology

## 2024-04-05 VITALS — BP 96/75 | HR 113 | Temp 97.5°F | Resp 18 | Ht 62.0 in | Wt 177.8 lb

## 2024-04-05 DIAGNOSIS — J984 Other disorders of lung: Secondary | ICD-10-CM | POA: Diagnosis not present

## 2024-04-05 DIAGNOSIS — R180 Malignant ascites: Secondary | ICD-10-CM | POA: Diagnosis not present

## 2024-04-05 DIAGNOSIS — R7989 Other specified abnormal findings of blood chemistry: Secondary | ICD-10-CM

## 2024-04-05 DIAGNOSIS — I2510 Atherosclerotic heart disease of native coronary artery without angina pectoris: Secondary | ICD-10-CM | POA: Diagnosis not present

## 2024-04-05 DIAGNOSIS — G620 Drug-induced polyneuropathy: Secondary | ICD-10-CM | POA: Diagnosis not present

## 2024-04-05 DIAGNOSIS — Z5111 Encounter for antineoplastic chemotherapy: Secondary | ICD-10-CM | POA: Diagnosis not present

## 2024-04-05 DIAGNOSIS — Z79899 Other long term (current) drug therapy: Secondary | ICD-10-CM | POA: Diagnosis not present

## 2024-04-05 DIAGNOSIS — Z853 Personal history of malignant neoplasm of breast: Secondary | ICD-10-CM | POA: Diagnosis not present

## 2024-04-05 DIAGNOSIS — N3289 Other specified disorders of bladder: Secondary | ICD-10-CM | POA: Diagnosis not present

## 2024-04-05 DIAGNOSIS — C482 Malignant neoplasm of peritoneum, unspecified: Secondary | ICD-10-CM

## 2024-04-05 DIAGNOSIS — K432 Incisional hernia without obstruction or gangrene: Secondary | ICD-10-CM | POA: Diagnosis not present

## 2024-04-05 DIAGNOSIS — Z5112 Encounter for antineoplastic immunotherapy: Secondary | ICD-10-CM | POA: Diagnosis not present

## 2024-04-05 DIAGNOSIS — K5730 Diverticulosis of large intestine without perforation or abscess without bleeding: Secondary | ICD-10-CM | POA: Diagnosis not present

## 2024-04-05 DIAGNOSIS — T451X5A Adverse effect of antineoplastic and immunosuppressive drugs, initial encounter: Secondary | ICD-10-CM

## 2024-04-05 DIAGNOSIS — Z0000 Encounter for general adult medical examination without abnormal findings: Secondary | ICD-10-CM | POA: Diagnosis not present

## 2024-04-05 DIAGNOSIS — Z9081 Acquired absence of spleen: Secondary | ICD-10-CM | POA: Diagnosis not present

## 2024-04-05 DIAGNOSIS — N1831 Chronic kidney disease, stage 3a: Secondary | ICD-10-CM | POA: Diagnosis not present

## 2024-04-05 DIAGNOSIS — I080 Rheumatic disorders of both mitral and aortic valves: Secondary | ICD-10-CM | POA: Diagnosis not present

## 2024-04-05 DIAGNOSIS — I700 Atherosclerosis of aorta: Secondary | ICD-10-CM | POA: Diagnosis not present

## 2024-04-05 LAB — CBC WITH DIFFERENTIAL (CANCER CENTER ONLY)
Abs Immature Granulocytes: 0.05 10*3/uL (ref 0.00–0.07)
Basophils Absolute: 0.1 10*3/uL (ref 0.0–0.1)
Basophils Relative: 1 %
Eosinophils Absolute: 0.3 10*3/uL (ref 0.0–0.5)
Eosinophils Relative: 3 %
HCT: 34 % — ABNORMAL LOW (ref 36.0–46.0)
Hemoglobin: 11.6 g/dL — ABNORMAL LOW (ref 12.0–15.0)
Immature Granulocytes: 1 %
Lymphocytes Relative: 20 %
Lymphs Abs: 2.1 10*3/uL (ref 0.7–4.0)
MCH: 33.6 pg (ref 26.0–34.0)
MCHC: 34.1 g/dL (ref 30.0–36.0)
MCV: 98.6 fL (ref 80.0–100.0)
Monocytes Absolute: 1.2 10*3/uL — ABNORMAL HIGH (ref 0.1–1.0)
Monocytes Relative: 11 %
Neutro Abs: 7.1 10*3/uL (ref 1.7–7.7)
Neutrophils Relative %: 64 %
Platelet Count: 540 10*3/uL — ABNORMAL HIGH (ref 150–400)
RBC: 3.45 MIL/uL — ABNORMAL LOW (ref 3.87–5.11)
RDW: 16.7 % — ABNORMAL HIGH (ref 11.5–15.5)
WBC Count: 10.9 10*3/uL — ABNORMAL HIGH (ref 4.0–10.5)
nRBC: 0 % (ref 0.0–0.2)

## 2024-04-05 LAB — CMP (CANCER CENTER ONLY)
ALT: 14 U/L (ref 0–44)
AST: 30 U/L (ref 15–41)
Albumin: 3.6 g/dL (ref 3.5–5.0)
Alkaline Phosphatase: 63 U/L (ref 38–126)
Anion gap: 8 (ref 5–15)
BUN: 15 mg/dL (ref 6–20)
CO2: 29 mmol/L (ref 22–32)
Calcium: 9.3 mg/dL (ref 8.9–10.3)
Chloride: 98 mmol/L (ref 98–111)
Creatinine: 1.2 mg/dL — ABNORMAL HIGH (ref 0.44–1.00)
GFR, Estimated: 52 mL/min — ABNORMAL LOW (ref >60)
Glucose, Bld: 107 mg/dL — ABNORMAL HIGH (ref 70–99)
Potassium: 4.8 mmol/L (ref 3.5–5.1)
Sodium: 135 mmol/L (ref 135–145)
Total Bilirubin: 0.4 mg/dL (ref 0.0–1.2)
Total Protein: 7.5 g/dL (ref 6.5–8.1)

## 2024-04-05 LAB — LIPID PANEL
Cholesterol: 209 mg/dL — ABNORMAL HIGH (ref 0–200)
HDL: 55 mg/dL (ref >40)
LDL Cholesterol: 122 mg/dL — ABNORMAL HIGH (ref 0–99)
Total CHOL/HDL Ratio: 3.8 ratio
Triglycerides: 158 mg/dL — ABNORMAL HIGH (ref <150)
VLDL: 32 mg/dL (ref 0–40)

## 2024-04-05 LAB — TOTAL PROTEIN, URINE DIPSTICK: Protein, ur: 30 mg/dL — AB

## 2024-04-05 LAB — HEMOGLOBIN A1C
Hgb A1c MFr Bld: 5.7 % — ABNORMAL HIGH (ref 4.8–5.6)
Mean Plasma Glucose: 116.89 mg/dL

## 2024-04-05 LAB — VITAMIN D 25 HYDROXY (VIT D DEFICIENCY, FRACTURES): Vit D, 25-Hydroxy: 67.7 ng/mL (ref 30–100)

## 2024-04-05 MED ORDER — PROCHLORPERAZINE MALEATE 10 MG PO TABS: 10.0000 mg | ORAL_TABLET | Freq: Four times a day (QID) | ORAL | 1 refills | Status: AC | PRN

## 2024-04-05 MED ORDER — ONDANSETRON HCL 8 MG PO TABS
8.0000 mg | ORAL_TABLET | Freq: Three times a day (TID) | ORAL | 1 refills | Status: AC | PRN
Start: 2024-04-05 — End: ?

## 2024-04-05 MED ORDER — OXYCODONE HCL 5 MG PO TABS: 5.0000 mg | ORAL_TABLET | ORAL | 0 refills | Status: DC | PRN

## 2024-04-05 MED ORDER — DEXAMETHASONE 4 MG PO TABS: ORAL_TABLET | ORAL | 6 refills | Status: DC

## 2024-04-05 NOTE — Assessment & Plan Note (Addendum)
 She has remote history of Hodgkin lymphoma and breast cancer She was diagnosed with stage III primary peritoneal cancer in 2023 treated with neoadjuvant chemotherapy followed by surgery and completion of chemotherapy by March 2024 Pathology high-grade serous carcinoma, ER positive, p53 mutated, BRCA2 positive  She is doing well on maintenance olaparib  with minimal side effects. Unfortunately, recent imaging study and tumor marker from June 2025 showed disease progression/recurrence She is mildly symptomatic with abdominal swelling I explained to the patient why surgery is not indicated We discussed the role of chemotherapy We discussed risk, benefits, side effects of combination chemotherapy with carboplatin , paclitaxel  and bevacizumab versus carboplatin , gemcitabine with bevacizumab I will order molecular testing on her tissue sample for future molecular targets but the frontline of treatment for recurrent disease would be combination chemotherapy at this point After long discussion, she is in agreement to proceed with carboplatin , paclitaxel  and bevacizumab I plan to repeat imaging study after 3 cycles of treatment

## 2024-04-05 NOTE — Assessment & Plan Note (Addendum)
 She had prior neuropathy from chemo I will start paclitaxel  with slight dose reduction

## 2024-04-05 NOTE — Progress Notes (Signed)
 DISCONTINUE OFF PATHWAY REGIMEN - Ovarian   [id number]:Bevacizumab 15 mg/kg D1 + Carboplatin  AUC=4 D1 + Gemcitabine 1,000 mg/m2 D1,8 q21 Days:   A cycle is every 21 days:     Bevacizumab-xxxx      Gemcitabine      Carboplatin    **Always confirm dose/schedule in your pharmacy ordering system**  PRIOR TREATMENT: Off Pathway: Bevacizumab 15 mg/kg D1 + Carboplatin  AUC=4 D1 + Gemcitabine 1,000 mg/m2 D1,8 q21 Days  START OFF PATHWAY REGIMEN - Ovarian   [id number]:Bevacizumab 15 mg/kg IV D1 + Carboplatin  AUC=5 IV D1 + Paclitaxel  175 mg/m2 IV D1 q21 Days:   A cycle is every 21 days:     Bevacizumab-xxxx      Paclitaxel       Carboplatin    **Always confirm dose/schedule in your pharmacy ordering system**  Patient Characteristics: Recurrent or Progressive Disease, Second Line, Platinum Sensitive and ? 6 Months Since Last Therapy, Not a Candidate for Secondary Debulking Surgery BRCA Mutation Status: Present (Germline) Therapeutic Status: Recurrent or Progressive Disease Line of Therapy: Second Line Intent of Therapy: Non-Curative / Palliative Intent, Discussed with Patient

## 2024-04-05 NOTE — Assessment & Plan Note (Addendum)
 She has intermittent elevated serum creatinine and I suspect she have chronic kidney disease Will monitor closely and adjust the dose of her chemotherapy accordingly

## 2024-04-05 NOTE — Progress Notes (Signed)
 [facility] OFFICE PROGRESS NOTE  Patient Care Team: Jhon Moselle, FNP as PCP - General (Internal Medicine) Kyra Phy, MD as PCP - Cardiology (Cardiology) Tami Falcon, MD as Consulting Physician (Gastroenterology)  Assessment & Plan Primary peritoneal adenocarcinoma [facility]) She has remote history of Hodgkin lymphoma and breast cancer She was diagnosed with stage III primary peritoneal cancer in 2023 treated with neoadjuvant chemotherapy followed by surgery and completion of chemotherapy by March 2024 Pathology high-grade serous carcinoma, ER positive, p53 mutated, BRCA2 positive  She is doing well on maintenance olaparib  with minimal side effects. Unfortunately, recent imaging study and tumor marker from June 2025 showed disease progression/recurrence She is mildly symptomatic with abdominal swelling I explained to the patient why surgery is not indicated We discussed the role of chemotherapy We discussed risk, benefits, side effects of combination chemotherapy with carboplatin , paclitaxel  and bevacizumab versus carboplatin , gemcitabine with bevacizumab I will order molecular testing on her tissue sample for future molecular targets but the frontline of treatment for recurrent disease would be combination chemotherapy at this point After long discussion, she is in agreement to proceed with carboplatin , paclitaxel  and bevacizumab I plan to repeat imaging study after 3 cycles of treatment Peripheral neuropathy due to chemotherapy [facility]) She had prior neuropathy from chemo I will start paclitaxel  with slight dose reduction Elevated serum creatinine She has intermittent elevated serum creatinine and I suspect she have chronic kidney disease Will monitor closely and adjust the dose of her chemotherapy accordingly  Orders Placed This Encounter  Procedures   CBC with Differential (Cancer Center Only)    Standing Status:   Future    Expected Date:   05/03/2024     Expiration Date:   05/03/2025   CMP (Cancer Center only)    Standing Status:   Future    Expected Date:   05/03/2024    Expiration Date:   05/03/2025   CBC with Differential (Cancer Center Only)    Standing Status:   Future    Expected Date:   05/24/2024    Expiration Date:   05/24/2025   CMP (Cancer Center only)    Standing Status:   Future    Expected Date:   05/24/2024    Expiration Date:   05/24/2025   Total Protein, Urine dipstick    Standing Status:   Future    Expected Date:   05/24/2024    Expiration Date:   05/24/2025   CBC with Differential (Cancer Center Only)    Standing Status:   Future    Expected Date:   06/14/2024    Expiration Date:   06/14/2025   CMP (Cancer Center only)    Standing Status:   Future    Expected Date:   06/14/2024    Expiration Date:   06/14/2025   CBC with Differential (Cancer Center Only)    Standing Status:   Future    Expected Date:   07/05/2024    Expiration Date:   07/05/2025   CMP (Cancer Center only)    Standing Status:   Future    Expected Date:   07/05/2024    Expiration Date:   07/05/2025   CBC with Differential (Cancer Center Only)    Standing Status:   Future    Expected Date:   07/26/2024    Expiration Date:   07/26/2025   CMP (Cancer Center only)    Standing Status:   Future    Expected Date:   07/26/2024  Expiration Date:   07/26/2025   Total Protein, Urine dipstick    Standing Status:   Future    Expected Date:   07/26/2024    Expiration Date:   07/26/2025   CBC with Differential (Cancer Center Only)    Standing Status:   Future    Expected Date:   08/16/2024    Expiration Date:   08/16/2025   CMP (Cancer Center only)    Standing Status:   Future    Expected Date:   08/16/2024    Expiration Date:   08/16/2025   CBC with Differential (Cancer Center Only)    Standing Status:   Future    Expected Date:   09/06/2024    Expiration Date:   09/06/2025   CMP (Cancer Center only)    Standing Status:   Future    Expected Date:    09/06/2024    Expiration Date:   09/06/2025   VITAMIN D  25 Hydroxy (Vit-D Deficiency, Fractures)    Standing Status:   Future    Number of Occurrences:   1    Expected Date:   04/05/2024    Expiration Date:   04/05/2025   CBC with Differential (Cancer Center Only)    Standing Status:   Future    Number of Occurrences:   1    Expected Date:   04/05/2024    Expiration Date:   04/05/2025   CMP (Cancer Center only)    Standing Status:   Future    Number of Occurrences:   1    Expected Date:   04/05/2024    Expiration Date:   04/05/2025   Lipid panel    Standing Status:   Future    Number of Occurrences:   1    Expected Date:   04/05/2024    Expiration Date:   04/05/2025   Hemoglobin A1c    Standing Status:   Future    Number of Occurrences:   1    Expected Date:   04/05/2024    Expiration Date:   04/05/2025   Total Protein, Urine dipstick    Standing Status:   Future    Number of Occurrences:   1    Expected Date:   04/05/2024    Expiration Date:   04/05/2025     Almeda Jacobs, MD  INTERVAL HISTORY: she returns for surveillance follow-up and review of plan of care She returned from her trip I reviewed multiple imaging studies with her She has mild abdominal distention/swelling We have a long discussion about risk and benefits of different options Ultimately, after long discussion, she made informed decision to proceed with combination of carboplatin , paclitaxel  and bevacizumab The patient is instructed to start checking her blood pressure twice daily Will continue cryotherapy before paclitaxel  and she will take premedication dexamethasone  prior to treatment to reduce risk of allergic reaction  PHYSICAL EXAMINATION: ECOG PERFORMANCE STATUS: 1 - Symptomatic but completely ambulatory  Vitals:   04/05/24 0839  BP: 96/75  Pulse: (!) 113  Resp: 18  Temp: (!) 97.5 F (36.4 C)   Filed Weights   04/05/24 0839  Weight: 177 lb 12.8 oz (80.6 kg)    Relevant data reviewed during this  visit included CBC, CMP, CA125, CT imaging from June 2025 in comparison with imaging study from April 2025

## 2024-04-08 ENCOUNTER — Encounter: Payer: Self-pay | Admitting: Hematology and Oncology

## 2024-04-09 ENCOUNTER — Telehealth: Payer: Self-pay

## 2024-04-09 ENCOUNTER — Ambulatory Visit (HOSPITAL_COMMUNITY)
Admission: RE | Admit: 2024-04-09 | Discharge: 2024-04-09 | Disposition: A | Discharge status: Home / Self Care | Source: Ambulatory Visit | Attending: Hematology and Oncology | Admitting: Hematology and Oncology

## 2024-04-09 ENCOUNTER — Other Ambulatory Visit: Payer: Self-pay | Admitting: Hematology and Oncology

## 2024-04-09 DIAGNOSIS — C482 Malignant neoplasm of peritoneum, unspecified: Secondary | ICD-10-CM | POA: Insufficient documentation

## 2024-04-09 DIAGNOSIS — R180 Malignant ascites: Secondary | ICD-10-CM | POA: Diagnosis not present

## 2024-04-09 MED ORDER — LIDOCAINE HCL 1 % IJ SOLN
INTRAMUSCULAR | Status: AC
  Filled 2024-04-09: qty 20

## 2024-04-09 NOTE — Procedures (Signed)
 Ultrasound-guided therapeutic paracentesis performed yielding 5 liters (maximum ordered) of amber colored fluid. No immediate complications. EBL none.

## 2024-04-09 NOTE — Telephone Encounter (Signed)
 Called regarding mychart message. Reminded her per Dr. Marton Sleeper to call the office for urgent needs and not send a mychart message. Paracentesis scheduled today at [facility] 2:30 pm, arrive at 2 pm. She verbalized understanding and is aware of appt date/time.

## 2024-04-11 ENCOUNTER — Other Ambulatory Visit: Payer: Self-pay

## 2024-04-12 ENCOUNTER — Inpatient Hospital Stay

## 2024-04-12 VITALS — BP 128/63 | HR 88 | Temp 98.2°F | Resp 18

## 2024-04-12 DIAGNOSIS — K432 Incisional hernia without obstruction or gangrene: Secondary | ICD-10-CM | POA: Diagnosis not present

## 2024-04-12 DIAGNOSIS — Z5112 Encounter for antineoplastic immunotherapy: Secondary | ICD-10-CM | POA: Diagnosis not present

## 2024-04-12 DIAGNOSIS — J984 Other disorders of lung: Secondary | ICD-10-CM | POA: Diagnosis not present

## 2024-04-12 DIAGNOSIS — C482 Malignant neoplasm of peritoneum, unspecified: Secondary | ICD-10-CM | POA: Diagnosis not present

## 2024-04-12 DIAGNOSIS — I2510 Atherosclerotic heart disease of native coronary artery without angina pectoris: Secondary | ICD-10-CM | POA: Diagnosis not present

## 2024-04-12 DIAGNOSIS — Z5111 Encounter for antineoplastic chemotherapy: Secondary | ICD-10-CM | POA: Diagnosis not present

## 2024-04-12 DIAGNOSIS — I700 Atherosclerosis of aorta: Secondary | ICD-10-CM | POA: Diagnosis not present

## 2024-04-12 DIAGNOSIS — I080 Rheumatic disorders of both mitral and aortic valves: Secondary | ICD-10-CM | POA: Diagnosis not present

## 2024-04-12 DIAGNOSIS — K5730 Diverticulosis of large intestine without perforation or abscess without bleeding: Secondary | ICD-10-CM | POA: Diagnosis not present

## 2024-04-12 DIAGNOSIS — Z9081 Acquired absence of spleen: Secondary | ICD-10-CM | POA: Diagnosis not present

## 2024-04-12 DIAGNOSIS — N3289 Other specified disorders of bladder: Secondary | ICD-10-CM | POA: Diagnosis not present

## 2024-04-12 DIAGNOSIS — R180 Malignant ascites: Secondary | ICD-10-CM | POA: Diagnosis not present

## 2024-04-12 DIAGNOSIS — Z79899 Other long term (current) drug therapy: Secondary | ICD-10-CM | POA: Diagnosis not present

## 2024-04-12 MED ORDER — SODIUM CHLORIDE 0.9 % IV SOLN
450.5000 mg | Freq: Once | INTRAVENOUS | Status: AC
  Administered 2024-04-12: 450 mg via INTRAVENOUS
  Filled 2024-04-12: qty 45

## 2024-04-12 MED ORDER — FOSAPREPITANT DIMEGLUMINE INJECTION 150 MG
150.0000 mg | Freq: Once | INTRAVENOUS | Status: AC
  Administered 2024-04-12: 150 mg via INTRAVENOUS
  Filled 2024-04-12: qty 150

## 2024-04-12 MED ORDER — CETIRIZINE HCL 10 MG/ML IV SOLN
10.0000 mg | Freq: Once | INTRAVENOUS | Status: AC
  Administered 2024-04-12: 10 mg via INTRAVENOUS
  Filled 2024-04-12: qty 1

## 2024-04-12 MED ORDER — SODIUM CHLORIDE 0.9 % IV SOLN
15.0000 mg/kg | Freq: Once | INTRAVENOUS | Status: AC
  Administered 2024-04-12: 1200 mg via INTRAVENOUS
  Filled 2024-04-12: qty 48

## 2024-04-12 MED ORDER — SODIUM CHLORIDE 0.9 % IV SOLN: INTRAVENOUS | Status: DC

## 2024-04-12 MED ORDER — PALONOSETRON HCL INJECTION 0.25 MG/5ML
0.2500 mg | Freq: Once | INTRAVENOUS | Status: AC
  Administered 2024-04-12: 0.25 mg via INTRAVENOUS
  Filled 2024-04-12: qty 5

## 2024-04-12 MED ORDER — DEXAMETHASONE SODIUM PHOSPHATE 10 MG/ML IJ SOLN
10.0000 mg | Freq: Once | INTRAMUSCULAR | Status: AC
  Administered 2024-04-12: 10 mg via INTRAVENOUS

## 2024-04-12 MED ORDER — SODIUM CHLORIDE 0.9 % IV SOLN
131.2500 mg/m2 | Freq: Once | INTRAVENOUS | Status: AC
  Administered 2024-04-12: 246 mg via INTRAVENOUS
  Filled 2024-04-12: qty 41

## 2024-04-12 MED ORDER — FAMOTIDINE IN NACL 20-0.9 MG/50ML-% IV SOLN
20.0000 mg | Freq: Once | INTRAVENOUS | Status: AC
  Administered 2024-04-12: 20 mg via INTRAVENOUS
  Filled 2024-04-12: qty 50

## 2024-04-12 NOTE — Patient Instructions (Signed)
 [facility] - A DEPT OF [facility]  Discharge Instructions: Thank you for choosing [facility] to provide your oncology and hematology care.   If you have a lab appointment with the Cancer Center, please go directly to the Cancer Center and check in at the registration area.   Wear comfortable clothing and clothing appropriate for easy access to any Portacath or PICC line.   We strive to give you quality time with your provider. You may need to reschedule your appointment if you arrive late (15 or more minutes).  Arriving late affects you and other patients whose appointments are after yours.  Also, if you miss three or more appointments without notifying the office, you may be dismissed from the clinic at the provider's discretion.      For prescription refill requests, have your pharmacy contact our office and allow 72 hours for refills to be completed.    Today you received the following chemotherapy and/or immunotherapy agents Taxol , Carboplatin , Avastin     To help prevent nausea and vomiting after your treatment, we encourage you to take your nausea medication as directed.  BELOW ARE SYMPTOMS THAT SHOULD BE REPORTED IMMEDIATELY: *FEVER GREATER THAN 100.4 F (38 C) OR HIGHER *CHILLS OR SWEATING *NAUSEA AND VOMITING THAT IS NOT CONTROLLED WITH YOUR NAUSEA MEDICATION *UNUSUAL SHORTNESS OF BREATH *UNUSUAL BRUISING OR BLEEDING *URINARY PROBLEMS (pain or burning when urinating, or frequent urination) *BOWEL PROBLEMS (unusual diarrhea, constipation, pain near the anus) TENDERNESS IN MOUTH AND THROAT WITH OR WITHOUT PRESENCE OF ULCERS (sore throat, sores in mouth, or a toothache) UNUSUAL RASH, SWELLING OR PAIN  UNUSUAL VAGINAL DISCHARGE OR ITCHING   Items with * indicate a potential emergency and should be followed up as soon as possible or go to the Emergency Department if any problems should occur.  Please show the CHEMOTHERAPY ALERT CARD  or IMMUNOTHERAPY ALERT CARD at check-in to the Emergency Department and triage nurse.  Should you have questions after your visit or need to cancel or reschedule your appointment, please contact [facility] - A DEPT OF [facility]  Dept: [phone]  and follow the prompts.  Office hours are 8:00 a.m. to 4:30 p.m. Monday - Friday. Please note that voicemails left after 4:00 p.m. may not be returned until the following business day.  We are closed weekends and major holidays. You have access to a nurse at all times for urgent questions. Please call the main number to the clinic Dept: [phone] and follow the prompts.   For any non-urgent questions, you may also contact your provider using MyChart. We now offer e-Visits for anyone 27 and older to request care online for non-urgent symptoms. For details visit [email or web address].   Also download the MyChart app! Go to the app store, search MyChart, open the app, select [facility], and log in with your MyChart username and password.

## 2024-04-13 ENCOUNTER — Encounter: Payer: Self-pay | Admitting: Hematology and Oncology

## 2024-04-13 NOTE — Telephone Encounter (Signed)
-----   Message from Nurse Reymundo Caulk E sent at 04/12/2024  2:13 PM EDT ----- Regarding: first time- avastan-gorsuch First time treatment call back- gorsuch avastan tolerated well

## 2024-04-13 NOTE — Telephone Encounter (Signed)
 Chemo Call Back.  Reached pt to discuss how she did with her recent chemo.  She reports doing well & denies any problems.  She knows her next appt & how to reach us  if needed.  We discussed appts, bleeding, joint/muscle pain & nausea.

## 2024-04-16 ENCOUNTER — Telehealth: Payer: Self-pay

## 2024-04-16 ENCOUNTER — Encounter: Payer: Self-pay | Admitting: Hematology and Oncology

## 2024-04-16 NOTE — Telephone Encounter (Signed)
 Called to follow up and see how she is doing today. She is doing much better this week. She denies needing a paracentesis. She is able to eat/ drink with no problems. She started having aches/ pain yesterday. She is taking tylenol  prn and started taking Claritin that is helping. She will call the office back for questions/ concerns.

## 2024-04-20 ENCOUNTER — Telehealth: Payer: Self-pay

## 2024-04-20 NOTE — Telephone Encounter (Signed)
 Returned her call. She is asking about synthroid  medication. Instructed to contact PCP for refill who is filling Rx. She verbalized understanding.

## 2024-05-02 MED FILL — Fosaprepitant Dimeglumine For IV Infusion 150 MG (Base Eq): INTRAVENOUS | Qty: 5 | Status: AC

## 2024-05-03 ENCOUNTER — Encounter: Payer: Self-pay | Admitting: Hematology and Oncology

## 2024-05-03 ENCOUNTER — Inpatient Hospital Stay: Attending: Gynecologic Oncology

## 2024-05-03 ENCOUNTER — Inpatient Hospital Stay: Admitting: Hematology and Oncology

## 2024-05-03 ENCOUNTER — Inpatient Hospital Stay

## 2024-05-03 VITALS — HR 100

## 2024-05-03 VITALS — BP 114/71 | HR 108 | Temp 97.9°F | Resp 18 | Ht 62.0 in | Wt 165.6 lb

## 2024-05-03 DIAGNOSIS — D649 Anemia, unspecified: Secondary | ICD-10-CM | POA: Diagnosis not present

## 2024-05-03 DIAGNOSIS — R300 Dysuria: Secondary | ICD-10-CM

## 2024-05-03 DIAGNOSIS — Z170 Estrogen receptor positive status [ER+]: Secondary | ICD-10-CM | POA: Insufficient documentation

## 2024-05-03 DIAGNOSIS — Z79899 Other long term (current) drug therapy: Secondary | ICD-10-CM | POA: Insufficient documentation

## 2024-05-03 DIAGNOSIS — T451X5A Adverse effect of antineoplastic and immunosuppressive drugs, initial encounter: Secondary | ICD-10-CM | POA: Diagnosis not present

## 2024-05-03 DIAGNOSIS — R188 Other ascites: Secondary | ICD-10-CM | POA: Insufficient documentation

## 2024-05-03 DIAGNOSIS — Z1501 Genetic susceptibility to malignant neoplasm of breast: Secondary | ICD-10-CM | POA: Insufficient documentation

## 2024-05-03 DIAGNOSIS — C482 Malignant neoplasm of peritoneum, unspecified: Secondary | ICD-10-CM | POA: Diagnosis not present

## 2024-05-03 DIAGNOSIS — G620 Drug-induced polyneuropathy: Secondary | ICD-10-CM

## 2024-05-03 DIAGNOSIS — R000 Tachycardia, unspecified: Secondary | ICD-10-CM | POA: Insufficient documentation

## 2024-05-03 DIAGNOSIS — Z5112 Encounter for antineoplastic immunotherapy: Secondary | ICD-10-CM | POA: Diagnosis not present

## 2024-05-03 DIAGNOSIS — R809 Proteinuria, unspecified: Secondary | ICD-10-CM | POA: Diagnosis not present

## 2024-05-03 DIAGNOSIS — Z79633 Long term (current) use of mitotic inhibitor: Secondary | ICD-10-CM | POA: Insufficient documentation

## 2024-05-03 DIAGNOSIS — Z5111 Encounter for antineoplastic chemotherapy: Secondary | ICD-10-CM | POA: Diagnosis not present

## 2024-05-03 DIAGNOSIS — Z79630 Long term (current) use of alkylating agent: Secondary | ICD-10-CM | POA: Diagnosis not present

## 2024-05-03 DIAGNOSIS — Z1509 Genetic susceptibility to other malignant neoplasm: Secondary | ICD-10-CM | POA: Insufficient documentation

## 2024-05-03 LAB — CMP (CANCER CENTER ONLY)
ALT: 17 U/L (ref 0–44)
AST: 18 U/L (ref 15–41)
Albumin: 3.6 g/dL (ref 3.5–5.0)
Alkaline Phosphatase: 102 U/L (ref 38–126)
Anion gap: 9 (ref 5–15)
BUN: 21 mg/dL — ABNORMAL HIGH (ref 6–20)
CO2: 26 mmol/L (ref 22–32)
Calcium: 9.6 mg/dL (ref 8.9–10.3)
Chloride: 102 mmol/L (ref 98–111)
Creatinine: 0.92 mg/dL (ref 0.44–1.00)
GFR, Estimated: >60 mL/min (ref >60)
Glucose, Bld: 144 mg/dL — ABNORMAL HIGH (ref 70–99)
Potassium: 4.1 mmol/L (ref 3.5–5.1)
Sodium: 137 mmol/L (ref 135–145)
Total Bilirubin: 0.2 mg/dL (ref 0.0–1.2)
Total Protein: 8.2 g/dL — ABNORMAL HIGH (ref 6.5–8.1)

## 2024-05-03 LAB — URINALYSIS, COMPLETE (UACMP) WITH MICROSCOPIC
Bacteria, UA: NONE SEEN
Bilirubin Urine: NEGATIVE
Glucose, UA: NEGATIVE mg/dL
Hgb urine dipstick: NEGATIVE
Ketones, ur: NEGATIVE mg/dL
Nitrite: NEGATIVE
Protein, ur: 100 mg/dL — AB
Specific Gravity, Urine: 1.023 (ref 1.005–1.030)
pH: 5 (ref 5.0–8.0)

## 2024-05-03 LAB — CBC WITH DIFFERENTIAL (CANCER CENTER ONLY)
Abs Immature Granulocytes: 0.08 K/uL — ABNORMAL HIGH (ref 0.00–0.07)
Basophils Absolute: 0 K/uL (ref 0.0–0.1)
Basophils Relative: 0 %
Eosinophils Absolute: 0 K/uL (ref 0.0–0.5)
Eosinophils Relative: 0 %
HCT: 31 % — ABNORMAL LOW (ref 36.0–46.0)
Hemoglobin: 10.6 g/dL — ABNORMAL LOW (ref 12.0–15.0)
Immature Granulocytes: 1 %
Lymphocytes Relative: 13 %
Lymphs Abs: 1.5 K/uL (ref 0.7–4.0)
MCH: 32.1 pg (ref 26.0–34.0)
MCHC: 34.2 g/dL (ref 30.0–36.0)
MCV: 93.9 fL (ref 80.0–100.0)
Monocytes Absolute: 0.3 K/uL (ref 0.1–1.0)
Monocytes Relative: 2 %
Neutro Abs: 10.1 K/uL — ABNORMAL HIGH (ref 1.7–7.7)
Neutrophils Relative %: 84 %
Platelet Count: 477 K/uL — ABNORMAL HIGH (ref 150–400)
RBC: 3.3 MIL/uL — ABNORMAL LOW (ref 3.87–5.11)
RDW: 18.1 % — ABNORMAL HIGH (ref 11.5–15.5)
WBC Count: 12 K/uL — ABNORMAL HIGH (ref 4.0–10.5)
nRBC: 0 % (ref 0.0–0.2)

## 2024-05-03 MED ORDER — SODIUM CHLORIDE 0.9% FLUSH: 10.0000 mL | INTRAVENOUS | Status: DC | PRN

## 2024-05-03 MED ORDER — DEXAMETHASONE SODIUM PHOSPHATE 10 MG/ML IJ SOLN
10.0000 mg | Freq: Once | INTRAMUSCULAR | Status: AC
  Administered 2024-05-03: 10 mg via INTRAVENOUS
  Filled 2024-05-03: qty 1

## 2024-05-03 MED ORDER — PALONOSETRON HCL INJECTION 0.25 MG/5ML
0.2500 mg | Freq: Once | INTRAVENOUS | Status: AC
  Administered 2024-05-03: 0.25 mg via INTRAVENOUS
  Filled 2024-05-03: qty 5

## 2024-05-03 MED ORDER — SODIUM CHLORIDE 0.9 % IV SOLN
15.0000 mg/kg | Freq: Once | INTRAVENOUS | Status: AC
  Administered 2024-05-03: 1200 mg via INTRAVENOUS
  Filled 2024-05-03: qty 48

## 2024-05-03 MED ORDER — SODIUM CHLORIDE 0.9 % IV SOLN
150.0000 mg | Freq: Once | INTRAVENOUS | Status: AC
  Administered 2024-05-03: 150 mg via INTRAVENOUS
  Filled 2024-05-03: qty 150

## 2024-05-03 MED ORDER — FAMOTIDINE IN NACL 20-0.9 MG/50ML-% IV SOLN
20.0000 mg | Freq: Once | INTRAVENOUS | Status: AC
  Administered 2024-05-03: 20 mg via INTRAVENOUS
  Filled 2024-05-03: qty 50

## 2024-05-03 MED ORDER — SODIUM CHLORIDE 0.9% FLUSH
10.0000 mL | Freq: Once | INTRAVENOUS | Status: AC
  Administered 2024-05-03: 10 mL

## 2024-05-03 MED ORDER — CETIRIZINE HCL 10 MG/ML IV SOLN
10.0000 mg | Freq: Once | INTRAVENOUS | Status: AC
  Administered 2024-05-03: 10 mg via INTRAVENOUS
  Filled 2024-05-03: qty 1

## 2024-05-03 MED ORDER — HEPARIN SOD (PORK) LOCK FLUSH 100 UNIT/ML IV SOLN: 500.0000 [IU] | Freq: Once | INTRAVENOUS | Status: DC | PRN

## 2024-05-03 MED ORDER — SODIUM CHLORIDE 0.9 % IV SOLN: INTRAVENOUS | Status: DC

## 2024-05-03 MED ORDER — SODIUM CHLORIDE 0.9 % IV SOLN
450.0000 mg | Freq: Once | INTRAVENOUS | Status: AC
  Administered 2024-05-03: 450 mg via INTRAVENOUS
  Filled 2024-05-03: qty 45

## 2024-05-03 MED ORDER — SODIUM CHLORIDE 0.9 % IV SOLN
131.2500 mg/m2 | Freq: Once | INTRAVENOUS | Status: AC
  Administered 2024-05-03: 246 mg via INTRAVENOUS
  Filled 2024-05-03: qty 41

## 2024-05-03 NOTE — Progress Notes (Signed)
 [facility] OFFICE PROGRESS NOTE  Patient Care Team: [name], FNP as PCP - General (Internal Medicine) [name], MD as PCP - Cardiology (Cardiology) [name], MD as Consulting Physician (Gastroenterology) Shellia Almarie BROCKS, RN as Registered Nurse  Assessment & Plan Dysuria She has mild symptom of dysuria Will order urinalysis and urine culture Primary peritoneal adenocarcinoma [facility]) She has remote history of Hodgkin lymphoma and breast cancer She was diagnosed with stage III primary peritoneal cancer in 2023 treated with neoadjuvant chemotherapy followed by surgery and completion of chemotherapy by March 2024 Pathology high-grade serous carcinoma, ER positive, p53 mutated, BRCA2 positive CARIS molecular testing, HRD positive, BRCA to positive, PD-L1 CPS score 5%, p53 mutation positive, MSI stable, low tumor mutation burden, BRAF negative, ER 50% positive, folate receptor 1 negative, 2+, 35%, HER2/neu 0  She is doing well on maintenance olaparib  with minimal side effects. Unfortunately, recent imaging study and tumor marker from June 2025 showed disease progression/recurrence She resumed chemotherapy with carboplatin , paclitaxel  and bevacizumab  She required paracentesis x 1 but since then, she has lost a lot of weight and her ascites has never returned Today, her husband has a lot of questions regarding rationale behind no surgery, radiation etc. and I addressed his questions to his satisfaction We will proceed with chemotherapy today I will plan to repeat imaging study after cycle 3 of treatment Peripheral neuropathy due to chemotherapy [facility]) She had prior neuropathy from chemo This is stable Will proceed with similar dose reduction as before  Orders Placed This Encounter  Procedures   Urine Culture    Standing Status:   Future    Number of Occurrences:   1    Expected Date:   05/03/2024    Expiration Date:   05/03/2025   Urinalysis, Complete  w Microscopic    Standing Status:   Future    Number of Occurrences:   1    Expected Date:   05/03/2024    Expiration Date:   05/03/2025     Almarie Bedford, MD  INTERVAL HISTORY: she returns for treatment follow-up with her husband Her husband has numerous questions related to other treatment modality and plan of care Complications related to previous cycle of chemotherapy included anemia,, peripheral neuropathy,, and slight dysuria No urinary frequency, urgency or fever No worsening peripheral neuropathy  PHYSICAL EXAMINATION: ECOG PERFORMANCE STATUS: 1 - Symptomatic but completely ambulatory  Lab Results  Component Value Date   CAN125 355.0 (H) 03/27/2024   CAN125 27.6 01/19/2024   CAN125 8.5 11/22/2023      Latest Ref Rng & Units 05/03/2024   10:14 AM 04/05/2024    9:29 AM 03/27/2024    9:20 AM  CBC  WBC 4.0 - 10.5 K/uL 12.0  10.9  10.3   Hemoglobin 12.0 - 15.0 g/dL 89.3  88.3  89.1   Hematocrit 36.0 - 46.0 % 31.0  34.0  31.0   Platelets 150 - 400 K/uL 477  540  485       Chemistry      Component Value Date/Time   NA 137 05/03/2024 1014   K 4.1 05/03/2024 1014   CL 102 05/03/2024 1014   CO2 26 05/03/2024 1014   BUN 21 (H) 05/03/2024 1014   CREATININE 0.92 05/03/2024 1014      Component Value Date/Time   CALCIUM  9.6 05/03/2024 1014   ALKPHOS 102 05/03/2024 1014   AST 18 05/03/2024 1014   ALT 17 05/03/2024 1014   BILITOT  0.2 05/03/2024 1014       Vitals:   05/03/24 1035  BP: 114/71  Pulse: (!) 108  Resp: 18  Temp: 97.9 F (36.6 C)  SpO2: 99%   Filed Weights   05/03/24 1035  Weight: 165 lb 9.6 oz (75.1 kg)   Other relevant data reviewed during this visit included CBC, CMP, urinalysis

## 2024-05-03 NOTE — Assessment & Plan Note (Addendum)
 She has remote history of Hodgkin lymphoma and breast cancer She was diagnosed with stage III primary peritoneal cancer in 2023 treated with neoadjuvant chemotherapy followed by surgery and completion of chemotherapy by March 2024 Pathology high-grade serous carcinoma, ER positive, p53 mutated, BRCA2 positive CARIS molecular testing, HRD positive, BRCA to positive, PD-L1 CPS score 5%, p53 mutation positive, MSI stable, low tumor mutation burden, BRAF negative, ER 50% positive, folate receptor 1 negative, 2+, 35%, HER2/neu 0  She is doing well on maintenance olaparib  with minimal side effects. Unfortunately, recent imaging study and tumor marker from June 2025 showed disease progression/recurrence She resumed chemotherapy with carboplatin , paclitaxel  and bevacizumab  She required paracentesis x 1 but since then, she has lost a lot of weight and her ascites has never returned Today, her husband has a lot of questions regarding rationale behind no surgery, radiation etc. and I addressed his questions to his satisfaction We will proceed with chemotherapy today I will plan to repeat imaging study after cycle 3 of treatment

## 2024-05-03 NOTE — Assessment & Plan Note (Addendum)
 She has mild symptom of dysuria Will order urinalysis and urine culture

## 2024-05-03 NOTE — Assessment & Plan Note (Addendum)
 She had prior neuropathy from chemo This is stable Will proceed with similar dose reduction as before

## 2024-05-03 NOTE — Patient Instructions (Signed)
 [facility] - A DEPT OF [place]  Discharge Instructions: Thank you for choosing [facility] to provide your oncology and hematology care.   If you have a lab appointment with the Cancer Center, please go directly to the Cancer Center and check in at the registration area.   Wear comfortable clothing and clothing appropriate for easy access to any Portacath or PICC line.   We strive to give you quality time with your provider. You may need to reschedule your appointment if you arrive late (15 or more minutes).  Arriving late affects you and other patients whose appointments are after yours.  Also, if you miss three or more appointments without notifying the office, you may be dismissed from the clinic at the provider's discretion.      For prescription refill requests, have your pharmacy contact our office and allow 72 hours for refills to be completed.    Today you received the following chemotherapy and/or immunotherapy agents: bevacizumab , paclitaxel , and carboplatin       To help prevent nausea and vomiting after your treatment, we encourage you to take your nausea medication as directed.  BELOW ARE SYMPTOMS THAT SHOULD BE REPORTED IMMEDIATELY: *FEVER GREATER THAN 100.4 F (38 C) OR HIGHER *CHILLS OR SWEATING *NAUSEA AND VOMITING THAT IS NOT CONTROLLED WITH YOUR NAUSEA MEDICATION *UNUSUAL SHORTNESS OF BREATH *UNUSUAL BRUISING OR BLEEDING *URINARY PROBLEMS (pain or burning when urinating, or frequent urination) *BOWEL PROBLEMS (unusual diarrhea, constipation, pain near the anus) TENDERNESS IN MOUTH AND THROAT WITH OR WITHOUT PRESENCE OF ULCERS (sore throat, sores in mouth, or a toothache) UNUSUAL RASH, SWELLING OR PAIN  UNUSUAL VAGINAL DISCHARGE OR ITCHING   Items with * indicate a potential emergency and should be followed up as soon as possible or go to the Emergency Department if any problems should occur.  Please show the  CHEMOTHERAPY ALERT CARD or IMMUNOTHERAPY ALERT CARD at check-in to the Emergency Department and triage nurse.  Should you have questions after your visit or need to cancel or reschedule your appointment, please contact [facility] - A DEPT OF [facility]  Dept: [phone]  and follow the prompts.  Office hours are 8:00 a.m. to 4:30 p.m. Monday - Friday. Please note that voicemails left after 4:00 p.m. may not be returned until the following business day.  We are closed weekends and major holidays. You have access to a nurse at all times for urgent questions. Please call the main number to the clinic Dept: [phone] and follow the prompts.   For any non-urgent questions, you may also contact your provider using MyChart. We now offer e-Visits for anyone 21 and older to request care online for non-urgent symptoms. For details visit [email or web address].   Also download the MyChart app! Go to the app store, search MyChart, open the app, select , and log in with your MyChart username and password.

## 2024-05-03 NOTE — Progress Notes (Signed)
 Continue carbo 450mg  today despite improvement in Scr as pt's Scr has fluctuated intermittently in the past per Dr. Lonn.   [place], PharmD, MBA

## 2024-05-04 ENCOUNTER — Other Ambulatory Visit: Payer: Self-pay | Admitting: Hematology and Oncology

## 2024-05-04 ENCOUNTER — Ambulatory Visit: Payer: Self-pay | Admitting: Hematology and Oncology

## 2024-05-04 DIAGNOSIS — N390 Urinary tract infection, site not specified: Secondary | ICD-10-CM | POA: Insufficient documentation

## 2024-05-04 LAB — CA 125: Cancer Antigen (CA) 125: 150 U/mL — ABNORMAL HIGH (ref 0.0–38.1)

## 2024-05-04 MED ORDER — CIPROFLOXACIN HCL 250 MG PO TABS: 250.0000 mg | ORAL_TABLET | Freq: Two times a day (BID) | ORAL | 0 refills | Status: DC

## 2024-05-04 NOTE — Telephone Encounter (Signed)
-----   Message from Almarie Bedford sent at 05/04/2024  1:24 PM EDT ----- I sent antibiotics to [facility], please call her to let her know she probably has UTI ----- Message ----- From: Rebecka, Lab In [place] Sent: 05/03/2024  12:29 PM EDT To: Almarie Bedford, MD

## 2024-05-04 NOTE — Telephone Encounter (Signed)
 Patient informed of urine culture results. Informed patient of cipro  rx sent in to the [facility] and reviewed how to properly take medication.  Patient states that she is out of town today, but will return tomorrow and will pick-up the prescription then.

## 2024-05-06 LAB — URINE CULTURE: Culture: 10000 — AB

## 2024-05-23 MED FILL — Fosaprepitant Dimeglumine For IV Infusion 150 MG (Base Eq): INTRAVENOUS | Qty: 5 | Status: AC

## 2024-05-24 ENCOUNTER — Encounter: Payer: Self-pay | Admitting: Hematology and Oncology

## 2024-05-24 ENCOUNTER — Inpatient Hospital Stay

## 2024-05-24 ENCOUNTER — Inpatient Hospital Stay: Admitting: Hematology and Oncology

## 2024-05-24 VITALS — BP 136/78 | HR 115 | Temp 97.7°F | Resp 18 | Ht 62.0 in | Wt 164.6 lb

## 2024-05-24 VITALS — BP 133/77 | HR 99 | Temp 98.0°F | Resp 16

## 2024-05-24 DIAGNOSIS — Z5112 Encounter for antineoplastic immunotherapy: Secondary | ICD-10-CM | POA: Diagnosis not present

## 2024-05-24 DIAGNOSIS — R000 Tachycardia, unspecified: Secondary | ICD-10-CM | POA: Diagnosis not present

## 2024-05-24 DIAGNOSIS — Z79630 Long term (current) use of alkylating agent: Secondary | ICD-10-CM | POA: Diagnosis not present

## 2024-05-24 DIAGNOSIS — C482 Malignant neoplasm of peritoneum, unspecified: Secondary | ICD-10-CM | POA: Diagnosis not present

## 2024-05-24 DIAGNOSIS — R809 Proteinuria, unspecified: Secondary | ICD-10-CM

## 2024-05-24 DIAGNOSIS — R300 Dysuria: Secondary | ICD-10-CM | POA: Diagnosis not present

## 2024-05-24 DIAGNOSIS — Z1501 Genetic susceptibility to malignant neoplasm of breast: Secondary | ICD-10-CM | POA: Diagnosis not present

## 2024-05-24 DIAGNOSIS — Z79899 Other long term (current) drug therapy: Secondary | ICD-10-CM | POA: Diagnosis not present

## 2024-05-24 DIAGNOSIS — T451X5A Adverse effect of antineoplastic and immunosuppressive drugs, initial encounter: Secondary | ICD-10-CM | POA: Diagnosis not present

## 2024-05-24 DIAGNOSIS — Z79633 Long term (current) use of mitotic inhibitor: Secondary | ICD-10-CM | POA: Diagnosis not present

## 2024-05-24 DIAGNOSIS — D649 Anemia, unspecified: Secondary | ICD-10-CM | POA: Diagnosis not present

## 2024-05-24 DIAGNOSIS — Z170 Estrogen receptor positive status [ER+]: Secondary | ICD-10-CM | POA: Diagnosis not present

## 2024-05-24 DIAGNOSIS — G620 Drug-induced polyneuropathy: Secondary | ICD-10-CM | POA: Diagnosis not present

## 2024-05-24 DIAGNOSIS — D6481 Anemia due to antineoplastic chemotherapy: Secondary | ICD-10-CM | POA: Diagnosis not present

## 2024-05-24 DIAGNOSIS — Z1509 Genetic susceptibility to other malignant neoplasm: Secondary | ICD-10-CM | POA: Diagnosis not present

## 2024-05-24 DIAGNOSIS — R188 Other ascites: Secondary | ICD-10-CM | POA: Diagnosis not present

## 2024-05-24 DIAGNOSIS — Z5111 Encounter for antineoplastic chemotherapy: Secondary | ICD-10-CM | POA: Diagnosis not present

## 2024-05-24 LAB — CBC WITH DIFFERENTIAL (CANCER CENTER ONLY)
Abs Immature Granulocytes: 0.05 K/uL (ref 0.00–0.07)
Basophils Absolute: 0 K/uL (ref 0.0–0.1)
Basophils Relative: 0 %
Eosinophils Absolute: 0 K/uL (ref 0.0–0.5)
Eosinophils Relative: 0 %
HCT: 31.7 % — ABNORMAL LOW (ref 36.0–46.0)
Hemoglobin: 10.6 g/dL — ABNORMAL LOW (ref 12.0–15.0)
Immature Granulocytes: 1 %
Lymphocytes Relative: 20 %
Lymphs Abs: 1.5 K/uL (ref 0.7–4.0)
MCH: 31.3 pg (ref 26.0–34.0)
MCHC: 33.4 g/dL (ref 30.0–36.0)
MCV: 93.5 fL (ref 80.0–100.0)
Monocytes Absolute: 0.1 K/uL (ref 0.1–1.0)
Monocytes Relative: 1 %
Neutro Abs: 5.9 K/uL (ref 1.7–7.7)
Neutrophils Relative %: 78 %
Platelet Count: 310 K/uL (ref 150–400)
RBC: 3.39 MIL/uL — ABNORMAL LOW (ref 3.87–5.11)
RDW: 19.7 % — ABNORMAL HIGH (ref 11.5–15.5)
WBC Count: 7.5 K/uL (ref 4.0–10.5)
nRBC: 0 % (ref 0.0–0.2)

## 2024-05-24 LAB — CMP (CANCER CENTER ONLY)
ALT: 15 U/L (ref 0–44)
AST: 16 U/L (ref 15–41)
Albumin: 3.6 g/dL (ref 3.5–5.0)
Alkaline Phosphatase: 84 U/L (ref 38–126)
Anion gap: 10 (ref 5–15)
BUN: 18 mg/dL (ref 6–20)
CO2: 24 mmol/L (ref 22–32)
Calcium: 8.8 mg/dL — ABNORMAL LOW (ref 8.9–10.3)
Chloride: 103 mmol/L (ref 98–111)
Creatinine: 0.94 mg/dL (ref 0.44–1.00)
GFR, Estimated: >60 mL/min (ref >60)
Glucose, Bld: 221 mg/dL — ABNORMAL HIGH (ref 70–99)
Potassium: 4.1 mmol/L (ref 3.5–5.1)
Sodium: 137 mmol/L (ref 135–145)
Total Bilirubin: 0.2 mg/dL (ref 0.0–1.2)
Total Protein: 8 g/dL (ref 6.5–8.1)

## 2024-05-24 LAB — TOTAL PROTEIN, URINE DIPSTICK: Protein, ur: 300 mg/dL — AB

## 2024-05-24 MED ORDER — SODIUM CHLORIDE 0.9% FLUSH
10.0000 mL | INTRAVENOUS | Status: DC | PRN
  Administered 2024-05-24: 10 mL

## 2024-05-24 MED ORDER — SODIUM CHLORIDE 0.9% FLUSH
10.0000 mL | Freq: Once | INTRAVENOUS | Status: AC
  Administered 2024-05-24: 10 mL

## 2024-05-24 MED ORDER — SODIUM CHLORIDE 0.9 % IV SOLN: INTRAVENOUS | Status: DC

## 2024-05-24 MED ORDER — SODIUM CHLORIDE 0.9 % IV SOLN
450.0000 mg | Freq: Once | INTRAVENOUS | Status: AC
  Administered 2024-05-24: 450 mg via INTRAVENOUS
  Filled 2024-05-24: qty 45

## 2024-05-24 MED ORDER — HEPARIN SOD (PORK) LOCK FLUSH 100 UNIT/ML IV SOLN
500.0000 [IU] | Freq: Once | INTRAVENOUS | Status: AC | PRN
Start: 2024-05-24 — End: 2024-05-24
  Administered 2024-05-24: 500 [IU]

## 2024-05-24 MED ORDER — CETIRIZINE HCL 10 MG/ML IV SOLN
10.0000 mg | Freq: Once | INTRAVENOUS | Status: AC
  Administered 2024-05-24: 10 mg via INTRAVENOUS
  Filled 2024-05-24: qty 1

## 2024-05-24 MED ORDER — PALONOSETRON HCL INJECTION 0.25 MG/5ML
0.2500 mg | Freq: Once | INTRAVENOUS | Status: AC
  Administered 2024-05-24: 0.25 mg via INTRAVENOUS
  Filled 2024-05-24: qty 5

## 2024-05-24 MED ORDER — DEXAMETHASONE SODIUM PHOSPHATE 10 MG/ML IJ SOLN
10.0000 mg | Freq: Once | INTRAMUSCULAR | Status: AC
  Administered 2024-05-24: 10 mg via INTRAVENOUS
  Filled 2024-05-24: qty 1

## 2024-05-24 MED ORDER — FAMOTIDINE IN NACL 20-0.9 MG/50ML-% IV SOLN
20.0000 mg | Freq: Once | INTRAVENOUS | Status: AC
  Administered 2024-05-24: 20 mg via INTRAVENOUS
  Filled 2024-05-24: qty 50

## 2024-05-24 MED ORDER — FOSAPREPITANT DIMEGLUMINE INJECTION 150 MG
150.0000 mg | Freq: Once | INTRAVENOUS | Status: AC
  Administered 2024-05-24: 150 mg via INTRAVENOUS
  Filled 2024-05-24: qty 150

## 2024-05-24 MED ORDER — SODIUM CHLORIDE 0.9 % IV SOLN
131.2500 mg/m2 | Freq: Once | INTRAVENOUS | Status: AC
  Administered 2024-05-24: 240 mg via INTRAVENOUS
  Filled 2024-05-24: qty 40

## 2024-05-24 NOTE — Assessment & Plan Note (Addendum)
 She has remote history of Hodgkin lymphoma and breast cancer She was diagnosed with stage III primary peritoneal cancer in 2023 treated with neoadjuvant chemotherapy followed by surgery and completion of chemotherapy by March 2024 Pathology high-grade serous carcinoma, ER positive, p53 mutated, BRCA2 positive CARIS molecular testing, HRD positive, BRCA to positive, PD-L1 CPS score 5%, p53 mutation positive, MSI stable, low tumor mutation burden, BRAF negative, ER 50% positive, folate receptor 1 negative, 2+, 35%, HER2/neu 0  She is doing well on maintenance olaparib  with minimal side effects. Unfortunately, recent imaging study and tumor marker from June 2025 showed disease progression/recurrence She resumed chemotherapy with carboplatin , paclitaxel  and bevacizumab  She required paracentesis x 1 but since then, she has lost a lot of weight and her ascites has never returned  The patient is mildly tachycardic and she is noted to have very high urine protein We discussed the rationale behind omission of bevacizumab  today and importance of checking her blood pressure We will proceed with chemotherapy as scheduled without bevacizumab  today I will plan to repeat imaging study after today's treatment

## 2024-05-24 NOTE — Progress Notes (Signed)
 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[facility]) She has remote history of Hodgkin lymphoma and breast cancer She was diagnosed with stage III primary peritoneal cancer in 2023 treated with neoadjuvant chemotherapy followed by surgery and completion of chemotherapy by March 2024 Pathology high-grade serous carcinoma, ER positive, p53 mutated, BRCA2 positive CARIS molecular testing, HRD positive, BRCA to positive, PD-L1 CPS score 5%, p53 mutation positive, MSI stable, low tumor mutation burden, BRAF negative, ER 50% positive, folate receptor 1 negative, 2+, 35%, HER2/neu 0  She is doing well on maintenance olaparib  with minimal side effects. Unfortunately, recent imaging study and tumor marker from June 2025 showed disease progression/recurrence She resumed chemotherapy with carboplatin , paclitaxel  and bevacizumab  She required paracentesis x 1 but since then, she has lost a lot of weight and her ascites has never returned  The patient is mildly tachycardic and she is noted to have very high urine protein We discussed the rationale behind omission of bevacizumab  today and importance of checking her blood pressure We will proceed with chemotherapy as scheduled without bevacizumab  today I will plan to repeat imaging study after today's treatment Anemia due to antineoplastic chemotherapy She has stable anemia with hemoglobin around 10-11 I believe this is her baseline Observe only Proteinuria, unspecified type She has heavy proteinuria She is not diabetic She has no UTI symptoms She has not been consistently checking her blood pressure on a regular basis and we discussed importance of aggressive blood pressure management We will omit  bevacizumab  today I recommend the patient to contact me if her systolic blood pressure is more than 140 Peripheral neuropathy due to chemotherapy (HCC) She had prior neuropathy from chemo This is stable Will proceed with similar dose reduction as before  Orders Placed This Encounter  Procedures   CT ABDOMEN PELVIS W CONTRAST    Standing Status:   Future    Expected Date:   06/07/2024    Expiration Date:   05/24/2025    Scheduling Instructions:     No need oral contrast    If indicated for the ordered procedure, I authorize the administration of contrast media per Radiology protocol:   Yes    Does the patient have a contrast media/X-ray dye allergy?:   No    Is patient pregnant?:   No    Preferred imaging location?:   [facility]    If indicated for the ordered procedure, I authorize the administration of oral contrast media per Radiology protocol:   No    Reason for no oral contrast::   No need oral contrast   Total Protein, Urine dipstick    Standing Status:   Future    Expected Date:   06/14/2024    Expiration Date:   05/24/2025     Almarie Bedford, MD  INTERVAL HISTORY: she returns for treatment follow-up Complications related to previous cycle of chemotherapy included anemia,, peripheral neuropathy,, and heavy proteinuria She denies worsening peripheral neuropathy She denies recurrent bloating sensation, nausea or discomfort in her abdomen I reviewed molecular test results with the patient, rationale behind omission of bevacizumab  and timing of her next imaging  PHYSICAL EXAMINATION: ECOG PERFORMANCE STATUS: 1 - Symptomatic but completely ambulatory  Lab Results  Component Value Date   CAN125 150.0 (H) 05/03/2024  CAN125 355.0 (H) 03/27/2024   CAN125 27.6 01/19/2024      Latest Ref Rng & Units 05/24/2024    8:38 AM 05/03/2024   10:14 AM 04/05/2024    9:29 AM  CBC  WBC 4.0 - 10.5 K/uL 7.5  12.0  10.9   Hemoglobin 12.0 - 15.0 g/dL 89.3  89.3  88.3   Hematocrit  36.0 - 46.0 % 31.7  31.0  34.0   Platelets 150 - 400 K/uL 310  477  540       Chemistry      Component Value Date/Time   NA 137 05/24/2024 0838   K 4.1 05/24/2024 0838   CL 103 05/24/2024 0838   CO2 24 05/24/2024 0838   BUN 18 05/24/2024 0838   CREATININE 0.94 05/24/2024 0838      Component Value Date/Time   CALCIUM  8.8 (L) 05/24/2024 0838   ALKPHOS 84 05/24/2024 0838   AST 16 05/24/2024 0838   ALT 15 05/24/2024 0838   BILITOT 0.2 05/24/2024 0838       Vitals:   05/24/24 0901  BP: 136/78  Pulse: (!) 115  Resp: 18  Temp: 97.7 F (36.5 C)  SpO2: 96%   Filed Weights   05/24/24 0901  Weight: 164 lb 9.6 oz (74.7 kg)   Other relevant data reviewed during this visit included CBC, CMP, CA125

## 2024-05-24 NOTE — Assessment & Plan Note (Addendum)
 She has stable anemia with hemoglobin around 10-11 I believe this is her baseline Observe only

## 2024-05-24 NOTE — Assessment & Plan Note (Addendum)
 She had prior neuropathy from chemo This is stable Will proceed with similar dose reduction as before

## 2024-05-24 NOTE — Progress Notes (Signed)
 Per Dr. Lonn, carboplatin  dose to be 450 mg today.   Alfonso MARLA Buys, PharmD Pharmacy Resident  05/24/2024 10:20 AM

## 2024-05-24 NOTE — Assessment & Plan Note (Addendum)
 She has heavy proteinuria She is not diabetic She has no UTI symptoms She has not been consistently checking her blood pressure on a regular basis and we discussed importance of aggressive blood pressure management We will omit bevacizumab  today I recommend the patient to contact me if her systolic blood pressure is more than 140

## 2024-06-07 ENCOUNTER — Ambulatory Visit (HOSPITAL_COMMUNITY)
Admission: RE | Admit: 2024-06-07 | Discharge: 2024-06-07 | Disposition: A | Discharge status: Home / Self Care | Source: Ambulatory Visit | Attending: Hematology and Oncology | Admitting: Hematology and Oncology

## 2024-06-07 DIAGNOSIS — N3289 Other specified disorders of bladder: Secondary | ICD-10-CM | POA: Diagnosis not present

## 2024-06-07 DIAGNOSIS — C482 Malignant neoplasm of peritoneum, unspecified: Secondary | ICD-10-CM | POA: Diagnosis not present

## 2024-06-07 DIAGNOSIS — R188 Other ascites: Secondary | ICD-10-CM | POA: Diagnosis not present

## 2024-06-07 MED ORDER — IOHEXOL 300 MG/ML  SOLN
100.0000 mL | Freq: Once | INTRAMUSCULAR | Status: AC | PRN
  Administered 2024-06-07: 100 mL via INTRAVENOUS

## 2024-06-07 MED ORDER — HEPARIN SOD (PORK) LOCK FLUSH 100 UNIT/ML IV SOLN
500.0000 [IU] | Freq: Once | INTRAVENOUS | Status: AC
  Administered 2024-06-07: 500 [IU] via INTRAVENOUS

## 2024-06-07 MED ORDER — HEPARIN SOD (PORK) LOCK FLUSH 100 UNIT/ML IV SOLN
INTRAVENOUS | Status: AC
  Filled 2024-06-07: qty 5

## 2024-06-07 MED ORDER — IOHEXOL 9 MG/ML PO SOLN
1000.0000 mL | ORAL | Status: AC
  Administered 2024-06-07: 1000 mL via ORAL

## 2024-06-13 MED FILL — Fosaprepitant Dimeglumine For IV Infusion 150 MG (Base Eq): INTRAVENOUS | Qty: 5 | Status: AC

## 2024-06-14 ENCOUNTER — Encounter: Payer: Self-pay | Admitting: Hematology and Oncology

## 2024-06-14 ENCOUNTER — Inpatient Hospital Stay

## 2024-06-14 ENCOUNTER — Inpatient Hospital Stay: Attending: Gynecologic Oncology

## 2024-06-14 ENCOUNTER — Inpatient Hospital Stay (HOSPITAL_BASED_OUTPATIENT_CLINIC_OR_DEPARTMENT_OTHER): Admitting: Hematology and Oncology

## 2024-06-14 VITALS — BP 132/68 | HR 120 | Temp 97.7°F | Resp 18 | Ht 62.0 in | Wt 163.2 lb

## 2024-06-14 DIAGNOSIS — R000 Tachycardia, unspecified: Secondary | ICD-10-CM | POA: Diagnosis not present

## 2024-06-14 DIAGNOSIS — R809 Proteinuria, unspecified: Secondary | ICD-10-CM | POA: Insufficient documentation

## 2024-06-14 DIAGNOSIS — C482 Malignant neoplasm of peritoneum, unspecified: Secondary | ICD-10-CM | POA: Diagnosis not present

## 2024-06-14 DIAGNOSIS — Z9221 Personal history of antineoplastic chemotherapy: Secondary | ICD-10-CM | POA: Insufficient documentation

## 2024-06-14 DIAGNOSIS — D6481 Anemia due to antineoplastic chemotherapy: Secondary | ICD-10-CM | POA: Insufficient documentation

## 2024-06-14 DIAGNOSIS — G620 Drug-induced polyneuropathy: Secondary | ICD-10-CM | POA: Diagnosis not present

## 2024-06-14 DIAGNOSIS — T451X5A Adverse effect of antineoplastic and immunosuppressive drugs, initial encounter: Secondary | ICD-10-CM

## 2024-06-14 DIAGNOSIS — R971 Elevated cancer antigen 125 [CA 125]: Secondary | ICD-10-CM | POA: Diagnosis not present

## 2024-06-14 DIAGNOSIS — Z5111 Encounter for antineoplastic chemotherapy: Secondary | ICD-10-CM | POA: Diagnosis not present

## 2024-06-14 LAB — CMP (CANCER CENTER ONLY)
ALT: 15 U/L (ref 0–44)
AST: 16 U/L (ref 15–41)
Albumin: 3.9 g/dL (ref 3.5–5.0)
Alkaline Phosphatase: 86 U/L (ref 38–126)
Anion gap: 10 (ref 5–15)
BUN: 23 mg/dL — ABNORMAL HIGH (ref 6–20)
CO2: 23 mmol/L (ref 22–32)
Calcium: 9 mg/dL (ref 8.9–10.3)
Chloride: 104 mmol/L (ref 98–111)
Creatinine: 1.03 mg/dL — ABNORMAL HIGH (ref 0.44–1.00)
GFR, Estimated: >60 mL/min (ref >60)
Glucose, Bld: 144 mg/dL — ABNORMAL HIGH (ref 70–99)
Potassium: 4.2 mmol/L (ref 3.5–5.1)
Sodium: 137 mmol/L (ref 135–145)
Total Bilirubin: 0.2 mg/dL (ref 0.0–1.2)
Total Protein: 8.2 g/dL — ABNORMAL HIGH (ref 6.5–8.1)

## 2024-06-14 LAB — CBC WITH DIFFERENTIAL (CANCER CENTER ONLY)
Abs Immature Granulocytes: 0.04 K/uL (ref 0.00–0.07)
Basophils Absolute: 0 K/uL (ref 0.0–0.1)
Basophils Relative: 0 %
Eosinophils Absolute: 0 K/uL (ref 0.0–0.5)
Eosinophils Relative: 0 %
HCT: 29.4 % — ABNORMAL LOW (ref 36.0–46.0)
Hemoglobin: 10 g/dL — ABNORMAL LOW (ref 12.0–15.0)
Immature Granulocytes: 1 %
Lymphocytes Relative: 18 %
Lymphs Abs: 1.5 K/uL (ref 0.7–4.0)
MCH: 31.7 pg (ref 26.0–34.0)
MCHC: 34 g/dL (ref 30.0–36.0)
MCV: 93.3 fL (ref 80.0–100.0)
Monocytes Absolute: 0.4 K/uL (ref 0.1–1.0)
Monocytes Relative: 4 %
Neutro Abs: 6.3 K/uL (ref 1.7–7.7)
Neutrophils Relative %: 77 %
Platelet Count: 355 K/uL (ref 150–400)
RBC: 3.15 MIL/uL — ABNORMAL LOW (ref 3.87–5.11)
RDW: 21.2 % — ABNORMAL HIGH (ref 11.5–15.5)
WBC Count: 8.2 K/uL (ref 4.0–10.5)
nRBC: 0 % (ref 0.0–0.2)

## 2024-06-14 LAB — TOTAL PROTEIN, URINE DIPSTICK: Protein, ur: 100 mg/dL — AB

## 2024-06-14 MED ORDER — DEXAMETHASONE SODIUM PHOSPHATE 10 MG/ML IJ SOLN
10.0000 mg | Freq: Once | INTRAMUSCULAR | Status: AC
  Administered 2024-06-14: 10 mg via INTRAVENOUS
  Filled 2024-06-14: qty 1

## 2024-06-14 MED ORDER — PALONOSETRON HCL INJECTION 0.25 MG/5ML
0.2500 mg | Freq: Once | INTRAVENOUS | Status: AC
  Administered 2024-06-14: 0.25 mg via INTRAVENOUS
  Filled 2024-06-14: qty 5

## 2024-06-14 MED ORDER — SODIUM CHLORIDE 0.9% FLUSH
10.0000 mL | INTRAVENOUS | Status: DC | PRN
  Administered 2024-06-14: 10 mL

## 2024-06-14 MED ORDER — SODIUM CHLORIDE 0.9 % IV SOLN
131.2500 mg/m2 | Freq: Once | INTRAVENOUS | Status: AC
  Administered 2024-06-14: 240 mg via INTRAVENOUS
  Filled 2024-06-14: qty 40

## 2024-06-14 MED ORDER — FAMOTIDINE IN NACL 20-0.9 MG/50ML-% IV SOLN
20.0000 mg | Freq: Once | INTRAVENOUS | Status: AC
  Administered 2024-06-14: 20 mg via INTRAVENOUS
  Filled 2024-06-14: qty 50

## 2024-06-14 MED ORDER — SODIUM CHLORIDE 0.9 % IV SOLN
150.0000 mg | Freq: Once | INTRAVENOUS | Status: AC
  Administered 2024-06-14: 150 mg via INTRAVENOUS
  Filled 2024-06-14: qty 150

## 2024-06-14 MED ORDER — SODIUM CHLORIDE 0.9% FLUSH
10.0000 mL | Freq: Once | INTRAVENOUS | Status: AC | PRN
  Administered 2024-06-14: 10 mL

## 2024-06-14 MED ORDER — SODIUM CHLORIDE 0.9 % IV SOLN: INTRAVENOUS | Status: DC

## 2024-06-14 MED ORDER — CETIRIZINE HCL 10 MG/ML IV SOLN
10.0000 mg | Freq: Once | INTRAVENOUS | Status: AC
  Administered 2024-06-14: 10 mg via INTRAVENOUS
  Filled 2024-06-14: qty 1

## 2024-06-14 MED ORDER — SODIUM CHLORIDE 0.9 % IV SOLN
476.0000 mg | Freq: Once | INTRAVENOUS | Status: AC
  Administered 2024-06-14: 480 mg via INTRAVENOUS
  Filled 2024-06-14: qty 48

## 2024-06-14 NOTE — Assessment & Plan Note (Addendum)
 She had prior neuropathy from chemo This is stable Will proceed with similar dose reduction as before

## 2024-06-14 NOTE — Assessment & Plan Note (Addendum)
 Proteinuria is likely improved She is not diabetic She has no UTI symptoms We will omit bevacizumab  today I recommend the patient to contact me if her systolic blood pressure is more than 140

## 2024-06-14 NOTE — Assessment & Plan Note (Addendum)
 She has stable anemia with hemoglobin around 10-11 I believe this is her baseline Observe only

## 2024-06-14 NOTE — Patient Instructions (Signed)
 [facility] - A DEPT OF [facility]  Discharge Instructions: Thank you for choosing [facility] to provide your oncology and hematology care.   If you have a lab appointment with the Cancer Center, please go directly to the Cancer Center and check in at the registration area.   Wear comfortable clothing and clothing appropriate for easy access to any Portacath or PICC line.   We strive to give you quality time with your provider. You may need to reschedule your appointment if you arrive late (15 or more minutes).  Arriving late affects you and other patients whose appointments are after yours.  Also, if you miss three or more appointments without notifying the office, you may be dismissed from the clinic at the provider's discretion.      For prescription refill requests, have your pharmacy contact our office and allow 72 hours for refills to be completed.    Today you received the following chemotherapy and/or immunotherapy agents: Paclitaxel & Carboplatin      To help prevent nausea and vomiting after your treatment, we encourage you to take your nausea medication as directed.  BELOW ARE SYMPTOMS THAT SHOULD BE REPORTED IMMEDIATELY: *FEVER GREATER THAN 100.4 F (38 C) OR HIGHER *CHILLS OR SWEATING *NAUSEA AND VOMITING THAT IS NOT CONTROLLED WITH YOUR NAUSEA MEDICATION *UNUSUAL SHORTNESS OF BREATH *UNUSUAL BRUISING OR BLEEDING *URINARY PROBLEMS (pain or burning when urinating, or frequent urination) *BOWEL PROBLEMS (unusual diarrhea, constipation, pain near the anus) TENDERNESS IN MOUTH AND THROAT WITH OR WITHOUT PRESENCE OF ULCERS (sore throat, sores in mouth, or a toothache) UNUSUAL RASH, SWELLING OR PAIN  UNUSUAL VAGINAL DISCHARGE OR ITCHING   Items with * indicate a potential emergency and should be followed up as soon as possible or go to the Emergency Department if any problems should occur.  Please show the CHEMOTHERAPY ALERT CARD or  IMMUNOTHERAPY ALERT CARD at check-in to the Emergency Department and triage nurse.  Should you have questions after your visit or need to cancel or reschedule your appointment, please contact [facility] - A DEPT OF [facility]  Dept: [phone]  and follow the prompts.  Office hours are 8:00 a.m. to 4:30 p.m. Monday - Friday. Please note that voicemails left after 4:00 p.m. may not be returned until the following business day.  We are closed weekends and major holidays. You have access to a nurse at all times for urgent questions. Please call the main number to the clinic Dept: [phone] and follow the prompts.   For any non-urgent questions, you may also contact your provider using MyChart. We now offer e-Visits for anyone 79 and older to request care online for non-urgent symptoms. For details visit [email or web address].   Also download the MyChart app! Go to the app store, search "MyChart", open the app, select [place], and log in with your MyChart username and password.

## 2024-06-14 NOTE — Progress Notes (Signed)
 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[facility]) She has remote history of Hodgkin lymphoma and breast cancer She was diagnosed with stage III primary peritoneal cancer in 2023 treated with neoadjuvant chemotherapy followed by surgery and completion of chemotherapy by March 2024 Pathology high-grade serous carcinoma, ER positive, p53 mutated, BRCA2 positive CARIS molecular testing, HRD positive, BRCA to positive, PD-L1 CPS score 5%, p53 mutation positive, MSI stable, low tumor mutation burden, BRAF negative, ER 50% positive, folate receptor 1 negative, 2+, 35%, HER2/neu 0 She progressed on olaparib  maintenance treatment  I reviewed CT imaging from August 2025 in comparison with CT imaging from June which showed positive response to therapy The plan will be to continue treatment for another 3 cycles before repeating imaging study again at the end of October The patient is mildly tachycardic and she is noted to have very high urine protein We discussed the rationale behind omission of bevacizumab  today  We will proceed with chemotherapy as scheduled without bevacizumab  today In the future, I plan to reduce the dose of bevacizumab  to 10 mg/kg if we are able to resume next month  Peripheral neuropathy due to chemotherapy [facility]) She had prior neuropathy from chemo This is stable Will proceed with similar dose reduction as before Anemia due to antineoplastic chemotherapy She has stable anemia with hemoglobin around 10-11 I believe this is her baseline Observe only Proteinuria, unspecified type Proteinuria is likely improved She is not diabetic She has no UTI symptoms We will omit bevacizumab  today I recommend the patient to contact me if  her systolic blood pressure is more than 140  Orders Placed This Encounter  Procedures   CA 125    Standing Status:   Standing    Number of Occurrences:   11    Expiration Date:   06/14/2025   Total Protein, Urine dipstick    Standing Status:   Future    Expected Date:   07/05/2024    Expiration Date:   06/14/2025     Almarie Bedford, MD  INTERVAL HISTORY: she returns for treatment follow-up Complications related to previous cycle of chemotherapy included anemia,, peripheral neuropathy,, and tachycardia and proteinuria She denies worsening peripheral neuropathy Overall, she tolerated recent treatment well Her blood pressure at home were generally within normal limits I reviewed blood work and imaging studies and discussed future plan of care  PHYSICAL EXAMINATION: ECOG PERFORMANCE STATUS: 1 - Symptomatic but completely ambulatory  Lab Results  Component Value Date   CAN125 150.0 (H) 05/03/2024   CAN125 355.0 (H) 03/27/2024   CAN125 27.6 01/19/2024      Latest Ref Rng & Units 06/14/2024    8:24 AM 05/24/2024    8:38 AM 05/03/2024   10:14 AM  CBC  WBC 4.0 - 10.5 K/uL 8.2  7.5  12.0   Hemoglobin 12.0 - 15.0 g/dL 89.9  89.3  89.3   Hematocrit 36.0 - 46.0 % 29.4  31.7  31.0   Platelets 150 - 400 K/uL 355  310  477       Chemistry      Component Value Date/Time   NA 137 06/14/2024 0824   K 4.2 06/14/2024 0824   CL 104 06/14/2024 0824   CO2 23 06/14/2024 0824   BUN 23 (H) 06/14/2024 9175  CREATININE 1.03 (H) 06/14/2024 0824      Component Value Date/Time   CALCIUM  9.0 06/14/2024 0824   ALKPHOS 86 06/14/2024 0824   AST 16 06/14/2024 0824   ALT 15 06/14/2024 0824   BILITOT 0.2 06/14/2024 0824       Vitals:   06/14/24 0858  BP: 132/68  Pulse: (!) 120  Resp: 18  Temp: 97.7 F (36.5 C)  SpO2: 97%   Filed Weights   06/14/24 0858  Weight: 163 lb 3.2 oz (74 kg)   Other relevant data reviewed during this visit included CBC, CMP, CA125, CT imaging from June 2025 and  August 2025

## 2024-06-14 NOTE — Assessment & Plan Note (Addendum)
 She has remote history of Hodgkin lymphoma and breast cancer She was diagnosed with stage III primary peritoneal cancer in 2023 treated with neoadjuvant chemotherapy followed by surgery and completion of chemotherapy by March 2024 Pathology high-grade serous carcinoma, ER positive, p53 mutated, BRCA2 positive CARIS molecular testing, HRD positive, BRCA to positive, PD-L1 CPS score 5%, p53 mutation positive, MSI stable, low tumor mutation burden, BRAF negative, ER 50% positive, folate receptor 1 negative, 2+, 35%, HER2/neu 0 She progressed on olaparib  maintenance treatment  I reviewed CT imaging from August 2025 in comparison with CT imaging from June which showed positive response to therapy The plan will be to continue treatment for another 3 cycles before repeating imaging study again at the end of October The patient is mildly tachycardic and she is noted to have very high urine protein We discussed the rationale behind omission of bevacizumab  today  We will proceed with chemotherapy as scheduled without bevacizumab  today In the future, I plan to reduce the dose of bevacizumab  to 10 mg/kg if we are able to resume next month

## 2024-06-15 ENCOUNTER — Other Ambulatory Visit: Payer: Self-pay

## 2024-06-22 ENCOUNTER — Inpatient Hospital Stay: Admitting: Gynecologic Oncology

## 2024-06-22 ENCOUNTER — Encounter: Payer: Self-pay | Admitting: Gynecologic Oncology

## 2024-06-22 VITALS — BP 128/67 | HR 80 | Temp 98.1°F | Resp 19 | Wt 164.0 lb

## 2024-06-22 DIAGNOSIS — Z5111 Encounter for antineoplastic chemotherapy: Secondary | ICD-10-CM | POA: Diagnosis not present

## 2024-06-22 DIAGNOSIS — R971 Elevated cancer antigen 125 [CA 125]: Secondary | ICD-10-CM | POA: Diagnosis not present

## 2024-06-22 DIAGNOSIS — C482 Malignant neoplasm of peritoneum, unspecified: Secondary | ICD-10-CM | POA: Diagnosis not present

## 2024-06-22 DIAGNOSIS — Z9221 Personal history of antineoplastic chemotherapy: Secondary | ICD-10-CM | POA: Diagnosis not present

## 2024-06-22 DIAGNOSIS — R809 Proteinuria, unspecified: Secondary | ICD-10-CM | POA: Diagnosis not present

## 2024-06-22 DIAGNOSIS — R000 Tachycardia, unspecified: Secondary | ICD-10-CM | POA: Diagnosis not present

## 2024-06-22 DIAGNOSIS — G620 Drug-induced polyneuropathy: Secondary | ICD-10-CM | POA: Diagnosis not present

## 2024-06-22 DIAGNOSIS — C786 Secondary malignant neoplasm of retroperitoneum and peritoneum: Secondary | ICD-10-CM

## 2024-06-22 DIAGNOSIS — K432 Incisional hernia without obstruction or gangrene: Secondary | ICD-10-CM

## 2024-06-22 DIAGNOSIS — D6481 Anemia due to antineoplastic chemotherapy: Secondary | ICD-10-CM | POA: Diagnosis not present

## 2024-06-22 NOTE — Patient Instructions (Addendum)
 It was good to see you today.    I will see you for follow-up in 3-4 months.  As always, if you develop any new and concerning symptoms before your next visit, please call to see me sooner.

## 2024-06-22 NOTE — Progress Notes (Addendum)
 Gynecologic Oncology Return Clinic Visit  06/22/24  Reason for Visit: follow-up  Treatment History: Oncology History Overview Note  BRCA2 positive   Primary peritoneal adenocarcinoma (HCC)  07/19/2022 Imaging   1. Small left pleural effusion  2. 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HCC)   07/23/2022 Cancer Staging   Staging form: Ovary, Fallopian Tube, and Primary Peritoneal Carcinoma, AJCC 8th Edition - Clinical stage from 07/23/2022: FIGO Stage IIIC (cT3c, cN0, cM0) - Signed by Lonn Hicks, MD on 07/23/2022 Stage prefix: Initial diagnosis   07/23/2022 Pathology Results   FINAL MICROSCOPIC DIAGNOSIS:  - Malignant cells present  - See comment   SPECIMEN ADEQUACY:  Satisfactory for evaluation   DIAGNOSTIC COMMENTS:  Immunohistochemical stains show that the tumor cells are positive for ER, PAX8, p16 and p53 (clonal overexpression pattern), consistent with a high-grade serous carcinoma.  Immunostain for WT1 is also diffusely positive in the tumor cells, most consistent with an ovarian primary.  Dr. LeGolvan reviewed the case and concurs with the above diagnosis.    07/30/2022 Procedure   Successful right chest port placement via the right internal jugular vein. The port is ready for immediate use.   08/02/2022 - 12/28/2022 Chemotherapy   Patient is on Treatment Plan : OVARIAN Carboplatin  (AUC 6) + Paclitaxel  (175) q21d X 6 Cycles     08/03/2022 Procedure   Successful ultrasound-guided paracentesis yielding 4.1 liters of peritoneal fluid.   08/09/2022 Tumor Marker   Patient's tumor was tested for the following markers: CA-125. Results of the tumor marker test revealed 452.   08/12/2022 Echocardiogram    1. Left ventricular ejection fraction, by estimation, is 40 to 45%. The left ventricle has mildly decreased function. The left ventricle demonstrates global hypokinesis. Left ventricular  diastolic parameters are indeterminate.   2. Right ventricular systolic function is normal. The right ventricular size is normal.   3. The mitral valve is normal in structure. Mild mitral valve regurgitation. No evidence of mitral stenosis.   4. The aortic valve is normal in structure. Aortic valve regurgitation is mild to moderate. Aortic valve sclerosis/calcification is present, without any evidence of aortic stenosis.   5. The inferior vena cava is normal in size with greater than 50% respiratory variability, suggesting right atrial pressure of 3 mmHg.    09/23/2022 Tumor Marker   Patient's tumor was tested for the following markers: CA-125. Results of the tumor marker test revealed 41.3.   10/07/2022 Imaging   1. Compared to the outside abdominopelvic CT of 07/19/2022, significant improvement in peritoneal carcinomatosis, without bowel obstruction or other acute complication. 2. Decreased size of upper abdominal nodes, possibly also representing response to therapy. 3. No new or progressive disease and no evidence of thoracic metastasis. 4. Bladder wall thickening and mucosal hyperenhancement are suspicious for cystitis. 5. 4 mm left upper lobe pulmonary nodule is most likely benign/incidental but can be re-evaluated at follow-up. 6. Aortic valvular calcifications. Consider echocardiography to evaluate for valvular dysfunction. 7. Coronary artery atherosclerosis. Aortic Atherosclerosis (ICD10-I70.0).     11/11/2022 Surgery   Exploratory laparotomy, lysis of adhesions for approximately 45 minutes, total omentectomy with radical tumor debulking including resection of perigastric nodule, excision and fulguration of multiple small bowel mesenteric nodules, resection of multiple sigmoid epiploica, fulguration of peritoneal lesions along bilateral pelvic sidewalls, peritoneal stripping of bladder peritoneum   Findings: On bimanual exam, no masses appreciated at the vaginal cuff, no nodularity.   On intra-abdominal entry,  infracolic omentum retracted and involved by residual tumor implant measuring approximately 10 x 4 cm.  Nodularity palpated almost up to the splenic flexure.  Supracolic omentum involved to within 2 cm of the greater curvature of the stomach.  Additional 2 x 3 cm implant noted lateral and inferior to the gastric pylorus (sitting anterior to the duodenum and just inferior to the gallbladder).  Ascending and transverse colon normal in appearance.  Small bowel run from the ileocecal valve to the ligament of Treitz.  Rare small nodules within the mesentery noted.  These were all either excised or fulgurated.  Right aspect of the liver and diaphragm smooth to palpation, no visible lesions.  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  No ascites.  Within the pelvis, minimal nodularity seen along bilateral pelvic sidewalls near the infundibulopelvic remnants, suspected to be related to prior hysterectomy and BSO.  Several sigmoid epiploica with tumor implants versus treated tumor, all excised fulgurated.  No nodularity within the deep pelvis although some evidence of what appeared to be treated tumor versus tumor rind involving the bladder peritoneum was stripped in this location.  Biopsies taken from the posterior cul-de-sac. R0 resection at the end of surgery.   11/11/2022 Pathology Results   A. SMALL BOWEL MESENTARY NODULES: -  Densely fibrotic soft tissue/scar with abundant foamy histiocytes, negative for malignancy.  B. 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  C. PERIGASTRIC IMPLANTS, EXCISION: -  High-grade serous carcinoma.  D. SIGMOID EPIPLOICA, EXCISION: High-grade serous carcinoma in the background of dense  stromal fibrosis/scar, calcifications and foamy histiocytes.  E. BLADDER PERITONEUM, EXCISION: -  High-grade serous carcinoma.  F. CUL DE SAC, POSTERIOR, BIOPSY: -  Focal high-grade serous carcinoma in the background of predominantly broad and foamy histiocytes.  Note: It is noted that the patient had a diagnosis of high-grade serous carcinoma from a cytology of the ascites in September 2023 and was clinically staged as cT3c.  Patient is now status post neoadjuvant chemotherapy.  It is noted that this is a debulking surgery.  No additional testing is performed to reserve tissue for clinician directed testing.    12/08/2022 Tumor Marker   Patient's tumor was tested for the following markers: CA-125. Results of the tumor marker test revealed 10.2.   02/03/2023 Imaging   1. Compared to 10/06/2022, interval omentectomy and peritoneal stripping. No residual omental thickening/nodularity. Scattered areas of mesenteric stranding may be postsurgical. 2. No new metastatic disease in the abdomen or pelvis. 3. Unchanged 5 mm enhancing focus along the anterior pancreatic body/tail compared to 07/19/2022. Differential includes intrapancreatic spleen or neoplasm, including small neuroendocrine tumor. Given the size of this lesion, evaluation by MRI would likely be suboptimal. 4. Aortic Atherosclerosis (ICD10-I70.0). Coronary artery calcifications. Assessment for potential risk factor modification, dietary therapy or pharmacologic therapy may be warranted, if clinically indicated.   02/03/2023 Tumor Marker   Patient's tumor was tested for the following markers: CA-125. Results of the tumor marker test revealed 7.8.   02/14/2023 -  Chemotherapy   She started taking olaparib    03/10/2023 Tumor Marker   Patient's tumor was tested for the following markers: CA-125. Results of the tumor marker test revealed 7.6.   04/08/2023 Tumor Marker   Patient's tumor was tested for the following markers:  CA-125. Results of the tumor marker test revealed  8.8.   06/08/2023 Tumor Marker   Patient's tumor was tested for the following markers: CA-125. Results of the tumor marker test revealed 9.4.   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 * Tracking Code: BO * EXAM: CT CHEST, ABDOMEN, AND PELVIS WITH CONTRAST TECHNIQUE: Multidetector CT imaging of the chest, abdomen and pelvis was performed following the standard protocol during bolus administration of intravenous contrast. RADIATION DOSE REDUCTION: This exam was performed according to the departmental dose-optimization program which includes automated exposure control, adjustment of the mA and/or kV according to patient size and/or use of iterative reconstruction technique. CONTRAST:  OMNIPAQUE  IOHEXOL  300 MG/ML  SOLN COMPARISON:  Multiple priors including CT October 06, 2022 and February 01, 2023. FINDINGS: CT CHEST FINDINGS Cardiovascular: Accessed right chest Port-A-Cath with tip near the superior cavoatrial junction. Aortic atherosclerosis. Normal size heart. No significant pericardial effusion/thickening. Calcifications of the aortic valve and annulus. Coronary artery calcifications. Mediastinum/Nodes: No suspicious thyroid  nodule. Calcified mediastinal lymph nodes. No pathologically enlarged mediastinal, hilar or axillary lymph nodes. Retained versus refluxed contrast in a patulous esophagus. Lungs/Pleura: Biapical pleuroparenchymal scarring. Scattered areas of scarring versus atelectasis. Stable 4 mm left upper lobe pulmonary nodule on image 35/4. No new suspicious pulmonary nodules or masses. Musculoskeletal: Bilateral breast prostheses. No aggressive lytic or blastic lesion of bone. Multilevel degenerative change of the spine. CT ABDOMEN PELVIS FINDINGS Hepatobiliary:  No suspicious hepatic lesion. Gallbladder is unremarkable. No biliary ductal dilation. Pancreas: No pancreatic ductal dilation or evidence of acute inflammation. Hyperenhancing focus along the anterior margin of the pancreas measuring 5 mm on image 53/2 is unchanged. Spleen: Prior splenectomy. Adrenals/Urinary Tract: Bilateral adrenal glands appear normal. No hydronephrosis. Kidneys demonstrate symmetric enhancement. Urinary bladder is unremarkable for degree of distension. Stomach/Bowel: Radiopaque enteric contrast material traverses distal loops of small bowel. Stomach is unremarkable for degree of distension. No pathologic dilation of small or large bowel. Colonic diverticulosis without findings of acute diverticulitis. Vascular/Lymphatic: Aortic atherosclerosis. Normal caliber abdominal aorta. Smooth IVC contours. The portal, splenic and superior mesenteric veins are patent. No pathologically enlarged abdominal or pelvic lymph nodes. Reproductive: Prior hysterectomy and salpingo oophorectomy without suspicious nodularity along the vaginal cuff or in the adnexa. Other: Prior omentectomy and peritoneal stripping. Similar minimal mesenteric/peritoneal stranding. Similar pelvic peritoneal thickening for instance on image 100/2. No free fluid. No new discrete peritoneal or omental nodularity. Postsurgical change in the anterior abdominal wall. Small fat containing peri-incisional hernia. Musculoskeletal: No aggressive lytic or blastic lesion of bone. IMPRESSION: 1. Prior hysterectomy and salpingo-oophorectomy without evidence of local recurrence. 2. Prior omentectomy and peritoneal stripping with similar minimal mesenteric/peritoneal stranding and pelvic peritoneal thickening, likely postsurgical. No new discrete peritoneal or omental nodularity. 3. Stable 4 mm left upper lobe pulmonary nodule. No new suspicious pulmonary nodules or masses. 4. No evidence of new or progressive disease in the chest, abdomen or pelvis.  5. Stable 5 mm hyperenhancing focus along the anterior margin of the pancreas, differential includes intrapancreatic spleen versus neoplasm including small neuroendocrine tumor. Given small size MRI would likely be of limited utility. Suggest continued attention on follow-up imaging. 6.  Aortic Atherosclerosis (ICD10-I70.0). Electronically Signed   By: Reyes Holder M.D.   On: 08/02/2023 14:56      08/03/2023 Tumor Marker   Patient's tumor was tested for the following markers: CA-125. Results of the tumor marker  test revealed 8.   09/28/2023 Tumor Marker   Patient's tumor was tested for the following markers: CA-125. Results of the tumor marker test revealed 7.9.   11/23/2023 Tumor Marker   Patient's tumor was tested for the following markers: CA-125. Results of the tumor marker test revealed 8.5.   01/24/2024 Tumor Marker   Patient's tumor was tested for the following markers: CA-125. Results of the tumor marker test revealed 27.6.   01/31/2024 Imaging   Received phone call to discuss imaging results with Dr. Comer Dollar on 02/16/2024 at 2:05 pm. Exam dated 01/31/2024 was compared with available prior imaging dated 08/02/2023. There is interval development of new peritoneal thickening and nodularity. For example, diffuse peritoneal thickening is seen within the lower abdomen/pelvis (2:69) and multiple peritoneal nodules within the midline anterior upper abdomen, deep to the surgical incision measuring 8 mm (2:24), splenic flexure measuring 11 mm (2:12), left paracolic gutter measuring up to 1.0 cm (2: 29, 34, 40, 59), and right lower quadrant measuring 1.8 cm (2:59).     03/27/2024 Imaging   CT ABDOMEN PELVIS W CONTRAST Result Date: 03/27/2024 CLINICAL DATA:  History of ovarian cancer, monitor. * Tracking Code: BO * EXAM: CT ABDOMEN AND PELVIS WITH CONTRAST TECHNIQUE: Multidetector CT imaging of the abdomen and pelvis was performed using the standard protocol following bolus  administration of intravenous contrast. RADIATION DOSE REDUCTION: This exam was performed according to the departmental dose-optimization program which includes automated exposure control, adjustment of the mA and/or kV according to patient size and/or use of iterative reconstruction technique. CONTRAST:  OMNIPAQUE  IOHEXOL  300 MG/ML  SOLN COMPARISON:  Multiple priors including CT January 31, 2024 FINDINGS: Lower chest: No acute abnormality. Partially visualized bilateral breast prostheses. Hepatobiliary: No suspicious hepatic lesion. Noninflamed gallbladder. No biliary ductal dilation. Pancreas: No pancreatic ductal dilation or evidence of acute inflammation. Spleen: Spleen is surgically absent. Adrenals/Urinary Tract: No suspicious adrenal nodule/mass. No hydronephrosis. Kidneys demonstrate symmetric enhancement. Urinary bladder is unremarkable for degree of distension. Stomach/Bowel: Stomach is unremarkable for degree of distension. No pathologic dilation of small or large bowel. Vascular/Lymphatic: Aortic atherosclerosis. No pathologically enlarged abdominal or pelvic lymph nodes. Reproductive: Uterus is surgically absent. Other: Progressive nodular peritoneal thickening with increased peritoneal/omental nodularity and new small volume ascites. For reference: -left upper quadrant nodule measures 15 mm on image 14/2 previously 11 mm -anterior abdominal nodule measures 13 mm on image 27/2 previously 8 mm. Right ventral hernia contains to markedly thickened peritoneum with some free fluid. Musculoskeletal: No aggressive lytic or blastic lesion of bone. Multilevel degenerative changes spine. IMPRESSION: 1. Progressive nodular peritoneal thickening with increased peritoneal/omental nodularity and new small volume ascites, consistent with worsening peritoneal carcinomatosis. 2. Right ventral hernia contains markedly thickened peritoneum with some free fluid. 3.  Aortic Atherosclerosis (ICD10-I70.0). Electronically  Signed   By: Reyes Holder M.D.   On: 03/27/2024 14:44      03/28/2024 Tumor Marker   Patient's tumor was tested for the following markers: CA-125. Results of the tumor marker test revealed 355.   04/09/2024 Procedure   Successful ultrasound-guided therapeutic paracentesis yielding 5 L of peritoneal fluid.   04/12/2024 - 04/12/2024 Chemotherapy   Patient is on Treatment Plan : OVARIAN Bevacizumab  (15) D1 + Gemcitabine D1,8 + Carboplatin  D1 q21d / Bevacizumab  (15) D1     04/12/2024 -  Chemotherapy   Patient is on Treatment Plan : OVARIAN Carboplatin  + Paclitaxel  + Bevacizumab  q21d      05/04/2024 Tumor Marker   Patient's tumor  was tested for the following markers: CA-125. Results of the tumor marker test revealed 150.   06/07/2024 Imaging   CT ABDOMEN PELVIS W CONTRAST Result Date: 06/13/2024 CLINICAL DATA:  Ovarian cancer, monitor for response to chemotherapy. * Tracking Code: BO * EXAM: CT ABDOMEN AND PELVIS WITH CONTRAST TECHNIQUE: Multidetector CT imaging of the abdomen and pelvis was performed using the standard protocol following bolus administration of intravenous contrast. RADIATION DOSE REDUCTION: This exam was performed according to the departmental dose-optimization program which includes automated exposure control, adjustment of the mA and/or kV according to patient size and/or use of iterative reconstruction technique. CONTRAST:  OMNIPAQUE  IOHEXOL  300 MG/ML  SOLN COMPARISON:  Multiple priors including CT March 27, 2024 FINDINGS: Lower chest: Patulous distal esophagus with mild symmetric esophageal wall thickening. Partially visualized bilateral breast prostheses. Hepatobiliary: Subcentimeter hypodensity in the dome of the liver on image 8/2 measuring 3 mm along the posterior right lobe of the liver measuring 3 mm on image 18/2 are new from prior examination, technically too small to accurately characterize. Gallbladder is unremarkable. No biliary ductal dilation. Pancreas: No  pancreatic ductal dilation or evidence of acute inflammation. Spleen: Spleen is surgically absent. Adrenals/Urinary Tract: No suspicious adrenal nodule/mass. Hydronephrosis. Kidneys demonstrate symmetric enhancement. Mild symmetric wall thickening of the urinary bladder with urothelial hyperenhancement. Stomach/Bowel: Radiopaque enteric contrast material traverses the cecum. Stomach is unremarkable for degree of distension. No pathologic dilation of small or large bowel. Colonic diverticulosis. Vascular/Lymphatic: Aortic atherosclerosis. Smooth IVC contours. The portal, splenic and superior mesenteric veins are patent. No pathologically enlarged abdominal or pelvic lymph nodes. Reproductive: Uterus is surgically absent. Other: Decreased now trace ascites. Decreased peritoneal/omental nodularity. For reference: -nodule in the left upper quadrant measures 9 mm on image 14/2 previously 15 mm -nodule in the anterior abdomen subjacent to the peritoneal lining measures 6 mm on image 28/2 previously 13 mm. -nodular focus along the C-loop of the duodenum in the gallbladder fossa measures 2.2 x 1.2 cm on image 23/2 previously 3.5 x 1.8 cm. Small ventral hernia contains fluid fat and nonobstructed portion of bowel. Musculoskeletal: No aggressive lytic or blastic lesion of bone. Multilevel degenerative change of the spine. IMPRESSION: 1. Decreased peritoneal/omental nodularity and decreased now trace ascites. 2. New subcentimeter hypodensities in the dome of the liver and posterior right lobe of the liver are technically too small to accurately characterize but suspicious for metastatic disease. Consider follow-up abdominal MRI in 1-2 months with and without contrast versus attention on follow-up scheduled oncologic imaging. 3. Mild symmetric wall thickening of the urinary bladder with subtle urothelial hyperenhancement, correlate with urinalysis to exclude cystitis. 4. Patulous distal esophagus with mild symmetric esophageal  wall thickening, correlate for symptoms of esophagitis. 5.  Aortic Atherosclerosis (ICD10-I70.0). Electronically Signed   By: Reyes Holder M.D.   On: 06/13/2024 15:40       C4 Carbo/taxol /bev on 8/21 (Avastin  held last 2 cycles).  Interval History: Overall doing well.  Notes that she has felt much better after recent cycle than she did with her previous 1.  She has been better about staying hydrated.  She is using Senokot for constipation.  Past Medical/Surgical History: Past Medical History:  Diagnosis Date   Anemia    BRCA2 gene mutation positive 01/08/2019   Breast cancer (HCC) 2007   Left   GERD (gastroesophageal reflux disease)    Hodgkin's disease (HCC) 10/26/1987   clinical   Hypothyroidism    Malignant tumor of breast (HCC)    left  Pneumonia    Thyroid  dysfunction    2020, as results on Hodgkin's radiation    Past Surgical History:  Procedure Laterality Date   BREAST RECONSTRUCTION     2009   COLONOSCOPY     2017   DEBULKING N/A 11/11/2022   Procedure: TUMOR DEBULKING;  Surgeon: Viktoria Comer SAUNDERS, MD;  Location: [facility];  Service: Gynecology;  Laterality: N/A;   excisional of bilateral breast     2007   Hysteroscopy w.biopsy     2012   IR IMAGING GUIDED PORT INSERTION  07/29/2022   IR PARACENTESIS  07/23/2022   KNEE ARTHROSCOPY     1985   laparoscopically assisted vaginal hysterectomy w/removal of tubes and/or ovaries  04/23/2019   LYMPH NODE BIOPSY  1989   MASTECTOMY Bilateral 2007   OMENTECTOMY N/A 11/11/2022   Procedure: OPEN OMENTECTOMY, PERITONEAL STRIPPING,  LYSIS OF ADHESIONS;  Surgeon: Viktoria Comer SAUNDERS, MD;  Location: [facility];  Service: Gynecology;  Laterality: N/A;   removal of spleen total     1989   RIGHT/LEFT HEART CATH AND CORONARY ANGIOGRAPHY N/A 10/28/2022   Procedure: RIGHT/LEFT HEART CATH AND CORONARY ANGIOGRAPHY;  Surgeon: [name], MD;  Location: [facility];  Service: Cardiovascular;  Laterality: N/A;    Family History   Problem Relation Age of Onset   Prostate cancer Father    Prostate cancer Brother    Colon cancer Maternal Grandmother    Breast cancer Paternal Grandmother    Ovarian cancer Neg Hx    Endometrial cancer Neg Hx    Pancreatic cancer Neg Hx     Social History   Socioeconomic History   Marital status: Married    Spouse name: Not on file   Number of children: Not on file   Years of education: Not on file   Highest education level: Not on file  Occupational History   Occupation: retired  Tobacco Use   Smoking status: Never   Smokeless tobacco: Never  Vaping Use   Vaping status: Never Used  Substance and Sexual Activity   Alcohol  use: Yes    Comment: rare   Drug use: Never   Sexual activity: Yes  Other Topics Concern   Not on file  Social History Narrative   Not on file   Social Drivers of Health   Financial Resource Strain: Low Risk  (08/02/2022)   Overall Financial Resource Strain (CARDIA)    Difficulty of Paying Living Expenses: Not very hard  Food Insecurity: No Food Insecurity (11/11/2022)   Hunger Vital Sign    Worried About Running Out of Food in the Last Year: Never true    Ran Out of Food in the Last Year: Never true  Transportation Needs: No Transportation Needs (11/11/2022)   PRAPARE - Administrator, Civil Service (Medical): No    Lack of Transportation (Non-Medical): No  Physical Activity: Not on file  Stress: Not on file  Social Connections: Unknown (07/19/2022)   Received from [facility]   Social Network    Social Network: Not on file    Current Medications:  Current Outpatient Medications:    aspirin  81 MG chewable tablet, Chew 81 mg by mouth daily., Disp: , Rfl:    Cholecalciferol (VITAMIN D -3) 125 MCG (5000 UT) TABS, Take by mouth., Disp: , Rfl:    dexamethasone  (DECADRON ) 4 MG tablet, Take 2 tabs at the night before and 2 tab the morning of chemotherapy, every 3 weeks, by mouth x  6 cycles, Disp: 24 tablet, Rfl: 6   levothyroxine   (SYNTHROID ) 125 MCG tablet, Take 125 mcg by mouth daily before breakfast., Disp: , Rfl:    lidocaine -prilocaine  (EMLA ) cream, Apply 1 Application topically as needed., Disp: 30 g, Rfl: 3   ondansetron  (ZOFRAN ) 8 MG tablet, Take 1 tablet (8 mg total) by mouth every 8 (eight) hours as needed for nausea or vomiting. Start on the third day after carboplatin ., Disp: 30 tablet, Rfl: 1   oxyCODONE  (OXY IR/ROXICODONE ) 5 MG immediate release tablet, Take 1 tablet (5 mg total) by mouth every 4 (four) hours as needed for severe pain (pain score 7-10)., Disp: 30 tablet, Rfl: 0   pantoprazole  (PROTONIX ) 40 MG tablet, Take 40 mg by mouth daily before breakfast., Disp: , Rfl:    prochlorperazine  (COMPAZINE ) 10 MG tablet, Take 1 tablet (10 mg total) by mouth every 6 (six) hours as needed for nausea or vomiting., Disp: 30 tablet, Rfl: 1   rosuvastatin  (CRESTOR ) 5 MG tablet, Take 1 tablet (5 mg total) by mouth daily., Disp: 90 tablet, Rfl: 3   vitamin B-12 (CYANOCOBALAMIN ) 500 MCG tablet, Take 500 mcg by mouth every evening., Disp: , Rfl:   Review of Systems: + Shortness of breath after chemotherapy, abdominal pain, constipation, numbness in fingertips and toes. Denies appetite changes, fevers, chills, fatigue, unexplained weight changes. Denies hearing loss, neck lumps or masses, mouth sores, ringing in ears or voice changes. Denies cough or wheezing.  Denies chest pain or palpitations. Denies leg swelling. Denies abdominal distention, blood in stools, diarrhea, nausea, vomiting, or early satiety. Denies pain with intercourse, dysuria, frequency, hematuria or incontinence. Denies hot flashes, pelvic pain, vaginal bleeding or vaginal discharge.   Denies joint pain, back pain or muscle pain/cramps. Denies itching, rash, or wounds. Denies dizziness, headaches or seizures. Denies swollen lymph nodes or glands, denies easy bruising or bleeding. Denies anxiety, depression, confusion, or decreased  concentration.  Physical Exam: BP 128/67 (BP Location: Right Arm, Patient Position: Sitting)   Pulse 80   Temp 98.1 F (36.7 C) (Oral)   Resp 19   Wt 164 lb (74.4 kg)   SpO2 99%   BMI 30.00 kg/m  General: Alert, oriented, no acute distress. HEENT: Posterior oropharynx clear, sclera anicteric. Chest: Unlabored breathing on room air. Abdomen: soft, nontender.  Normoactive bowel sounds.  No masses or hepatosplenomegaly appreciated.  4 cm peri-incisional hernia along the ventral abdomen, nontender. Extremities: Grossly normal range of motion.  Warm, well perfused.  No edema bilaterally.  Laboratory & Radiologic Studies: CT A/P: 06/07/24 1. Decreased peritoneal/omental nodularity and decreased now trace ascites. 2. New subcentimeter hypodensities in the dome of the liver and posterior right lobe of the liver are technically too small to accurately characterize but suspicious for metastatic disease. Consider follow-up abdominal MRI in 1-2 months with and without contrast versus attention on follow-up scheduled oncologic imaging. 3. Mild symmetric wall thickening of the urinary bladder with subtle urothelial hyperenhancement, correlate with urinalysis to exclude cystitis. 4. Patulous distal esophagus with mild symmetric esophageal wall thickening, correlate for symptoms of esophagitis. 5.  Aortic Atherosclerosis (ICD10-I70.0).  Assessment & Plan: Lauren Chandler is a 58 y.o. 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 Was on Lynparza  for maintenance in the setting of BRCA2 mutation. Uptrending CA125 starting in March 2025 with recurrence confirmed on imaging in 03/2024.  Now on platinum  doublet with a Avastin  for treatment of platinum sensitive recurrence.   The patient is doing well.  Feeling significantly better with most  recent cycle of chemotherapy than she had been previously.  Discussed importance of continued hydration, working to maintain soft and regular bowel movements.   We discussed recent CT results.  In reviewing her imaging, I think that hypodensities along the dome of the liver where there previously but a little bit easier to see on recent imaging.  Given plan for repeat CT scan after cycle 6, I agree with deferring MRI evaluation at this time.   Hernia continues to be evident on exam as well as imaging.  She is basically asymptomatic.    Reviewed recent somatic testing which showed BRCA2 mutation, HRD positive, PD-L1 CPS 5%, and p53 mutation.  HER2 negative, FOLR1 35%.   I will see her back for follow-up in 4 months.  22 minutes of total time was spent for this patient encounter, including preparation, face-to-face counseling with the patient and coordination of care, and documentation of the encounter.  Comer Dollar, MD  Division of Gynecologic Oncology  Department of Obstetrics and Gynecology  [facility]

## 2024-07-04 MED FILL — Fosaprepitant Dimeglumine For IV Infusion 150 MG (Base Eq): INTRAVENOUS | Qty: 5 | Status: AC

## 2024-07-05 ENCOUNTER — Inpatient Hospital Stay: Attending: Gynecologic Oncology

## 2024-07-05 ENCOUNTER — Encounter: Payer: Self-pay | Admitting: Hematology and Oncology

## 2024-07-05 ENCOUNTER — Inpatient Hospital Stay (HOSPITAL_BASED_OUTPATIENT_CLINIC_OR_DEPARTMENT_OTHER): Admitting: Hematology and Oncology

## 2024-07-05 ENCOUNTER — Inpatient Hospital Stay

## 2024-07-05 VITALS — BP 128/83 | HR 108 | Temp 98.2°F | Resp 18

## 2024-07-05 DIAGNOSIS — G620 Drug-induced polyneuropathy: Secondary | ICD-10-CM | POA: Insufficient documentation

## 2024-07-05 DIAGNOSIS — D6481 Anemia due to antineoplastic chemotherapy: Secondary | ICD-10-CM

## 2024-07-05 DIAGNOSIS — Z5111 Encounter for antineoplastic chemotherapy: Secondary | ICD-10-CM | POA: Diagnosis not present

## 2024-07-05 DIAGNOSIS — R809 Proteinuria, unspecified: Secondary | ICD-10-CM | POA: Diagnosis not present

## 2024-07-05 DIAGNOSIS — Z8571 Personal history of Hodgkin lymphoma: Secondary | ICD-10-CM | POA: Insufficient documentation

## 2024-07-05 DIAGNOSIS — C482 Malignant neoplasm of peritoneum, unspecified: Secondary | ICD-10-CM | POA: Insufficient documentation

## 2024-07-05 DIAGNOSIS — T451X5A Adverse effect of antineoplastic and immunosuppressive drugs, initial encounter: Secondary | ICD-10-CM | POA: Diagnosis not present

## 2024-07-05 DIAGNOSIS — Z853 Personal history of malignant neoplasm of breast: Secondary | ICD-10-CM | POA: Insufficient documentation

## 2024-07-05 DIAGNOSIS — W1800XA Striking against unspecified object with subsequent fall, initial encounter: Secondary | ICD-10-CM | POA: Insufficient documentation

## 2024-07-05 LAB — CBC WITH DIFFERENTIAL (CANCER CENTER ONLY)
Abs Immature Granulocytes: 0.02 K/uL (ref 0.00–0.07)
Basophils Absolute: 0 K/uL (ref 0.0–0.1)
Basophils Relative: 0 %
Eosinophils Absolute: 0 K/uL (ref 0.0–0.5)
Eosinophils Relative: 0 %
HCT: 27.8 % — ABNORMAL LOW (ref 36.0–46.0)
Hemoglobin: 9.2 g/dL — ABNORMAL LOW (ref 12.0–15.0)
Immature Granulocytes: 0 %
Lymphocytes Relative: 18 %
Lymphs Abs: 1.2 K/uL (ref 0.7–4.0)
MCH: 31.3 pg (ref 26.0–34.0)
MCHC: 33.1 g/dL (ref 30.0–36.0)
MCV: 94.6 fL (ref 80.0–100.0)
Monocytes Absolute: 0.4 K/uL (ref 0.1–1.0)
Monocytes Relative: 6 %
Neutro Abs: 5 K/uL (ref 1.7–7.7)
Neutrophils Relative %: 76 %
Platelet Count: 241 K/uL (ref 150–400)
RBC: 2.94 MIL/uL — ABNORMAL LOW (ref 3.87–5.11)
RDW: 22.6 % — ABNORMAL HIGH (ref 11.5–15.5)
WBC Count: 6.6 K/uL (ref 4.0–10.5)
nRBC: 0.5 % — ABNORMAL HIGH (ref 0.0–0.2)

## 2024-07-05 LAB — CMP (CANCER CENTER ONLY)
ALT: 22 U/L (ref 0–44)
AST: 21 U/L (ref 15–41)
Albumin: 4 g/dL (ref 3.5–5.0)
Alkaline Phosphatase: 88 U/L (ref 38–126)
Anion gap: 9 (ref 5–15)
BUN: 23 mg/dL — ABNORMAL HIGH (ref 6–20)
CO2: 27 mmol/L (ref 22–32)
Calcium: 9.3 mg/dL (ref 8.9–10.3)
Chloride: 103 mmol/L (ref 98–111)
Creatinine: 1.07 mg/dL — ABNORMAL HIGH (ref 0.44–1.00)
GFR, Estimated: >60 mL/min (ref >60)
Glucose, Bld: 121 mg/dL — ABNORMAL HIGH (ref 70–99)
Potassium: 4.4 mmol/L (ref 3.5–5.1)
Sodium: 139 mmol/L (ref 135–145)
Total Bilirubin: 0.2 mg/dL (ref 0.0–1.2)
Total Protein: 8.8 g/dL — ABNORMAL HIGH (ref 6.5–8.1)

## 2024-07-05 LAB — TOTAL PROTEIN, URINE DIPSTICK: Protein, ur: 100 mg/dL — AB

## 2024-07-05 MED ORDER — ALTEPLASE 2 MG IJ SOLR
2.0000 mg | Freq: Once | INTRAMUSCULAR | Status: AC
  Administered 2024-07-05: 2 mg
  Filled 2024-07-05: qty 2

## 2024-07-05 MED ORDER — FAMOTIDINE IN NACL 20-0.9 MG/50ML-% IV SOLN
20.0000 mg | Freq: Once | INTRAVENOUS | Status: AC
  Administered 2024-07-05: 20 mg via INTRAVENOUS
  Filled 2024-07-05: qty 50

## 2024-07-05 MED ORDER — SODIUM CHLORIDE 0.9 % IV SOLN
150.0000 mg | Freq: Once | INTRAVENOUS | Status: AC
  Administered 2024-07-05: 150 mg via INTRAVENOUS
  Filled 2024-07-05: qty 150

## 2024-07-05 MED ORDER — SODIUM CHLORIDE 0.9 % IV SOLN: INTRAVENOUS | Status: DC

## 2024-07-05 MED ORDER — SODIUM CHLORIDE 0.9 % IV SOLN
131.2500 mg/m2 | Freq: Once | INTRAVENOUS | Status: AC
  Administered 2024-07-05: 240 mg via INTRAVENOUS
  Filled 2024-07-05: qty 40

## 2024-07-05 MED ORDER — CETIRIZINE HCL 10 MG/ML IV SOLN
10.0000 mg | Freq: Once | INTRAVENOUS | Status: AC
  Administered 2024-07-05: 10 mg via INTRAVENOUS
  Filled 2024-07-05: qty 1

## 2024-07-05 MED ORDER — PALONOSETRON HCL INJECTION 0.25 MG/5ML
0.2500 mg | Freq: Once | INTRAVENOUS | Status: AC
  Administered 2024-07-05: 0.25 mg via INTRAVENOUS
  Filled 2024-07-05: qty 5

## 2024-07-05 MED ORDER — DEXAMETHASONE SODIUM PHOSPHATE 10 MG/ML IJ SOLN
10.0000 mg | Freq: Once | INTRAMUSCULAR | Status: AC
  Administered 2024-07-05: 10 mg via INTRAVENOUS
  Filled 2024-07-05: qty 1

## 2024-07-05 MED ORDER — SODIUM CHLORIDE 0.9 % IV SOLN
463.0000 mg | Freq: Once | INTRAVENOUS | Status: AC
  Administered 2024-07-05: 460 mg via INTRAVENOUS
  Filled 2024-07-05: qty 46

## 2024-07-05 NOTE — Assessment & Plan Note (Addendum)
 She had prior neuropathy from chemo This is stable Will proceed with similar dose reduction as before

## 2024-07-05 NOTE — Progress Notes (Signed)
 [name] OFFICE PROGRESS NOTE  Patient Care Team: [name], FNP as PCP - General (Internal Medicine) [name], MD as PCP - Cardiology (Cardiology) [name], MD as Consulting Physician (Gastroenterology)  Assessment & Plan Primary peritoneal adenocarcinoma [facility]) She has remote history of Hodgkin lymphoma and breast cancer She was diagnosed with stage III primary peritoneal cancer in 2023 treated with neoadjuvant chemotherapy followed by surgery and completion of chemotherapy by March 2024 Pathology high-grade serous carcinoma, ER positive, p53 mutated, BRCA2 positive CARIS molecular testing, HRD positive, BRCA to positive, PD-L1 CPS score 5%, p53 mutation positive, MSI stable, low tumor mutation burden, BRAF negative, ER 50% positive, folate receptor 1 negative, 2+, 35%, HER2/neu 0 She progressed on olaparib  maintenance treatment  I reviewed CT imaging from August 2025 in comparison with CT imaging from June which showed positive response to therapy The plan will be to continue treatment for another 3 cycles before repeating imaging study again at the end of October She tolerated treatment well except for anemia, slight peripheral neuropathy and proteinuria We will proceed with chemotherapy as scheduled without bevacizumab  today In the future, I plan to reduce the dose of bevacizumab  to 10 mg/kg if we are able to resume next month  Peripheral neuropathy due to chemotherapy [facility]) She had prior neuropathy from chemo This is stable Will proceed with similar dose reduction as before Proteinuria, unspecified type Proteinuria is stable but not better yet She is not diabetic She has no UTI symptoms We will omit bevacizumab  today Anemia due to antineoplastic chemotherapy She has stable anemia  I believe this is her baseline Observe only Fall against object She denies major injury We discussed strategies for fall prevention and importance of adequate  oral fluid intake  No orders of the defined types were placed in this encounter.    Lauren Bedford, MD  INTERVAL HISTORY: she returns for treatment follow-up Complications related to previous cycle of chemotherapy included anemia,, peripheral neuropathy,, and fall at home and proteinuria She fell at home in her bathroom 2-1/2 weeks ago after she got up from her toilet What she describes seems to be postural dizziness She chipped her tooth and caused some pain in her lip and bruised her chest wall She had very mild peripheral neuropathy from chemo but stable overall  PHYSICAL EXAMINATION: ECOG PERFORMANCE STATUS: 1 - Symptomatic but completely ambulatory  Lab Results  Component Value Date   CAN125 150.0 (H) 05/03/2024   CAN125 355.0 (H) 03/27/2024   CAN125 27.6 01/19/2024      Latest Ref Rng & Units 07/05/2024   11:59 AM 06/14/2024    8:24 AM 05/24/2024    8:38 AM  CBC  WBC 4.0 - 10.5 K/uL 6.6  8.2  7.5   Hemoglobin 12.0 - 15.0 g/dL 9.2  89.9  89.3   Hematocrit 36.0 - 46.0 % 27.8  29.4  31.7   Platelets 150 - 400 K/uL 241  355  310       Chemistry      Component Value Date/Time   NA 139 07/05/2024 1159   K 4.4 07/05/2024 1159   CL 103 07/05/2024 1159   CO2 27 07/05/2024 1159   BUN 23 (H) 07/05/2024 1159   CREATININE 1.07 (H) 07/05/2024 1159      Component Value Date/Time   CALCIUM  9.3 07/05/2024 1159   ALKPHOS 88 07/05/2024 1159   AST 21 07/05/2024 1159   ALT 22 07/05/2024 1159   BILITOT 0.2 07/05/2024  1159       There were no vitals filed for this visit. There were no vitals filed for this visit. Other relevant data reviewed during this visit included CBC, CMP, serum urine protein

## 2024-07-05 NOTE — Assessment & Plan Note (Addendum)
 Proteinuria is stable but not better yet She is not diabetic She has no UTI symptoms We will omit bevacizumab  today

## 2024-07-05 NOTE — Assessment & Plan Note (Addendum)
 She has remote history of Hodgkin lymphoma and breast cancer She was diagnosed with stage III primary peritoneal cancer in 2023 treated with neoadjuvant chemotherapy followed by surgery and completion of chemotherapy by March 2024 Pathology high-grade serous carcinoma, ER positive, p53 mutated, BRCA2 positive CARIS molecular testing, HRD positive, BRCA to positive, PD-L1 CPS score 5%, p53 mutation positive, MSI stable, low tumor mutation burden, BRAF negative, ER 50% positive, folate receptor 1 negative, 2+, 35%, HER2/neu 0 She progressed on olaparib  maintenance treatment  I reviewed CT imaging from August 2025 in comparison with CT imaging from June which showed positive response to therapy The plan will be to continue treatment for another 3 cycles before repeating imaging study again at the end of October She tolerated treatment well except for anemia, slight peripheral neuropathy and proteinuria We will proceed with chemotherapy as scheduled without bevacizumab  today In the future, I plan to reduce the dose of bevacizumab  to 10 mg/kg if we are able to resume next month

## 2024-07-05 NOTE — Assessment & Plan Note (Addendum)
 She denies major injury We discussed strategies for fall prevention and importance of adequate oral fluid intake

## 2024-07-05 NOTE — Assessment & Plan Note (Addendum)
 She has stable anemia  I believe this is her baseline Observe only

## 2024-07-05 NOTE — Patient Instructions (Signed)
 [facility] - A DEPT OF [facility]  Discharge Instructions: Thank you for choosing [facility] to provide your oncology and hematology care.   If you have a lab appointment with the Cancer Center, please go directly to the Cancer Center and check in at the registration area.   Wear comfortable clothing and clothing appropriate for easy access to any Portacath or PICC line.   We strive to give you quality time with your provider. You may need to reschedule your appointment if you arrive late (15 or more minutes).  Arriving late affects you and other patients whose appointments are after yours.  Also, if you miss three or more appointments without notifying the office, you may be dismissed from the clinic at the provider's discretion.      For prescription refill requests, have your pharmacy contact our office and allow 72 hours for refills to be completed.    Today you received the following chemotherapy and/or immunotherapy agents: Paclitaxel  (Taxol ) & Carboplatin  (Paraplatin )      To help prevent nausea and vomiting after your treatment, we encourage you to take your nausea medication as directed.  BELOW ARE SYMPTOMS THAT SHOULD BE REPORTED IMMEDIATELY: *FEVER GREATER THAN 100.4 F (38 C) OR HIGHER *CHILLS OR SWEATING *NAUSEA AND VOMITING THAT IS NOT CONTROLLED WITH YOUR NAUSEA MEDICATION *UNUSUAL SHORTNESS OF BREATH *UNUSUAL BRUISING OR BLEEDING *URINARY PROBLEMS (pain or burning when urinating, or frequent urination) *BOWEL PROBLEMS (unusual diarrhea, constipation, pain near the anus) TENDERNESS IN MOUTH AND THROAT WITH OR WITHOUT PRESENCE OF ULCERS (sore throat, sores in mouth, or a toothache) UNUSUAL RASH, SWELLING OR PAIN  UNUSUAL VAGINAL DISCHARGE OR ITCHING   Items with * indicate a potential emergency and should be followed up as soon as possible or go to the Emergency Department if any problems should occur.  Please show the  CHEMOTHERAPY ALERT CARD or IMMUNOTHERAPY ALERT CARD at check-in to the Emergency Department and triage nurse.  Should you have questions after your visit or need to cancel or reschedule your appointment, please contact [facility] - A DEPT [facility]  Dept: [phone]  and follow the prompts.  Office hours are 8:00 a.m. to 4:30 p.m. Monday - Friday. Please note that voicemails left after 4:00 p.m. may not be returned until the following business day.  We are closed weekends and major holidays. You have access to a nurse at all times for urgent questions. Please call the main number to the clinic Dept: [phone] and follow the prompts.   For any non-urgent questions, you may also contact your provider using MyChart. We now offer e-Visits for anyone 41 and older to request care online for non-urgent symptoms. For details visit [email or web address].   Also download the MyChart app! Go to the app store, search MyChart, open the app, select [facility], and log in with your MyChart username and password.

## 2024-07-06 LAB — CA 125: Cancer Antigen (CA) 125: 49 U/mL — ABNORMAL HIGH (ref 0.0–38.1)

## 2024-07-18 ENCOUNTER — Encounter: Payer: Self-pay | Admitting: Hematology and Oncology

## 2024-07-19 ENCOUNTER — Other Ambulatory Visit: Payer: Self-pay | Admitting: Hematology and Oncology

## 2024-07-24 ENCOUNTER — Encounter: Payer: Self-pay | Admitting: Hematology and Oncology

## 2024-07-25 ENCOUNTER — Other Ambulatory Visit: Payer: Self-pay

## 2024-07-25 MED FILL — Fosaprepitant Dimeglumine For IV Infusion 150 MG (Base Eq): INTRAVENOUS | Qty: 5 | Status: AC

## 2024-07-26 ENCOUNTER — Inpatient Hospital Stay

## 2024-07-26 ENCOUNTER — Other Ambulatory Visit: Payer: Self-pay

## 2024-07-26 ENCOUNTER — Inpatient Hospital Stay: Attending: Gynecologic Oncology

## 2024-07-26 ENCOUNTER — Inpatient Hospital Stay: Admitting: Hematology and Oncology

## 2024-07-26 ENCOUNTER — Encounter: Payer: Self-pay | Admitting: Hematology and Oncology

## 2024-07-26 VITALS — HR 108

## 2024-07-26 VITALS — BP 115/94 | HR 123 | Temp 98.2°F | Resp 18 | Ht 62.0 in | Wt 167.0 lb

## 2024-07-26 DIAGNOSIS — D6481 Anemia due to antineoplastic chemotherapy: Secondary | ICD-10-CM | POA: Insufficient documentation

## 2024-07-26 DIAGNOSIS — Z5111 Encounter for antineoplastic chemotherapy: Secondary | ICD-10-CM | POA: Insufficient documentation

## 2024-07-26 DIAGNOSIS — R300 Dysuria: Secondary | ICD-10-CM | POA: Diagnosis not present

## 2024-07-26 DIAGNOSIS — C482 Malignant neoplasm of peritoneum, unspecified: Secondary | ICD-10-CM | POA: Diagnosis not present

## 2024-07-26 DIAGNOSIS — R809 Proteinuria, unspecified: Secondary | ICD-10-CM | POA: Insufficient documentation

## 2024-07-26 DIAGNOSIS — Z79899 Other long term (current) drug therapy: Secondary | ICD-10-CM | POA: Diagnosis not present

## 2024-07-26 DIAGNOSIS — Z853 Personal history of malignant neoplasm of breast: Secondary | ICD-10-CM | POA: Diagnosis not present

## 2024-07-26 DIAGNOSIS — T451X5A Adverse effect of antineoplastic and immunosuppressive drugs, initial encounter: Secondary | ICD-10-CM | POA: Insufficient documentation

## 2024-07-26 DIAGNOSIS — R7989 Other specified abnormal findings of blood chemistry: Secondary | ICD-10-CM | POA: Insufficient documentation

## 2024-07-26 DIAGNOSIS — Z79633 Long term (current) use of mitotic inhibitor: Secondary | ICD-10-CM | POA: Diagnosis not present

## 2024-07-26 DIAGNOSIS — Z5112 Encounter for antineoplastic immunotherapy: Secondary | ICD-10-CM | POA: Insufficient documentation

## 2024-07-26 DIAGNOSIS — Z8571 Personal history of Hodgkin lymphoma: Secondary | ICD-10-CM | POA: Insufficient documentation

## 2024-07-26 DIAGNOSIS — Z79630 Long term (current) use of alkylating agent: Secondary | ICD-10-CM | POA: Insufficient documentation

## 2024-07-26 LAB — CBC WITH DIFFERENTIAL (CANCER CENTER ONLY)
Abs Immature Granulocytes: 0.01 K/uL (ref 0.00–0.07)
Basophils Absolute: 0 K/uL (ref 0.0–0.1)
Basophils Relative: 0 %
Eosinophils Absolute: 0 K/uL (ref 0.0–0.5)
Eosinophils Relative: 0 %
HCT: 26.5 % — ABNORMAL LOW (ref 36.0–46.0)
Hemoglobin: 8.8 g/dL — ABNORMAL LOW (ref 12.0–15.0)
Immature Granulocytes: 0 %
Lymphocytes Relative: 25 %
Lymphs Abs: 1.4 K/uL (ref 0.7–4.0)
MCH: 32.1 pg (ref 26.0–34.0)
MCHC: 33.2 g/dL (ref 30.0–36.0)
MCV: 96.7 fL (ref 80.0–100.0)
Monocytes Absolute: 0.1 K/uL (ref 0.1–1.0)
Monocytes Relative: 2 %
Neutro Abs: 4.1 K/uL (ref 1.7–7.7)
Neutrophils Relative %: 73 %
Platelet Count: 199 K/uL (ref 150–400)
RBC: 2.74 MIL/uL — ABNORMAL LOW (ref 3.87–5.11)
RDW: 24.3 % — ABNORMAL HIGH (ref 11.5–15.5)
WBC Count: 5.6 K/uL (ref 4.0–10.5)
nRBC: 0.5 % — ABNORMAL HIGH (ref 0.0–0.2)

## 2024-07-26 LAB — CMP (CANCER CENTER ONLY)
ALT: 17 U/L (ref 0–44)
AST: 18 U/L (ref 15–41)
Albumin: 3.9 g/dL (ref 3.5–5.0)
Alkaline Phosphatase: 76 U/L (ref 38–126)
Anion gap: 9 (ref 5–15)
BUN: 26 mg/dL — ABNORMAL HIGH (ref 6–20)
CO2: 25 mmol/L (ref 22–32)
Calcium: 9.7 mg/dL (ref 8.9–10.3)
Chloride: 103 mmol/L (ref 98–111)
Creatinine: 1.03 mg/dL — ABNORMAL HIGH (ref 0.44–1.00)
GFR, Estimated: >60 mL/min (ref >60)
Glucose, Bld: 227 mg/dL — ABNORMAL HIGH (ref 70–99)
Potassium: 3.9 mmol/L (ref 3.5–5.1)
Sodium: 137 mmol/L (ref 135–145)
Total Bilirubin: 0.2 mg/dL (ref 0.0–1.2)
Total Protein: 8.6 g/dL — ABNORMAL HIGH (ref 6.5–8.1)

## 2024-07-26 LAB — TOTAL PROTEIN, URINE DIPSTICK: Protein, ur: 300 mg/dL — AB

## 2024-07-26 MED ORDER — SODIUM CHLORIDE 0.9 % IV SOLN
476.0000 mg | Freq: Once | INTRAVENOUS | Status: AC
  Administered 2024-07-26: 480 mg via INTRAVENOUS
  Filled 2024-07-26: qty 48

## 2024-07-26 MED ORDER — SODIUM CHLORIDE 0.9 % IV SOLN
150.0000 mg | Freq: Once | INTRAVENOUS | Status: AC
  Administered 2024-07-26: 150 mg via INTRAVENOUS
  Filled 2024-07-26: qty 150

## 2024-07-26 MED ORDER — DEXAMETHASONE SODIUM PHOSPHATE 10 MG/ML IJ SOLN
10.0000 mg | Freq: Once | INTRAMUSCULAR | Status: AC
  Administered 2024-07-26: 10 mg via INTRAVENOUS
  Filled 2024-07-26: qty 1

## 2024-07-26 MED ORDER — FAMOTIDINE IN NACL 20-0.9 MG/50ML-% IV SOLN
20.0000 mg | Freq: Once | INTRAVENOUS | Status: AC
  Administered 2024-07-26: 20 mg via INTRAVENOUS
  Filled 2024-07-26: qty 50

## 2024-07-26 MED ORDER — SODIUM CHLORIDE 0.9 % IV SOLN
131.2500 mg/m2 | Freq: Once | INTRAVENOUS | Status: AC
  Administered 2024-07-26: 240 mg via INTRAVENOUS
  Filled 2024-07-26: qty 40

## 2024-07-26 MED ORDER — SODIUM CHLORIDE 0.9 % IV SOLN: INTRAVENOUS | Status: DC

## 2024-07-26 MED ORDER — PALONOSETRON HCL INJECTION 0.25 MG/5ML
0.2500 mg | Freq: Once | INTRAVENOUS | Status: AC
  Administered 2024-07-26: 0.25 mg via INTRAVENOUS
  Filled 2024-07-26: qty 5

## 2024-07-26 MED ORDER — CETIRIZINE HCL 10 MG/ML IV SOLN
10.0000 mg | Freq: Once | INTRAVENOUS | Status: AC
  Administered 2024-07-26: 10 mg via INTRAVENOUS
  Filled 2024-07-26: qty 1

## 2024-07-26 NOTE — Assessment & Plan Note (Addendum)
 She has remote history of Hodgkin lymphoma and breast cancer She was diagnosed with stage III primary peritoneal cancer in 2023 treated with neoadjuvant chemotherapy followed by surgery and completion of chemotherapy by March 2024 Pathology high-grade serous carcinoma, ER positive, p53 mutated, BRCA2 positive CARIS molecular testing, HRD positive, BRCA to positive, PD-L1 CPS score 5%, p53 mutation positive, MSI stable, low tumor mutation burden, BRAF negative, ER 50% positive, folate receptor 1 negative, 2+, 35%, HER2/neu 0 She progressed on olaparib  maintenance treatment  I reviewed CT imaging from August 2025 in comparison with CT imaging from June which showed positive response to therapy She tolerated treatment well except for anemia, slight peripheral neuropathy and proteinuria Due to heavy proteinuria, bevacizumab  will be placed on hold She will complete last cycle of treatment today I will order urine culture to rule out urinary tract infection It is not clear to me that she can use bevacizumab  as maintenance treatment given her proteinuria I will reassess next month If we are not able to resume bevacizumab , I will order CT imaging and switch her to niraparib for maintenance

## 2024-07-26 NOTE — Assessment & Plan Note (Addendum)
 Due to heavy proteinuria, I will order urine culture If that comes back negative, I will refer her to see nephrologist for investigation of heavy proteinuria

## 2024-07-26 NOTE — Progress Notes (Signed)
 [facility] OFFICE PROGRESS NOTE  Patient Care Team: [name], FNP as PCP - General (Internal Medicine) [name], MD as PCP - Cardiology (Cardiology) [name], MD as Consulting Physician (Gastroenterology)  Assessment & Plan Dysuria Due to heavy proteinuria, I will order urine culture If that comes back negative, I will refer her to see nephrologist for investigation of heavy proteinuria Primary peritoneal adenocarcinoma [facility]) She has remote history of Hodgkin lymphoma and breast cancer She was diagnosed with stage III primary peritoneal cancer in 2023 treated with neoadjuvant chemotherapy followed by surgery and completion of chemotherapy by March 2024 Pathology high-grade serous carcinoma, ER positive, p53 mutated, BRCA2 positive CARIS molecular testing, HRD positive, BRCA to positive, PD-L1 CPS score 5%, p53 mutation positive, MSI stable, low tumor mutation burden, BRAF negative, ER 50% positive, folate receptor 1 negative, 2+, 35%, HER2/neu 0 She progressed on olaparib  maintenance treatment  I reviewed CT imaging from August 2025 in comparison with CT imaging from June which showed positive response to therapy She tolerated treatment well except for anemia, slight peripheral neuropathy and proteinuria Due to heavy proteinuria, bevacizumab  will be placed on hold She will complete last cycle of treatment today I will order urine culture to rule out urinary tract infection It is not clear to me that she can use bevacizumab  as maintenance treatment given her proteinuria I will reassess next month If we are not able to resume bevacizumab , I will order CT imaging and switch her to niraparib for maintenance  Anemia due to antineoplastic chemotherapy She has slight progressive anemia but not symptomatic She will proceed with treatment without delay  Orders Placed This Encounter  Procedures   Urine Culture    Standing Status:   Future    Number of  Occurrences:   1    Expected Date:   07/26/2024    Expiration Date:   07/26/2025   CBC with Differential/Platelet    Standing Status:   Standing    Number of Occurrences:   22    Expiration Date:   07/26/2025   Comprehensive metabolic panel with GFR    Standing Status:   Standing    Number of Occurrences:   33    Expiration Date:   07/26/2025   Total Protein, Urine dipstick    Standing Status:   Standing    Number of Occurrences:   3    Expiration Date:   07/26/2025     Almarie Bedford, MD  INTERVAL HISTORY: she returns for treatment follow-up Complications related to previous cycle of chemotherapy included anemia, and proteinuria and peripheral neuropathy With documented blood pressure from home were normal She has not received bevacizumab  for some time We discussed future treatment and maintenance therapy and timing of her next imaging  PHYSICAL EXAMINATION: ECOG PERFORMANCE STATUS: 1 - Symptomatic but completely ambulatory  Lab Results  Component Value Date   CAN125 49.0 (H) 07/05/2024   CAN125 150.0 (H) 05/03/2024   CAN125 355.0 (H) 03/27/2024      Latest Ref Rng & Units 07/26/2024    9:30 AM 07/05/2024   11:59 AM 06/14/2024    8:24 AM  CBC  WBC 4.0 - 10.5 K/uL 5.6  6.6  8.2   Hemoglobin 12.0 - 15.0 g/dL 8.8  9.2  89.9   Hematocrit 36.0 - 46.0 % 26.5  27.8  29.4   Platelets 150 - 400 K/uL 199  241  355       Chemistry  Component Value Date/Time   NA 137 07/26/2024 0930   K 3.9 07/26/2024 0930   CL 103 07/26/2024 0930   CO2 25 07/26/2024 0930   BUN 26 (H) 07/26/2024 0930   CREATININE 1.03 (H) 07/26/2024 0930      Component Value Date/Time   CALCIUM  9.7 07/26/2024 0930   ALKPHOS 76 07/26/2024 0930   AST 18 07/26/2024 0930   ALT 17 07/26/2024 0930   BILITOT 0.2 07/26/2024 0930       Vitals:   07/26/24 0954  BP: (!) 115/94  Pulse: (!) 123  Resp: 18  Temp: 98.2 F (36.8 C)  SpO2: 98%   Filed Weights   07/26/24 0954  Weight: 167 lb (75.8 kg)   Other  relevant data reviewed during this visit included CBC, CMP, urine protein

## 2024-07-26 NOTE — Assessment & Plan Note (Addendum)
 She has slight progressive anemia but not symptomatic She will proceed with treatment without delay

## 2024-07-26 NOTE — Patient Instructions (Signed)
 [facility] - A DEPT OF [facility]  Discharge Instructions: Thank you for choosing [facility] to provide your oncology and hematology care.   If you have a lab appointment with the Cancer Center, please go directly to the Cancer Center and check in at the registration area.   Wear comfortable clothing and clothing appropriate for easy access to any Portacath or PICC line.   We strive to give you quality time with your provider. You may need to reschedule your appointment if you arrive late (15 or more minutes).  Arriving late affects you and other patients whose appointments are after yours.  Also, if you miss three or more appointments without notifying the office, you may be dismissed from the clinic at the provider's discretion.      For prescription refill requests, have your pharmacy contact our office and allow 72 hours for refills to be completed.    Today you received the following chemotherapy and/or immunotherapy agents taxol , carboplatin , and bevacizumab       To help prevent nausea and vomiting after your treatment, we encourage you to take your nausea medication as directed.  BELOW ARE SYMPTOMS THAT SHOULD BE REPORTED IMMEDIATELY: *FEVER GREATER THAN 100.4 F (38 C) OR HIGHER *CHILLS OR SWEATING *NAUSEA AND VOMITING THAT IS NOT CONTROLLED WITH YOUR NAUSEA MEDICATION *UNUSUAL SHORTNESS OF BREATH *UNUSUAL BRUISING OR BLEEDING *URINARY PROBLEMS (pain or burning when urinating, or frequent urination) *BOWEL PROBLEMS (unusual diarrhea, constipation, pain near the anus) TENDERNESS IN MOUTH AND THROAT WITH OR WITHOUT PRESENCE OF ULCERS (sore throat, sores in mouth, or a toothache) UNUSUAL RASH, SWELLING OR PAIN  UNUSUAL VAGINAL DISCHARGE OR ITCHING   Items with * indicate a potential emergency and should be followed up as soon as possible or go to the Emergency Department if any problems should occur.  Please show the CHEMOTHERAPY  ALERT CARD or IMMUNOTHERAPY ALERT CARD at check-in to the Emergency Department and triage nurse.  Should you have questions after your visit or need to cancel or reschedule your appointment, please contact [facility]  Dept: [phone]  and follow the prompts.  Office hours are 8:00 a.m. to 4:30 p.m. Monday - Friday. Please note that voicemails left after 4:00 p.m. may not be returned until the following business day.  We are closed weekends and major holidays. You have access to a nurse at all times for urgent questions. Please call the main number to the clinic Dept: [phone] and follow the prompts.   For any non-urgent questions, you may also contact your provider using MyChart. We now offer e-Visits for anyone 30 and older to request care online for non-urgent symptoms. For details visit [email or web address].   Also download the MyChart app! Go to the app store, search MyChart, open the app, select [facility], and log in with your MyChart username and password.

## 2024-07-27 ENCOUNTER — Telehealth: Payer: Self-pay

## 2024-07-27 ENCOUNTER — Other Ambulatory Visit: Payer: Self-pay | Admitting: Hematology and Oncology

## 2024-07-27 ENCOUNTER — Ambulatory Visit: Payer: Self-pay | Admitting: Hematology and Oncology

## 2024-07-27 DIAGNOSIS — C482 Malignant neoplasm of peritoneum, unspecified: Secondary | ICD-10-CM

## 2024-07-27 DIAGNOSIS — R809 Proteinuria, unspecified: Secondary | ICD-10-CM

## 2024-07-27 LAB — URINE CULTURE: Culture: 10000 — AB

## 2024-07-27 NOTE — Telephone Encounter (Signed)
 Called and given below message. She verbalized understanding.

## 2024-07-27 NOTE — Telephone Encounter (Signed)
 Faxed referral to Dr. Dolan at [phone], received fax confirmation.

## 2024-07-29 ENCOUNTER — Other Ambulatory Visit: Payer: Self-pay

## 2024-08-03 ENCOUNTER — Telehealth: Payer: Self-pay

## 2024-08-03 NOTE — Telephone Encounter (Signed)
 Returned her call and review upcoming appts. She ask about next scan told her that Dr. Lonn would order at next office visit. She verbalized understanding. Called and left a message for [facility] asking for a call back to the office regarding referral. Anayia Eugene.

## 2024-08-21 DIAGNOSIS — R7989 Other specified abnormal findings of blood chemistry: Secondary | ICD-10-CM | POA: Diagnosis not present

## 2024-08-21 DIAGNOSIS — C50919 Malignant neoplasm of unspecified site of unspecified female breast: Secondary | ICD-10-CM | POA: Diagnosis not present

## 2024-08-21 DIAGNOSIS — R809 Proteinuria, unspecified: Secondary | ICD-10-CM | POA: Diagnosis not present

## 2024-08-21 DIAGNOSIS — C8190 Hodgkin lymphoma, unspecified, unspecified site: Secondary | ICD-10-CM | POA: Diagnosis not present

## 2024-08-23 ENCOUNTER — Encounter: Payer: Self-pay | Admitting: Hematology and Oncology

## 2024-08-23 ENCOUNTER — Encounter: Payer: Self-pay | Admitting: Internal Medicine

## 2024-08-23 ENCOUNTER — Inpatient Hospital Stay (HOSPITAL_BASED_OUTPATIENT_CLINIC_OR_DEPARTMENT_OTHER): Admitting: Hematology and Oncology

## 2024-08-23 ENCOUNTER — Inpatient Hospital Stay

## 2024-08-23 VITALS — BP 127/70 | HR 117 | Temp 99.5°F | Resp 18 | Ht 62.0 in | Wt 166.4 lb

## 2024-08-23 VITALS — BP 124/75 | HR 101 | Temp 97.8°F | Resp 18

## 2024-08-23 DIAGNOSIS — C482 Malignant neoplasm of peritoneum, unspecified: Secondary | ICD-10-CM

## 2024-08-23 DIAGNOSIS — R300 Dysuria: Secondary | ICD-10-CM

## 2024-08-23 DIAGNOSIS — R809 Proteinuria, unspecified: Secondary | ICD-10-CM

## 2024-08-23 DIAGNOSIS — Z853 Personal history of malignant neoplasm of breast: Secondary | ICD-10-CM | POA: Diagnosis not present

## 2024-08-23 DIAGNOSIS — Z8571 Personal history of Hodgkin lymphoma: Secondary | ICD-10-CM | POA: Diagnosis not present

## 2024-08-23 DIAGNOSIS — Z79899 Other long term (current) drug therapy: Secondary | ICD-10-CM | POA: Diagnosis not present

## 2024-08-23 DIAGNOSIS — Z5111 Encounter for antineoplastic chemotherapy: Secondary | ICD-10-CM | POA: Diagnosis not present

## 2024-08-23 DIAGNOSIS — R7989 Other specified abnormal findings of blood chemistry: Secondary | ICD-10-CM | POA: Diagnosis not present

## 2024-08-23 DIAGNOSIS — D6481 Anemia due to antineoplastic chemotherapy: Secondary | ICD-10-CM

## 2024-08-23 DIAGNOSIS — T451X5A Adverse effect of antineoplastic and immunosuppressive drugs, initial encounter: Secondary | ICD-10-CM | POA: Diagnosis not present

## 2024-08-23 DIAGNOSIS — Z79630 Long term (current) use of alkylating agent: Secondary | ICD-10-CM | POA: Diagnosis not present

## 2024-08-23 DIAGNOSIS — Z5112 Encounter for antineoplastic immunotherapy: Secondary | ICD-10-CM | POA: Diagnosis not present

## 2024-08-23 DIAGNOSIS — Z79633 Long term (current) use of mitotic inhibitor: Secondary | ICD-10-CM | POA: Diagnosis not present

## 2024-08-23 LAB — CBC WITH DIFFERENTIAL (CANCER CENTER ONLY)
Abs Immature Granulocytes: 0.01 K/uL (ref 0.00–0.07)
Abs Immature Granulocytes: 0.04 K/uL (ref 0.00–0.07)
Basophils Absolute: 0 K/uL (ref 0.0–0.1)
Basophils Absolute: 0 K/uL (ref 0.0–0.1)
Basophils Relative: 1 %
Basophils Relative: 0 %
Eosinophils Absolute: 0 K/uL (ref 0.0–0.5)
Eosinophils Absolute: 0 K/uL (ref 0.0–0.5)
Eosinophils Relative: 1 %
Eosinophils Relative: 0 %
HCT: 20.8 % — ABNORMAL LOW (ref 36.0–46.0)
HCT: 21.1 % — ABNORMAL LOW (ref 36.0–46.0)
Hemoglobin: 6.9 g/dL — CL (ref 12.0–15.0)
Hemoglobin: 7 g/dL — ABNORMAL LOW (ref 12.0–15.0)
Immature Granulocytes: 0 %
Immature Granulocytes: 1 %
Lymphocytes Relative: 42 %
Lymphocytes Relative: 46 %
Lymphs Abs: 1.7 K/uL (ref 0.7–4.0)
Lymphs Abs: 2.2 K/uL (ref 0.7–4.0)
MCH: 34.2 pg — ABNORMAL HIGH (ref 26.0–34.0)
MCH: 34.1 pg — ABNORMAL HIGH (ref 26.0–34.0)
MCHC: 33.2 g/dL (ref 30.0–36.0)
MCHC: 33.2 g/dL (ref 30.0–36.0)
MCV: 103 fL — ABNORMAL HIGH (ref 80.0–100.0)
MCV: 102.9 fL — ABNORMAL HIGH (ref 80.0–100.0)
Monocytes Absolute: 0.6 K/uL (ref 0.1–1.0)
Monocytes Absolute: 0.7 K/uL (ref 0.1–1.0)
Monocytes Relative: 14 %
Monocytes Relative: 14 %
Neutro Abs: 1.8 K/uL (ref 1.7–7.7)
Neutro Abs: 1.9 K/uL (ref 1.7–7.7)
Neutrophils Relative %: 42 %
Neutrophils Relative %: 39 %
Platelet Count: 150 K/uL (ref 150–400)
Platelet Count: 159 K/uL (ref 150–400)
RBC: 2.02 MIL/uL — ABNORMAL LOW (ref 3.87–5.11)
RBC: 2.05 MIL/uL — ABNORMAL LOW (ref 3.87–5.11)
RDW: 24.4 % — ABNORMAL HIGH (ref 11.5–15.5)
RDW: 24.4 % — ABNORMAL HIGH (ref 11.5–15.5)
WBC Count: 4.1 K/uL (ref 4.0–10.5)
WBC Count: 4.8 K/uL (ref 4.0–10.5)
nRBC: 3.1 % — ABNORMAL HIGH (ref 0.0–0.2)
nRBC: 2.7 % — ABNORMAL HIGH (ref 0.0–0.2)

## 2024-08-23 LAB — COMPREHENSIVE METABOLIC PANEL WITH GFR
ALT: 14 U/L (ref 0–44)
AST: 16 U/L (ref 15–41)
Albumin: 3.6 g/dL (ref 3.5–5.0)
Alkaline Phosphatase: 82 U/L (ref 38–126)
Anion gap: 8 (ref 5–15)
BUN: 20 mg/dL (ref 6–20)
CO2: 26 mmol/L (ref 22–32)
Calcium: 8.8 mg/dL — ABNORMAL LOW (ref 8.9–10.3)
Chloride: 103 mmol/L (ref 98–111)
Creatinine, Ser: 0.9 mg/dL (ref 0.44–1.00)
GFR, Estimated: >60 mL/min (ref >60)
Glucose, Bld: 107 mg/dL — ABNORMAL HIGH (ref 70–99)
Potassium: 3.6 mmol/L (ref 3.5–5.1)
Sodium: 137 mmol/L (ref 135–145)
Total Bilirubin: 0.2 mg/dL (ref 0.0–1.2)
Total Protein: 7.7 g/dL (ref 6.5–8.1)

## 2024-08-23 LAB — TOTAL PROTEIN, URINE DIPSTICK: Protein, ur: 30 mg/dL — AB

## 2024-08-23 LAB — SAMPLE TO BLOOD BANK

## 2024-08-23 LAB — PREPARE RBC (CROSSMATCH)

## 2024-08-23 MED ORDER — SODIUM CHLORIDE 0.9% FLUSH
10.0000 mL | INTRAVENOUS | Status: DC | PRN
  Administered 2024-08-23: 10 mL

## 2024-08-23 MED ORDER — SODIUM CHLORIDE 0.9% IV SOLUTION
250.0000 mL | INTRAVENOUS | Status: DC
  Administered 2024-08-23: 100 mL via INTRAVENOUS

## 2024-08-23 MED ORDER — ACETAMINOPHEN 325 MG PO TABS
650.0000 mg | ORAL_TABLET | Freq: Once | ORAL | Status: AC
  Administered 2024-08-23: 650 mg via ORAL
  Filled 2024-08-23: qty 2

## 2024-08-23 MED ORDER — SODIUM CHLORIDE 0.9 % IV SOLN
5.0000 mg/kg | Freq: Once | INTRAVENOUS | Status: AC
  Administered 2024-08-23: 400 mg via INTRAVENOUS
  Filled 2024-08-23: qty 16

## 2024-08-23 MED ORDER — SODIUM CHLORIDE 0.9 % IV SOLN: INTRAVENOUS | Status: DC

## 2024-08-23 MED ORDER — DIPHENHYDRAMINE HCL 25 MG PO CAPS
25.0000 mg | ORAL_CAPSULE | Freq: Once | ORAL | Status: AC
  Administered 2024-08-23: 25 mg via ORAL
  Filled 2024-08-23: qty 1

## 2024-08-23 NOTE — Progress Notes (Signed)
 [facility] OFFICE PROGRESS NOTE  Patient Care Team: [name], FNP as PCP - General (Internal Medicine) Thukkani, Arun K, MD as PCP - Cardiology (Cardiology) [name], MD as Consulting Physician (Gastroenterology)  Assessment & Plan Anemia due to antineoplastic chemotherapy We discussed some of the risks, benefits, and alternatives of blood transfusions. The patient is symptomatic from anemia and the hemoglobin level is critically low.  Some of the side-effects to be expected including risks of transfusion reactions, chills, infection, syndrome of volume overload and risk of hospitalization from various reasons and the patient is willing to proceed and went ahead to sign consent today. 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[facility]) She has remote history of Hodgkin lymphoma and breast cancer She was diagnosed with stage III primary peritoneal cancer in 2023 treated with neoadjuvant chemotherapy followed by surgery and completion of chemotherapy by March 2024 Pathology high-grade serous carcinoma, ER positive, p53 mutated, BRCA2 positive CARIS molecular testing, HRD positive, BRCA to positive, PD-L1 CPS score 5%, p53 mutation positive, MSI stable, low tumor mutation burden, BRAF negative, ER 50% positive, folate receptor 1 negative, 2+, 35%, HER2/neu 0 She progressed on olaparib  maintenance treatment  I reviewed CT imaging from August 2025 in comparison with CT imaging from June which showed positive response to therapy  She just completed recent chemotherapy 4 weeks ago Repeat CBC shows severe anemia We will proceed with bevacizumab  with blood transfusion support I plan to repeat imaging study next month along with additional labs for evaluation of anemia Elevated serum creatinine She was seen by nephrologist recently She will complete further workup as directed Proteinuria, unspecified type Repeat urine protein  is lower Will proceed with low-dose bevacizumab   Orders Placed This Encounter  Procedures   Ovarian CT ABD/PELVIS no oral contrast    No need oral contrast    Standing Status:   Future    Expected Date:   09/06/2024    Expiration Date:   08/23/2025    If indicated for the ordered procedure, I authorize the administration of contrast media per Radiology protocol:   Yes    Does the patient have a contrast media/X-ray dye allergy?:   No    Is patient pregnant?:   No    Preferred imaging location?:   [facility]    If indicated for the ordered procedure, I authorize the administration of oral contrast media per Radiology protocol:   No    Reason for no oral contrast::   No need for oral contrast   CBC with Differential (Cancer Center Only)    Standing Status:   Future    Number of Occurrences:   1    Expected Date:   08/23/2024    Expiration Date:   08/23/2025   Iron and Iron Binding Capacity ([facility] only)    Standing Status:   Future    Expected Date:   09/13/2024    Expiration Date:   08/23/2025   Ferritin    Standing Status:   Future    Expected Date:   09/13/2024    Expiration Date:   08/23/2025   Vitamin B12    Standing Status:   Future    Expected Date:   09/13/2024    Expiration Date:   08/23/2025   Informed Consent Details: Physician/Practitioner Attestation; Transcribe to consent form and obtain patient signature    Standing Status:   Future    Expiration Date:  08/23/2025    Physician/Practitioner attestation of informed consent for blood and or blood product transfusion:   I, the physician/practitioner, attest that I have discussed with the patient the benefits, risks, side effects, alternatives, likelihood of achieving goals and potential problems during recovery for the procedure that I have provided informed consent.    Product(s):   All Product(s)   Care order/instruction    Transfuse Parameters    Standing Status:   Future    Expiration Date:    08/23/2025   Type and screen         Standing Status:   Future    Number of Occurrences:   1    Expected Date:   08/23/2024    Expiration Date:   08/23/2025   Type and screen         Standing Status:   Future    Expected Date:   08/23/2024    Expiration Date:   08/23/2025   Sample to Blood Bank    Standing Status:   Standing    Number of Occurrences:   33    Expiration Date:   08/23/2025     Lauren Bedford, MD  INTERVAL HISTORY: she returns for treatment follow-up Complications related to previous cycle of chemotherapy included anemia, She is tachycardic She denies chest pain or shortness of breath or dizziness I reviewed test results and we discussed timing of her next imaging and the rationale behind blood transfusion support  PHYSICAL EXAMINATION: ECOG PERFORMANCE STATUS: 1 - Symptomatic but completely ambulatory  Lab Results  Component Value Date   CAN125 49.0 (H) 07/05/2024   CAN125 150.0 (H) 05/03/2024   CAN125 355.0 (H) 03/27/2024      Latest Ref Rng & Units 08/23/2024   12:12 PM 08/23/2024   11:23 AM 07/26/2024    9:30 AM  CBC  WBC 4.0 - 10.5 K/uL 4.8  4.1  5.6   Hemoglobin 12.0 - 15.0 g/dL 7.0  6.9  8.8   Hematocrit 36.0 - 46.0 % 21.1  20.8  26.5   Platelets 150 - 400 K/uL 159  150  199       Chemistry      Component Value Date/Time   NA 137 08/23/2024 1123   K 3.6 08/23/2024 1123   CL 103 08/23/2024 1123   CO2 26 08/23/2024 1123   BUN 20 08/23/2024 1123   CREATININE 0.90 08/23/2024 1123   CREATININE 1.03 (H) 07/26/2024 0930      Component Value Date/Time   CALCIUM  8.8 (L) 08/23/2024 1123   ALKPHOS 82 08/23/2024 1123   AST 16 08/23/2024 1123   AST 18 07/26/2024 0930   ALT 14 08/23/2024 1123   ALT 17 07/26/2024 0930   BILITOT 0.2 08/23/2024 1123   BILITOT 0.2 07/26/2024 0930       Vitals:   08/23/24 1148  BP: 127/70  Pulse: (!) 117  Resp: 18  Temp: 99.5 F (37.5 C)  SpO2: 98%   Filed Weights   08/23/24 1148  Weight: 166 lb 6.4 oz  (75.5 kg)   Other relevant data reviewed during this visit included CBC, CMP, urine protein

## 2024-08-23 NOTE — Assessment & Plan Note (Addendum)
We discussed some of the risks, benefits, and alternatives of blood transfusions. The patient is symptomatic from anemia and the hemoglobin level is critically low.  Some of the side-effects to be expected including risks of transfusion reactions, chills, infection, syndrome of volume overload and risk of hospitalization from various reasons and the patient is willing to proceed and went ahead to sign consent today. We will proceed with 1 unit of blood transfusion today

## 2024-08-23 NOTE — Assessment & Plan Note (Addendum)
 Repeat urine protein is lower Will proceed with low-dose bevacizumab 

## 2024-08-23 NOTE — Assessment & Plan Note (Addendum)
 She was seen by nephrologist recently She will complete further workup as directed

## 2024-08-23 NOTE — Progress Notes (Signed)
 CRITICAL VALUE STICKER  CRITICAL VALUE: HGB 6.9  RECEIVER (on-site recipient of call): Erminio Louder, RN  DATE & TIME NOTIFIED: 08/23/2024 11:59   MESSENGER (representative from lab):Powell Beat   MD NOTIFIED: Dr. Lonn  TIME OF NOTIFICATION: 08/23/24 at 1201  RESPONSE: Will repeat CBC and order blood transfusion if needed.

## 2024-08-23 NOTE — Patient Instructions (Signed)
 [facility] - A DEPT OF [facility]  Discharge Instructions: Thank you for choosing [facility] to provide your oncology and hematology care.   If you have a lab appointment with the Cancer Center, please go directly to the Cancer Center and check in at the registration area.   Wear comfortable clothing and clothing appropriate for easy access to any Portacath or PICC line.   We strive to give you quality time with your provider. You may need to reschedule your appointment if you arrive late (15 or more minutes).  Arriving late affects you and other patients whose appointments are after yours.  Also, if you miss three or more appointments without notifying the office, you may be dismissed from the clinic at the provider's discretion.      For prescription refill requests, have your pharmacy contact our office and allow 72 hours for refills to be completed.    Today you received the following chemotherapy and/or immunotherapy agents: bevacizumab  & blood   To help prevent nausea and vomiting after your treatment, we encourage you to take your nausea medication as directed.  BELOW ARE SYMPTOMS THAT SHOULD BE REPORTED IMMEDIATELY: *FEVER GREATER THAN 100.4 F (38 C) OR HIGHER *CHILLS OR SWEATING *NAUSEA AND VOMITING THAT IS NOT CONTROLLED WITH YOUR NAUSEA MEDICATION *UNUSUAL SHORTNESS OF BREATH *UNUSUAL BRUISING OR BLEEDING *URINARY PROBLEMS (pain or burning when urinating, or frequent urination) *BOWEL PROBLEMS (unusual diarrhea, constipation, pain near the anus) TENDERNESS IN MOUTH AND THROAT WITH OR WITHOUT PRESENCE OF ULCERS (sore throat, sores in mouth, or a toothache) UNUSUAL RASH, SWELLING OR PAIN  UNUSUAL VAGINAL DISCHARGE OR ITCHING   Items with * indicate a potential emergency and should be followed up as soon as possible or go to the Emergency Department if any problems should occur.  Please show the CHEMOTHERAPY ALERT CARD or  IMMUNOTHERAPY ALERT CARD at check-in to the Emergency Department and triage nurse.  Should you have questions after your visit or need to cancel or reschedule your appointment, please contact [facility] - A DEPT [facility]  Dept: [phone]  and follow the prompts.  Office hours are 8:00 a.m. to 4:30 p.m. Monday - Friday. Please note that voicemails left after 4:00 p.m. may not be returned until the following business day.  We are closed weekends and major holidays. You have access to a nurse at all times for urgent questions. Please call the main number to the clinic Dept: [phone] and follow the prompts.   For any non-urgent questions, you may also contact your provider using MyChart. We now offer e-Visits for anyone 59 and older to request care online for non-urgent symptoms. For details visit [email or web address].   Also download the MyChart app! Go to the app store, search MyChart, open the app, select [place], and log in with your MyChart username and password.  Blood Transfusion, Adult, Care After The following information offers guidance on how to care for yourself after your procedure. Your health care provider may also give you more specific instructions. If you have problems or questions, contact your health care provider. What can I expect after the procedure? After the procedure, it is common to have: Bruising and soreness where the IV was inserted. A headache. Follow these instructions at home: IV insertion site care     Follow instructions from your health care provider about how to take care of your  IV insertion site. Make sure you: Wash your hands with soap and water  for at least 20 seconds before and after you change your bandage (dressing). If soap and water  are not available, use hand sanitizer. Change your dressing as told by your health care provider. Check your IV insertion site every day for signs of infection. Check  for: Redness, swelling, or pain. Bleeding from the site. Warmth. Pus or a bad smell. General instructions Take over-the-counter and prescription medicines only as told by your health care provider. Rest as told by your health care provider. Return to your normal activities as told by your health care provider. Keep all follow-up visits. Lab tests may need to be done at certain periods to recheck your blood counts. Contact a health care provider if: You have itching or red, swollen areas of skin (hives). You have a fever or chills. You have pain in the head, back, or chest. You feel anxious or you feel weak after doing your normal activities. You have redness, swelling, warmth, or pain around the IV insertion site. You have blood coming from the IV insertion site that does not stop with pressure. You have pus or a bad smell coming from your IV insertion site. If you received your blood transfusion in an outpatient setting, you will be told whom to contact to report any reactions. Get help right away if: You have symptoms of a serious allergic or immune system reaction, including: Trouble breathing or shortness of breath. Swelling of the face, feeling flushed, or widespread rash. Dark urine or blood in the urine. Fast heartbeat. These symptoms may be an emergency. Get help right away. Call 911. Do not wait to see if the symptoms will go away. Do not drive yourself to the hospital. Summary Bruising and soreness around the IV insertion site are common. Check your IV insertion site every day for signs of infection. Rest as told by your health care provider. Return to your normal activities as told by your health care provider. Get help right away for symptoms of a serious allergic or immune system reaction to the blood transfusion. This information is not intended to replace advice given to you by your health care provider. Make sure you discuss any questions you have with your health  care provider. Document Revised: 01/08/2022 Document Reviewed: 01/08/2022 Elsevier Patient Education  2024 [employer].

## 2024-08-23 NOTE — Assessment & Plan Note (Addendum)
 She has remote history of Hodgkin lymphoma and breast cancer She was diagnosed with stage III primary peritoneal cancer in 2023 treated with neoadjuvant chemotherapy followed by surgery and completion of chemotherapy by March 2024 Pathology high-grade serous carcinoma, ER positive, p53 mutated, BRCA2 positive CARIS molecular testing, HRD positive, BRCA to positive, PD-L1 CPS score 5%, p53 mutation positive, MSI stable, low tumor mutation burden, BRAF negative, ER 50% positive, folate receptor 1 negative, 2+, 35%, HER2/neu 0 She progressed on olaparib  maintenance treatment  I reviewed CT imaging from August 2025 in comparison with CT imaging from June which showed positive response to therapy  She just completed recent chemotherapy 4 weeks ago Repeat CBC shows severe anemia We will proceed with bevacizumab  with blood transfusion support I plan to repeat imaging study next month along with additional labs for evaluation of anemia

## 2024-08-24 ENCOUNTER — Other Ambulatory Visit: Payer: Self-pay

## 2024-08-24 LAB — TYPE AND SCREEN
ABO/RH(D): A POS
Antibody Screen: NEGATIVE
Unit division: 0

## 2024-08-24 LAB — BPAM RBC
Blood Product Expiration Date: 202511182359
ISSUE DATE / TIME: 202510301411
Unit Type and Rh: 6200

## 2024-08-24 LAB — CA 125: Cancer Antigen (CA) 125: 33.5 U/mL (ref 0.0–38.1)

## 2024-08-27 DIAGNOSIS — R809 Proteinuria, unspecified: Secondary | ICD-10-CM | POA: Diagnosis not present

## 2024-09-03 ENCOUNTER — Encounter: Payer: Self-pay | Admitting: Hematology and Oncology

## 2024-09-04 ENCOUNTER — Telehealth: Payer: Self-pay

## 2024-09-04 NOTE — Telephone Encounter (Signed)
 Called to see when the form she needed completed needs to be filled out by. She needs the form completed by 11/30 and Dr. Lonn can fill out on 11/20 appt.

## 2024-09-06 ENCOUNTER — Ambulatory Visit (HOSPITAL_COMMUNITY)
Admission: RE | Admit: 2024-09-06 | Discharge: 2024-09-06 | Disposition: A | Discharge status: Home / Self Care | Source: Ambulatory Visit | Attending: Hematology and Oncology | Admitting: Hematology and Oncology

## 2024-09-06 DIAGNOSIS — K5730 Diverticulosis of large intestine without perforation or abscess without bleeding: Secondary | ICD-10-CM | POA: Diagnosis not present

## 2024-09-06 DIAGNOSIS — T451X5A Adverse effect of antineoplastic and immunosuppressive drugs, initial encounter: Secondary | ICD-10-CM | POA: Insufficient documentation

## 2024-09-06 DIAGNOSIS — C482 Malignant neoplasm of peritoneum, unspecified: Secondary | ICD-10-CM | POA: Insufficient documentation

## 2024-09-06 DIAGNOSIS — C569 Malignant neoplasm of unspecified ovary: Secondary | ICD-10-CM | POA: Diagnosis not present

## 2024-09-06 DIAGNOSIS — D6481 Anemia due to antineoplastic chemotherapy: Secondary | ICD-10-CM | POA: Insufficient documentation

## 2024-09-06 DIAGNOSIS — K668 Other specified disorders of peritoneum: Secondary | ICD-10-CM | POA: Diagnosis not present

## 2024-09-06 MED ORDER — SODIUM CHLORIDE (PF) 0.9 % IJ SOLN
INTRAMUSCULAR | Status: AC
  Filled 2024-09-06: qty 50

## 2024-09-06 MED ORDER — IOHEXOL 300 MG/ML  SOLN
100.0000 mL | Freq: Once | INTRAMUSCULAR | Status: AC | PRN
  Administered 2024-09-06: 100 mL via INTRAVENOUS

## 2024-09-11 ENCOUNTER — Other Ambulatory Visit: Payer: Self-pay

## 2024-09-13 ENCOUNTER — Encounter: Payer: Self-pay | Admitting: Hematology and Oncology

## 2024-09-13 ENCOUNTER — Inpatient Hospital Stay

## 2024-09-13 ENCOUNTER — Inpatient Hospital Stay: Attending: Gynecologic Oncology

## 2024-09-13 ENCOUNTER — Inpatient Hospital Stay (HOSPITAL_BASED_OUTPATIENT_CLINIC_OR_DEPARTMENT_OTHER): Admitting: Hematology and Oncology

## 2024-09-13 ENCOUNTER — Other Ambulatory Visit: Payer: Self-pay | Admitting: *Deleted

## 2024-09-13 ENCOUNTER — Telehealth: Payer: Self-pay

## 2024-09-13 VITALS — BP 116/69 | HR 115 | Temp 98.5°F | Resp 18 | Wt 165.0 lb

## 2024-09-13 DIAGNOSIS — Z5112 Encounter for antineoplastic immunotherapy: Secondary | ICD-10-CM | POA: Diagnosis present

## 2024-09-13 DIAGNOSIS — D6481 Anemia due to antineoplastic chemotherapy: Secondary | ICD-10-CM

## 2024-09-13 DIAGNOSIS — R809 Proteinuria, unspecified: Secondary | ICD-10-CM | POA: Diagnosis not present

## 2024-09-13 DIAGNOSIS — I429 Cardiomyopathy, unspecified: Secondary | ICD-10-CM

## 2024-09-13 DIAGNOSIS — Z8571 Personal history of Hodgkin lymphoma: Secondary | ICD-10-CM | POA: Insufficient documentation

## 2024-09-13 DIAGNOSIS — Z1501 Genetic susceptibility to malignant neoplasm of breast: Secondary | ICD-10-CM | POA: Diagnosis not present

## 2024-09-13 DIAGNOSIS — R000 Tachycardia, unspecified: Secondary | ICD-10-CM

## 2024-09-13 DIAGNOSIS — Z853 Personal history of malignant neoplasm of breast: Secondary | ICD-10-CM | POA: Diagnosis not present

## 2024-09-13 DIAGNOSIS — Z79899 Other long term (current) drug therapy: Secondary | ICD-10-CM | POA: Insufficient documentation

## 2024-09-13 DIAGNOSIS — Z5111 Encounter for antineoplastic chemotherapy: Secondary | ICD-10-CM | POA: Diagnosis present

## 2024-09-13 DIAGNOSIS — Z1509 Genetic susceptibility to other malignant neoplasm: Secondary | ICD-10-CM | POA: Diagnosis not present

## 2024-09-13 DIAGNOSIS — T451X5A Adverse effect of antineoplastic and immunosuppressive drugs, initial encounter: Secondary | ICD-10-CM

## 2024-09-13 DIAGNOSIS — C482 Malignant neoplasm of peritoneum, unspecified: Secondary | ICD-10-CM | POA: Diagnosis present

## 2024-09-13 DIAGNOSIS — R7989 Other specified abnormal findings of blood chemistry: Secondary | ICD-10-CM | POA: Insufficient documentation

## 2024-09-13 LAB — CBC WITH DIFFERENTIAL (CANCER CENTER ONLY)
Abs Immature Granulocytes: 0.03 K/uL (ref 0.00–0.07)
Basophils Absolute: 0 K/uL (ref 0.0–0.1)
Basophils Relative: 0 %
Eosinophils Absolute: 0 K/uL (ref 0.0–0.5)
Eosinophils Relative: 0 %
HCT: 26.5 % — ABNORMAL LOW (ref 36.0–46.0)
Hemoglobin: 8.7 g/dL — ABNORMAL LOW (ref 12.0–15.0)
Immature Granulocytes: 0 %
Lymphocytes Relative: 33 %
Lymphs Abs: 2.6 K/uL (ref 0.7–4.0)
MCH: 33.2 pg (ref 26.0–34.0)
MCHC: 32.8 g/dL (ref 30.0–36.0)
MCV: 101.1 fL — ABNORMAL HIGH (ref 80.0–100.0)
Monocytes Absolute: 0.9 K/uL (ref 0.1–1.0)
Monocytes Relative: 12 %
Neutro Abs: 4.2 K/uL (ref 1.7–7.7)
Neutrophils Relative %: 55 %
Platelet Count: 345 K/uL (ref 150–400)
RBC: 2.62 MIL/uL — ABNORMAL LOW (ref 3.87–5.11)
RDW: 19.8 % — ABNORMAL HIGH (ref 11.5–15.5)
WBC Count: 7.7 K/uL (ref 4.0–10.5)
nRBC: 0.6 % — ABNORMAL HIGH (ref 0.0–0.2)

## 2024-09-13 LAB — IRON AND IRON BINDING CAPACITY (CC-WL,HP ONLY)
Iron: 17 ug/dL — ABNORMAL LOW (ref 28–170)
Saturation Ratios: 7 % — ABNORMAL LOW (ref 10.4–31.8)
TIBC: 259 ug/dL (ref 250–450)
UIBC: 242 ug/dL

## 2024-09-13 LAB — FERRITIN: Ferritin: 851 ng/mL — ABNORMAL HIGH (ref 11–307)

## 2024-09-13 LAB — VITAMIN B12: Vitamin B-12: 719 pg/mL (ref 180–914)

## 2024-09-13 LAB — TSH: TSH: 0.717 u[IU]/mL (ref 0.350–4.500)

## 2024-09-13 MED ORDER — CARVEDILOL 3.125 MG PO TABS: 3.1250 mg | ORAL_TABLET | Freq: Two times a day (BID) | ORAL | 1 refills | Status: AC

## 2024-09-13 MED ORDER — SODIUM CHLORIDE 0.9 % IV SOLN: INTRAVENOUS | Status: DC

## 2024-09-13 MED ORDER — SODIUM CHLORIDE 0.9 % IV SOLN
5.0000 mg/kg | Freq: Once | INTRAVENOUS | Status: AC
  Administered 2024-09-13: 400 mg via INTRAVENOUS
  Filled 2024-09-13: qty 16

## 2024-09-13 NOTE — Assessment & Plan Note (Addendum)
 She has history of cardiomyopathy with tachycardia and intermittent low blood pressure She has not seen her cardiologist for some time I will start her on low-dose carvedilol Recommend the patient to reach out to her cardiologist for appointment and medical management

## 2024-09-13 NOTE — Assessment & Plan Note (Addendum)
 She has remote history of Hodgkin lymphoma and breast cancer She was diagnosed with stage III primary peritoneal cancer in 2023 treated with neoadjuvant chemotherapy followed by surgery and completion of chemotherapy by March 2024 Pathology high-grade serous carcinoma, ER positive, p53 mutated, BRCA2 positive CARIS molecular testing, HRD positive, BRCA to positive, PD-L1 CPS score 5%, p53 mutation positive, MSI stable, low tumor mutation burden, BRAF negative, ER 50% positive, folate receptor 1 negative, 2+, 35%, HER2/neu 0 She progressed on olaparib  maintenance treatment  I reviewed CT imaging from August 2025 in comparison with CT imaging from June which showed positive response to therapy  She just completed recent chemotherapy recently CT imaging shows no signs of disease progression She will continue maintenance bevacizumab 

## 2024-09-13 NOTE — Telephone Encounter (Signed)
-----   Message from Almarie Bedford sent at 09/13/2024 12:11 PM EST ----- Please let her know TSH and B12 are normal

## 2024-09-13 NOTE — Telephone Encounter (Signed)
 Called and left below message. Ask her to call the office for questions. ?

## 2024-09-13 NOTE — Assessment & Plan Note (Addendum)
 She has multifactorial anemia, signs of iron deficiency I recommend the patient to start taking oral iron supplement and she agrees

## 2024-09-13 NOTE — Progress Notes (Signed)
 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[facility]) She has remote history of Hodgkin lymphoma and breast cancer She was diagnosed with stage III primary peritoneal cancer in 2023 treated with neoadjuvant chemotherapy followed by surgery and completion of chemotherapy by March 2024 Pathology high-grade serous carcinoma, ER positive, p53 mutated, BRCA2 positive CARIS molecular testing, HRD positive, BRCA to positive, PD-L1 CPS score 5%, p53 mutation positive, MSI stable, low tumor mutation burden, BRAF negative, ER 50% positive, folate receptor 1 negative, 2+, 35%, HER2/neu 0 She progressed on olaparib  maintenance treatment  I reviewed CT imaging from August 2025 in comparison with CT imaging from June which showed positive response to therapy  She just completed recent chemotherapy recently CT imaging shows no signs of disease progression She will continue maintenance bevacizumab  Tachycardia Recent TSH was normal.  I suspect this is related to her cardiomyopathy Anemia due to antineoplastic chemotherapy She has multifactorial anemia, signs of iron deficiency I recommend the patient to start taking oral iron supplement and she agrees Cardiomyopathy, unspecified type (HCC) She has history of cardiomyopathy with tachycardia and intermittent low blood pressure She has not seen her cardiologist for some time I will start her on low-dose carvedilol  Recommend the patient to reach out to her cardiologist for appointment and medical management  Orders Placed This Encounter  Procedures   TSH    Standing Status:   Future    Number of Occurrences:   1    Expected Date:   09/13/2024    Expiration Date:   09/13/2025     Lauren Bedford,  MD  INTERVAL HISTORY: she returns for treatment follow-up Complications related to previous cycle of chemotherapy included anemia, and tachycardia She was seen in the infusion room and again in clinic We reviewed test results and CT imaging I also spent some time completing paperwork for her for travel/pilgrimage to multiple PHYSICAL EXAMINATION: ECOG PERFORMANCE STATUS: 1 - Symptomatic but completely ambulatory  Lab Results  Component Value Date   CAN125 33.5 08/23/2024   CAN125 49.0 (H) 07/05/2024   CAN125 150.0 (H) 05/03/2024      Latest Ref Rng & Units 09/13/2024    8:29 AM 08/23/2024   12:12 PM 08/23/2024   11:23 AM  CBC  WBC 4.0 - 10.5 K/uL 7.7  4.8  4.1   Hemoglobin 12.0 - 15.0 g/dL 8.7  7.0  6.9   Hematocrit 36.0 - 46.0 % 26.5  21.1  20.8   Platelets 150 - 400 K/uL 345  159  150       Chemistry      Component Value Date/Time   NA 137 08/23/2024 1123   K 3.6 08/23/2024 1123   CL 103 08/23/2024 1123   CO2 26 08/23/2024 1123   BUN 20 08/23/2024 1123   CREATININE 0.90 08/23/2024 1123   CREATININE 1.03 (H) 07/26/2024 0930      Component Value Date/Time   CALCIUM  8.8 (L) 08/23/2024 1123   ALKPHOS 82 08/23/2024 1123   AST 16 08/23/2024 1123   AST 18 07/26/2024 0930   ALT 14 08/23/2024 1123   ALT 17 07/26/2024 0930   BILITOT 0.2 08/23/2024 1123   BILITOT 0.2 07/26/2024 0930       There were no vitals filed for this visit. There were no  vitals filed for this visit. Other relevant data reviewed during this visit included CBC, CA125, TSH, iron studies, CT imaging

## 2024-09-13 NOTE — Patient Instructions (Signed)
 [facility] - A DEPT OF [facility]  Discharge Instructions: Thank you for choosing [facility] to provide your oncology and hematology care.   If you have a lab appointment with the Cancer Center, please go directly to the Cancer Center and check in at the registration area.   Wear comfortable clothing and clothing appropriate for easy access to any Portacath or PICC line.   We strive to give you quality time with your provider. You may need to reschedule your appointment if you arrive late (15 or more minutes).  Arriving late affects you and other patients whose appointments are after yours.  Also, if you miss three or more appointments without notifying the office, you may be dismissed from the clinic at the provider's discretion.      For prescription refill requests, have your pharmacy contact our office and allow 72 hours for refills to be completed.    Today you received the following chemotherapy and/or immunotherapy agents: bevacizumab      To help prevent nausea and vomiting after your treatment, we encourage you to take your nausea medication as directed.  BELOW ARE SYMPTOMS THAT SHOULD BE REPORTED IMMEDIATELY: *FEVER GREATER THAN 100.4 F (38 C) OR HIGHER *CHILLS OR SWEATING *NAUSEA AND VOMITING THAT IS NOT CONTROLLED WITH YOUR NAUSEA MEDICATION *UNUSUAL SHORTNESS OF BREATH *UNUSUAL BRUISING OR BLEEDING *URINARY PROBLEMS (pain or burning when urinating, or frequent urination) *BOWEL PROBLEMS (unusual diarrhea, constipation, pain near the anus) TENDERNESS IN MOUTH AND THROAT WITH OR WITHOUT PRESENCE OF ULCERS (sore throat, sores in mouth, or a toothache) UNUSUAL RASH, SWELLING OR PAIN  UNUSUAL VAGINAL DISCHARGE OR ITCHING   Items with * indicate a potential emergency and should be followed up as soon as possible or go to the Emergency Department if any problems should occur.  Please show the CHEMOTHERAPY ALERT CARD or  IMMUNOTHERAPY ALERT CARD at check-in to the Emergency Department and triage nurse.  Should you have questions after your visit or need to cancel or reschedule your appointment, please contact [facility]  Dept: [phone]  and follow the prompts.  Office hours are 8:00 a.m. to 4:30 p.m. Monday - Friday. Please note that voicemails left after 4:00 p.m. may not be returned until the following business day.  We are closed weekends and major holidays. You have access to a nurse at all times for urgent questions. Please call the main number to the clinic Dept: [phone] and follow the prompts.   For any non-urgent questions, you may also contact your provider using MyChart. We now offer e-Visits for anyone 81 and older to request care online for non-urgent symptoms. For details visit [email or web address].   Also download the MyChart app! Go to the app store, search "MyChart", open the app, select [place], and log in with your MyChart username and password.

## 2024-09-14 ENCOUNTER — Other Ambulatory Visit: Payer: Self-pay

## 2024-09-17 NOTE — Progress Notes (Unsigned)
 Cardiology Office Note   Date:  09/18/2024  ID:  Valeen, Lauren Chandler 01, 1967, MRN [id number] PCP: [name], FNP  [facility] Providers Cardiologist:  Lurena MARLA Red, MD   History of Present Illness Lauren Chandler is a 58 y.o. female with a past medical history of coronary calcifications on CT scan, aortic atherosclerosis, HLD, NICM, primary peritoneal adenocarcinoma. Patient followed by Dr. Red and presents today for a 6 month follow up appointment.  Patient previously underwent echocardiogram on 08/12/22 that showed EF 40-45%, normal RV systolic function, mild MR, mild-moderate AI. Underwent R/L heart catheterization on 10/28/22 that showed very mild nonobstructive of the right coronary artery and left anterior descending arteries. Recommended medical therapy.   Cecilie presents today with her spouse. She has been under a lot more stress as of recent with health concerns. She expresses high concern for her fast heart rate. Denies palpitations and SOB. Denies excess caffeine. She is currently taking the Carvedilol  3.125 mg and is unable to tell is this is helping keep her heart rate control. Her last hemoglobin was 8.7 on 09/13/24 and is up from 6.9 on 08/23/24. She is also not as active due to her medical diagnoses. She is working on increasing her activity slowly and water  intake. She is taking Crestor  5 mg daily and tolerating it well. Denies myalgias and chest pain.    Studies Reviewed EKG Interpretation Date/Time:  Tuesday September 18 2024 09:39:35 EST Ventricular Rate:  108 PR Interval:  148 QRS Duration:  102 QT Interval:  338 QTC Calculation: 452 R Axis:   -16  Text Interpretation: Sinus tachycardia Left ventricular hypertrophy with repolarization abnormality ( R in aVL , Cornell product ) When compared with ECG of 28-Oct-2022 08:15, No significant change was found Confirmed by Vicci Sauer [phone]) on 09/18/2024 12:10:32 PM   Cardiac Studies &  Procedures   ______________________________________________________________________________________________ CARDIAC CATHETERIZATION  CARDIAC CATHETERIZATION 10/28/2022  Conclusion 1.  Very mild obstructive coronary artery disease of the right coronary artery and left anterior descending arteries. 2.  Relatively normal filling pressures with a mean RA pressure of 4 mmHg, mean wedge pressure of 12 mmHg, and LVEDP of 14 mmHg with a cardiac output by Fick of 6.2 L/min and cardiac index by Fick of 3.5 L/min/m.  Recommendation: Continue medical therapy.  Findings Coronary Findings Diagnostic  Dominance: Right  Left Anterior Descending The vessel exhibits minimal luminal irregularities.  Right Coronary Artery The vessel exhibits minimal luminal irregularities.  Intervention  No interventions have been documented.     ECHOCARDIOGRAM  ECHOCARDIOGRAM COMPLETE 08/12/2022  Narrative ECHOCARDIOGRAM REPORT    Patient Name:   Lauren Chandler Date of Exam: 08/12/2022 Medical Rec #:  [id number]       Height:       63.0 in Accession #:    [id number]      Weight:       171.0 lb Date of Birth:  [birth year]        BSA:          1.809 m Patient Age:    56 years        BP:           119/63 mmHg Patient Gender: F               HR:           107 bpm. Exam Location:  [address]  Procedure: 2D Echo, Cardiac Doppler and Color Doppler  Indications:  R06.00 Dyspnea  History:        Patient has prior history of Echocardiogram examinations, most recent 03/29/2006. Coronary artery calcification seen on CAT scan. Breast cancer. Hodgkin's disease. Thyroid  disease.  Sonographer:    Jon Hacker RCS Sonographer#2:  Waldo Guadalajara RCS Referring Phys: [id number] ARUN K THUKKANI   Sonographer Comments: Image acquisition challenging due to breast implants. IMPRESSIONS   1. Left ventricular ejection fraction, by estimation, is 40 to 45%. The left ventricle has mildly decreased function. The left  ventricle demonstrates global hypokinesis. Left ventricular diastolic parameters are indeterminate. 2. Right ventricular systolic function is normal. The right ventricular size is normal. 3. The mitral valve is normal in structure. Mild mitral valve regurgitation. No evidence of mitral stenosis. 4. The aortic valve is normal in structure. Aortic valve regurgitation is mild to moderate. Aortic valve sclerosis/calcification is present, without any evidence of aortic stenosis. 5. The inferior vena cava is normal in size with greater than 50% respiratory variability, suggesting right atrial pressure of 3 mmHg.  FINDINGS Left Ventricle: Left ventricular ejection fraction, by estimation, is 40 to 45%. The left ventricle has mildly decreased function. The left ventricle demonstrates global hypokinesis. The left ventricular internal cavity size was normal in size. There is no left ventricular hypertrophy. Left ventricular diastolic parameters are indeterminate.  Right Ventricle: The right ventricular size is normal. No increase in right ventricular wall thickness. Right ventricular systolic function is normal.  Left Atrium: Left atrial size was normal in size.  Right Atrium: Right atrial size was normal in size.  Pericardium: There is no evidence of pericardial effusion.  Mitral Valve: The mitral valve is normal in structure. Mild mitral annular calcification. Mild mitral valve regurgitation. No evidence of mitral valve stenosis.  Tricuspid Valve: The tricuspid valve is normal in structure. Tricuspid valve regurgitation is mild . No evidence of tricuspid stenosis.  Aortic Valve: The aortic valve is normal in structure. There is mild to moderate aortic valve annular calcification. Aortic valve regurgitation is mild to moderate. Aortic valve sclerosis/calcification is present, without any evidence of aortic stenosis.  Pulmonic Valve: The pulmonic valve was not well visualized. Pulmonic valve  regurgitation is trivial. No evidence of pulmonic stenosis.  Aorta: The aortic root is normal in size and structure.  Venous: The inferior vena cava is normal in size with greater than 50% respiratory variability, suggesting right atrial pressure of 3 mmHg.  IAS/Shunts: No atrial level shunt detected by color flow Doppler.   LEFT VENTRICLE PLAX 2D LVIDd:         4.60 cm   Diastology LVIDs:         3.90 cm   LV e' medial:    5.77 cm/s LV PW:         0.80 cm   LV E/e' medial:  12.5 LV IVS:        0.80 cm   LV e' lateral:   5.22 cm/s LVOT diam:     2.10 cm   LV E/e' lateral: 13.9 LV SV:         43 LV SV Index:   24 LVOT Area:     3.46 cm   RIGHT VENTRICLE RV S prime:     10.90 cm/s TAPSE (M-mode): 1.8 cm  LEFT ATRIUM             Index LA Vol (A2C):   22.6 ml 12.49 ml/m LA Vol (A4C):   20.5 ml 11.33 ml/m LA Biplane Vol: 22.7 ml 12.55  ml/m AORTIC VALVE LVOT Vmax:   80.70 cm/s LVOT Vmean:  54.000 cm/s LVOT VTI:    0.123 m  AORTA Ao Root diam: 3.10 cm  MITRAL VALVE MV Area (PHT): 5.62 cm    SHUNTS MV Decel Time: 135 msec    Systemic VTI:  0.12 m MV E velocity: 72.30 cm/s  Systemic Diam: 2.10 cm MV A velocity: 94.30 cm/s MV E/A ratio:  0.77  Kardie Tobb DO Electronically signed by Dub Huntsman DO Signature Date/Time: 08/12/2022/2:40:59 PM    Final          ______________________________________________________________________________________________       Risk Assessment/Calculations           Physical Exam VS:  BP 118/80   Pulse 100   Ht 5' 2 (1.575 m)   Wt 163 lb 6.4 oz (74.1 kg)   SpO2 94%   BMI 29.89 kg/m        Wt Readings from Last 3 Encounters:  09/18/24 163 lb 6.4 oz (74.1 kg)  09/13/24 165 lb (74.8 kg)  08/23/24 166 lb 6.4 oz (75.5 kg)    GEN: Well nourished, well developed in no acute distress NECK: No JVD; No carotid bruits CARDIAC: RRR, no murmurs, rubs, gallops RESPIRATORY:  Clear to auscultation without rales, wheezing or  rhonchi  ABDOMEN: Soft, non-tender, non-distended EXTREMITIES:  No edema; No deformity   ASSESSMENT AND PLAN  Nonischemic cardiomyopathy  - Echocardiogram from 07/2022 showed EF 40-45%, normal RV size and systolic function, mild MR, mild-moderate AI  - Cath in 10/2022 showed very mild nonobstructive disease  - Continue carvedilol  3.125 mg BID - In the past, patient has preferred to hold off on adding GDMT until repeat echocardiogram. Repeat echocardiogram was canceled in April. Ordered repeat echo today.  - Hesitant to initiate further GDMT d/t hypotension in the past and current treatments.   Sinus Tachycardia  - EKG today showed sinus tachycardia with no other concerning findings  - She is agreeable to a heart monitor x3 days to see how heart rate is responding to carvedilol .  - A lot of anxiety contributing to heart rate. Patient reassured.  - Discussed tachycardia could be related to anemia, dehydration, and anxiety. Partner is on board with helping to reduce anxiety.  - Last hemoglobin was 8.7 on 09/13/24 and is up from 6.9 on 08/23/24. Suspect this is contributing.  - Denies SOB and chest pain. TSH 09/13/24 and CMP 08/23/24 within normal limits.   Coronary artery calcifications  - CT calcium  scoring in 05/2022 showed a coronary calcium  score of 50.2 (90th percentile)  - Cath in 10/2022 showed mild nonobstructive disease - Denies chest pain, DOE  - Continue ASA 81 mg daily  - Increase crestor  to 10 mg daily   HTN  - She was previously on Losartan  12.5 mg and was unable to tolerate d/t passing out. - Continue carvedilol  3.125 mg BID  - BP well controlled   HLD  - Lipid panel from 03/2024 showed LDL 122, HDL 55, triglycerides 158, total cholesterol 209  - Increase crestor  to 10 mg daily.  - Ordered repeat lipids, LFTs in 8 weeks   Mild MR  Mild-moderate AI  - Noted on echocardiogram 07/2022  - Repeating echo as above   Primary peritoneal adenocarcinoma  - Followed by  oncology        Dispo: Follow up in 4 weeks with APP.   Signed, Mardy KATHEE Pizza, FNP

## 2024-09-18 ENCOUNTER — Ambulatory Visit: Attending: Cardiology

## 2024-09-18 ENCOUNTER — Other Ambulatory Visit (HOSPITAL_COMMUNITY): Payer: Self-pay

## 2024-09-18 ENCOUNTER — Encounter: Payer: Self-pay | Admitting: Cardiology

## 2024-09-18 ENCOUNTER — Ambulatory Visit

## 2024-09-18 VITALS — BP 118/80 | HR 100 | Ht 62.0 in | Wt 163.4 lb

## 2024-09-18 DIAGNOSIS — I2510 Atherosclerotic heart disease of native coronary artery without angina pectoris: Secondary | ICD-10-CM | POA: Diagnosis not present

## 2024-09-18 DIAGNOSIS — R000 Tachycardia, unspecified: Secondary | ICD-10-CM | POA: Diagnosis not present

## 2024-09-18 DIAGNOSIS — I5020 Unspecified systolic (congestive) heart failure: Secondary | ICD-10-CM | POA: Diagnosis not present

## 2024-09-18 DIAGNOSIS — E785 Hyperlipidemia, unspecified: Secondary | ICD-10-CM | POA: Diagnosis not present

## 2024-09-18 DIAGNOSIS — C482 Malignant neoplasm of peritoneum, unspecified: Secondary | ICD-10-CM

## 2024-09-18 DIAGNOSIS — I10 Essential (primary) hypertension: Secondary | ICD-10-CM

## 2024-09-18 MED ORDER — ROSUVASTATIN CALCIUM 10 MG PO TABS
10.0000 mg | ORAL_TABLET | Freq: Every day | ORAL | 3 refills | Status: AC
  Filled 2024-09-18: qty 60, 60d supply, fill #0

## 2024-09-18 NOTE — Patient Instructions (Addendum)
 Thank you for choosing [facility]!     Medication Instructions:  Increase the Crestor  to 10mg . Take one tablet daily.   *If you need a refill on your cardiac medications before your next appointment, please call your pharmacy*   Lab Work: No labs were ordered during today's visit.  If you have labs (blood work) drawn today and your tests are completely normal, you will receive your results only by: MyChart Message (if you have MyChart) OR A paper copy in the mail If you have any lab test that is abnormal or we need to change your treatment, we will call you to review the results.   Testing/Procedures: Your physician has requested that you have an echocardiogram. Echocardiography is a painless test that uses sound waves to create images of your heart. It provides your doctor with information about the size and shape of your heart and how well your heart's chambers and valves are working. This procedure takes approximately one hour. There are no restrictions for this procedure. Please do NOT wear cologne, perfume, aftershave, or lotions (deodorant is allowed). Please arrive 15 minutes prior to your appointment time.  Please note: We ask at that you not bring children with you during ultrasound (echo/ vascular) testing. Due to room size and safety concerns, children are not allowed in the ultrasound rooms during exams. Our front office staff cannot provide observation of children in our lobby area while testing is being conducted. An adult accompanying a patient to their appointment will only be allowed in the ultrasound room at the discretion of the ultrasound technician under special circumstances. We apologize for any inconvenience.   ZIO XT- Long Term Monitor Instructions   Your physician has requested you wear your ZIO patch monitor 3 days.   This is a single patch monitor.  [employer] supplies one patch monitor per enrollment.  Additional stickers are not available.   Please  do not apply patch if you will be having a Nuclear Stress Test, Echocardiogram, Cardiac CT, MRI, or Chest Xray during the time frame you would be wearing the monitor. The patch cannot be worn during these tests.  You cannot remove and re-apply the ZIO XT patch monitor.   Your ZIO patch monitor will be sent USPS Priority mail from [facility] directly to your home address. The monitor may also be mailed to a PO BOX if home delivery is not available.   It may take 3-5 days to receive your monitor after you have been enrolled.   Once you have received you monitor, please review enclosed instructions.  Your monitor has already been registered assigning a specific monitor serial # to you.   Applying the monitor   Shave hair from upper left chest.   Hold abrader disc by orange tab.  Rub abrader in 40 strokes over left upper chest as indicated in your monitor instructions.   Clean area with 4 enclosed alcohol  pads .  Use all pads to assure are is cleaned thoroughly.  Let dry.   Apply patch as indicated in monitor instructions.  Patch will be place under collarbone on left side of chest with arrow pointing upward.   Rub patch adhesive wings for 2 minutes.Remove white label marked 1.  Remove white label marked 2.  Rub patch adhesive wings for 2 additional minutes.   While looking in a mirror, press and release button in center of patch.  A small green light will flash 3-4 times .  This will be your  only indicator the monitor has been turned on.     Do not shower for the first 24 hours.  You may shower after the first 24 hours.   Press button if you feel a symptom. You will hear a small click.  Record Date, Time and Symptom in the Patient Log Book.   When you are ready to remove patch, follow instructions on last 2 pages of Patient Log Book.  Stick patch monitor onto last page of Patient Log Book.   Place Patient Log Book in [place] box.  Use locking tab on box and tape box closed securely.   The [employer] has [employer] on it.  Please place in mailbox as soon as possible.  Your physician should have your test results approximately 7 days after the monitor has been mailed back to [facility].   Call [facility] Customer Care at [phone] if you have questions regarding your ZIO XT patch monitor.  Call them immediately if you see an orange light blinking on your monitor.   If your monitor falls off in less than 4 days contact our Monitor department at [phone].  If your monitor becomes loose or falls off after 4 days call [employer] at [phone] for suggestions on securing your monitor.    Your next appointment:   4 week(s)   Provider:   Any available APP     Follow-Up: At [facility], you and your health needs are our priority.  As part of our continuing mission to provide you with exceptional heart care, we have created designated Provider Care Teams.  These Care Teams include your primary Cardiologist (physician) and Advanced Practice Providers (APPs -  Physician Assistants and Nurse Practitioners) who all work together to provide you with the care you need, when you need it. We recommend signing up for the patient portal called MyChart.  Sign up information is provided on this After Visit Summary.  MyChart is used to connect with patients for Virtual Visits (Telemedicine).  Patients are able to view lab/test results, encounter notes, upcoming appointments, etc.  Non-urgent messages can be sent to your provider as well.   To learn more about what you can do with MyChart, go to [email or web address].

## 2024-09-18 NOTE — Progress Notes (Unsigned)
 ZIO serial # [id number] from office inventory applied to patient. Dr. Wendel to read.

## 2024-09-19 ENCOUNTER — Other Ambulatory Visit: Payer: Self-pay

## 2024-09-26 DIAGNOSIS — R000 Tachycardia, unspecified: Secondary | ICD-10-CM | POA: Diagnosis not present

## 2024-09-27 ENCOUNTER — Ambulatory Visit: Payer: Self-pay | Admitting: Cardiology

## 2024-09-27 DIAGNOSIS — R000 Tachycardia, unspecified: Secondary | ICD-10-CM | POA: Diagnosis not present

## 2024-09-27 DIAGNOSIS — Q8901 Asplenia (congenital): Secondary | ICD-10-CM | POA: Diagnosis not present

## 2024-09-27 DIAGNOSIS — R350 Frequency of micturition: Secondary | ICD-10-CM | POA: Diagnosis not present

## 2024-09-27 DIAGNOSIS — R399 Unspecified symptoms and signs involving the genitourinary system: Secondary | ICD-10-CM | POA: Diagnosis not present

## 2024-10-04 ENCOUNTER — Other Ambulatory Visit: Payer: Self-pay | Admitting: Hematology and Oncology

## 2024-10-04 ENCOUNTER — Inpatient Hospital Stay: Admitting: Hematology and Oncology

## 2024-10-04 ENCOUNTER — Inpatient Hospital Stay

## 2024-10-04 ENCOUNTER — Inpatient Hospital Stay: Attending: Gynecologic Oncology

## 2024-10-04 ENCOUNTER — Encounter: Payer: Self-pay | Admitting: Hematology and Oncology

## 2024-10-04 VITALS — BP 126/64 | HR 100 | Temp 97.4°F | Resp 17 | Ht 62.0 in | Wt 166.0 lb

## 2024-10-04 DIAGNOSIS — Z853 Personal history of malignant neoplasm of breast: Secondary | ICD-10-CM | POA: Insufficient documentation

## 2024-10-04 DIAGNOSIS — T451X5A Adverse effect of antineoplastic and immunosuppressive drugs, initial encounter: Secondary | ICD-10-CM

## 2024-10-04 DIAGNOSIS — Z8571 Personal history of Hodgkin lymphoma: Secondary | ICD-10-CM | POA: Insufficient documentation

## 2024-10-04 DIAGNOSIS — C482 Malignant neoplasm of peritoneum, unspecified: Secondary | ICD-10-CM | POA: Diagnosis not present

## 2024-10-04 DIAGNOSIS — R809 Proteinuria, unspecified: Secondary | ICD-10-CM | POA: Diagnosis not present

## 2024-10-04 DIAGNOSIS — D6481 Anemia due to antineoplastic chemotherapy: Secondary | ICD-10-CM | POA: Insufficient documentation

## 2024-10-04 DIAGNOSIS — R7989 Other specified abnormal findings of blood chemistry: Secondary | ICD-10-CM | POA: Insufficient documentation

## 2024-10-04 DIAGNOSIS — Z5112 Encounter for antineoplastic immunotherapy: Secondary | ICD-10-CM | POA: Insufficient documentation

## 2024-10-04 DIAGNOSIS — Z79899 Other long term (current) drug therapy: Secondary | ICD-10-CM | POA: Diagnosis not present

## 2024-10-04 DIAGNOSIS — R300 Dysuria: Secondary | ICD-10-CM

## 2024-10-04 LAB — COMPREHENSIVE METABOLIC PANEL WITH GFR
ALT: 16 U/L (ref 0–44)
AST: 36 U/L (ref 15–41)
Albumin: 3.7 g/dL (ref 3.5–5.0)
Alkaline Phosphatase: 77 U/L (ref 38–126)
Anion gap: 11 (ref 5–15)
BUN: 17 mg/dL (ref 6–20)
CO2: 26 mmol/L (ref 22–32)
Calcium: 9.3 mg/dL (ref 8.9–10.3)
Chloride: 97 mmol/L — ABNORMAL LOW (ref 98–111)
Creatinine, Ser: 1.12 mg/dL — ABNORMAL HIGH (ref 0.44–1.00)
GFR, Estimated: 57 mL/min — ABNORMAL LOW (ref >60)
Glucose, Bld: 133 mg/dL — ABNORMAL HIGH (ref 70–99)
Potassium: 4.2 mmol/L (ref 3.5–5.1)
Sodium: 134 mmol/L — ABNORMAL LOW (ref 135–145)
Total Bilirubin: 0.3 mg/dL (ref 0.0–1.2)
Total Protein: 8 g/dL (ref 6.5–8.1)

## 2024-10-04 LAB — CBC WITH DIFFERENTIAL (CANCER CENTER ONLY)
Abs Immature Granulocytes: 0.03 K/uL (ref 0.00–0.07)
Basophils Absolute: 0.1 K/uL (ref 0.0–0.1)
Basophils Relative: 1 %
Eosinophils Absolute: 0.1 K/uL (ref 0.0–0.5)
Eosinophils Relative: 2 %
HCT: 26.2 % — ABNORMAL LOW (ref 36.0–46.0)
Hemoglobin: 8.3 g/dL — ABNORMAL LOW (ref 12.0–15.0)
Immature Granulocytes: 0 %
Lymphocytes Relative: 27 %
Lymphs Abs: 2.1 K/uL (ref 0.7–4.0)
MCH: 32.4 pg (ref 26.0–34.0)
MCHC: 31.7 g/dL (ref 30.0–36.0)
MCV: 102.3 fL — ABNORMAL HIGH (ref 80.0–100.0)
Monocytes Absolute: 0.7 K/uL (ref 0.1–1.0)
Monocytes Relative: 9 %
Neutro Abs: 4.6 K/uL (ref 1.7–7.7)
Neutrophils Relative %: 61 %
Platelet Count: 381 K/uL (ref 150–400)
RBC: 2.56 MIL/uL — ABNORMAL LOW (ref 3.87–5.11)
RDW: 19.3 % — ABNORMAL HIGH (ref 11.5–15.5)
WBC Count: 7.5 K/uL (ref 4.0–10.5)
nRBC: 0.3 % — ABNORMAL HIGH (ref 0.0–0.2)

## 2024-10-04 LAB — SAMPLE TO BLOOD BANK

## 2024-10-04 LAB — TOTAL PROTEIN, URINE DIPSTICK: Protein, ur: 30 mg/dL — AB

## 2024-10-04 MED ORDER — SODIUM CHLORIDE 0.9 % IV SOLN
5.0000 mg/kg | Freq: Once | INTRAVENOUS | Status: AC
  Administered 2024-10-04: 400 mg via INTRAVENOUS
  Filled 2024-10-04: qty 16

## 2024-10-04 MED ORDER — LIDOCAINE-PRILOCAINE 2.5-2.5 % EX CREA: 1.0000 | TOPICAL_CREAM | CUTANEOUS | 3 refills | Status: AC | PRN

## 2024-10-04 NOTE — Progress Notes (Signed)
 [facility] OFFICE PROGRESS NOTE  Patient Care Team: [name], FNP as PCP - General (Internal Medicine) [name], MD as PCP - Cardiology (Cardiology) [name], MD as Consulting Physician (Gastroenterology)  Assessment & Plan Primary peritoneal adenocarcinoma [facility]) She has remote history of Hodgkin lymphoma and breast cancer She was diagnosed with stage III primary peritoneal cancer in 2023 treated with neoadjuvant chemotherapy followed by surgery and completion of chemotherapy by March 2024 Pathology high-grade serous carcinoma, ER positive, p53 mutated, BRCA2 positive CARIS molecular testing, HRD positive, BRCA to positive, PD-L1 CPS score 5%, p53 mutation positive, MSI stable, low tumor mutation burden, BRAF negative, ER 50% positive, folate receptor 1 negative, 2+, 35%, HER2/neu 0 She progressed on olaparib  maintenance treatment  I reviewed CT imaging from November 2025 in comparison with CT imaging from June which showed positive response to therapy I reviewed changes on the CT with her today She will continue maintenance bevacizumab  Next imaging study would be in February 2026  Proteinuria, unspecified type Repeat urine protein is stable Will proceed with low-dose bevacizumab  Elevated serum creatinine She was seen by nephrologist recently She will complete further workup as directed Anemia due to antineoplastic chemotherapy She has multifactorial anemia, signs of iron deficiency I recommend the patient to start taking oral iron supplement and she agrees  No orders of the defined types were placed in this encounter.    Almarie Bedford, MD  INTERVAL HISTORY: she returns for treatment follow-up Complications related to previous cycle of chemotherapy included anemia, and elevated serum creatinine  PHYSICAL EXAMINATION: ECOG PERFORMANCE STATUS: 0 - Asymptomatic  Lab Results  Component Value Date   CAN125 33.5 08/23/2024   CAN125 49.0 (H)  07/05/2024   CAN125 150.0 (H) 05/03/2024      Latest Ref Rng & Units 10/04/2024   10:41 AM 09/13/2024    8:29 AM 08/23/2024   12:12 PM  CBC  WBC 4.0 - 10.5 K/uL 7.5  7.7  4.8   Hemoglobin 12.0 - 15.0 g/dL 8.3  8.7  7.0   Hematocrit 36.0 - 46.0 % 26.2  26.5  21.1   Platelets 150 - 400 K/uL 381  345  159       Chemistry      Component Value Date/Time   NA 134 (L) 10/04/2024 1041   K 4.2 10/04/2024 1041   CL 97 (L) 10/04/2024 1041   CO2 26 10/04/2024 1041   BUN 17 10/04/2024 1041   CREATININE 1.12 (H) 10/04/2024 1041   CREATININE 1.03 (H) 07/26/2024 0930      Component Value Date/Time   CALCIUM  9.3 10/04/2024 1041   ALKPHOS 77 10/04/2024 1041   AST 36 10/04/2024 1041   AST 18 07/26/2024 0930   ALT 16 10/04/2024 1041   ALT 17 07/26/2024 0930   BILITOT 0.3 10/04/2024 1041   BILITOT 0.2 07/26/2024 0930       Vitals:   10/04/24 1110  BP: 126/64  Pulse: 100  Resp: 17  Temp: (!) 97.4 F (36.3 C)  SpO2: 100%   Filed Weights   10/04/24 1110  Weight: 166 lb (75.3 kg)   Other relevant data reviewed during this visit included CBC, CMP, urine protein, CT imaging from November 2025

## 2024-10-04 NOTE — Assessment & Plan Note (Addendum)
 She has remote history of Hodgkin lymphoma and breast cancer She was diagnosed with stage III primary peritoneal cancer in 2023 treated with neoadjuvant chemotherapy followed by surgery and completion of chemotherapy by March 2024 Pathology high-grade serous carcinoma, ER positive, p53 mutated, BRCA2 positive CARIS molecular testing, HRD positive, BRCA to positive, PD-L1 CPS score 5%, p53 mutation positive, MSI stable, low tumor mutation burden, BRAF negative, ER 50% positive, folate receptor 1 negative, 2+, 35%, HER2/neu 0 She progressed on olaparib  maintenance treatment  I reviewed CT imaging from November 2025 in comparison with CT imaging from June which showed positive response to therapy I reviewed changes on the CT with her today She will continue maintenance bevacizumab  Next imaging study would be in February 2026

## 2024-10-04 NOTE — Assessment & Plan Note (Addendum)
 She has multifactorial anemia, signs of iron deficiency I recommend the patient to start taking oral iron supplement and she agrees

## 2024-10-04 NOTE — Patient Instructions (Signed)
 [facility] - A DEPT OF [facility]  Discharge Instructions: Thank you for choosing [facility] to provide your oncology and hematology care.   If you have a lab appointment with the Cancer Center, please go directly to the Cancer Center and check in at the registration area.   Wear comfortable clothing and clothing appropriate for easy access to any Portacath or PICC line.   We strive to give you quality time with your provider. You may need to reschedule your appointment if you arrive late (15 or more minutes).  Arriving late affects you and other patients whose appointments are after yours.  Also, if you miss three or more appointments without notifying the office, you may be dismissed from the clinic at the provider's discretion.      For prescription refill requests, have your pharmacy contact our office and allow 72 hours for refills to be completed.    Today you received the following chemotherapy and/or immunotherapy agents: bevacizumab      To help prevent nausea and vomiting after your treatment, we encourage you to take your nausea medication as directed.  BELOW ARE SYMPTOMS THAT SHOULD BE REPORTED IMMEDIATELY: *FEVER GREATER THAN 100.4 F (38 C) OR HIGHER *CHILLS OR SWEATING *NAUSEA AND VOMITING THAT IS NOT CONTROLLED WITH YOUR NAUSEA MEDICATION *UNUSUAL SHORTNESS OF BREATH *UNUSUAL BRUISING OR BLEEDING *URINARY PROBLEMS (pain or burning when urinating, or frequent urination) *BOWEL PROBLEMS (unusual diarrhea, constipation, pain near the anus) TENDERNESS IN MOUTH AND THROAT WITH OR WITHOUT PRESENCE OF ULCERS (sore throat, sores in mouth, or a toothache) UNUSUAL RASH, SWELLING OR PAIN  UNUSUAL VAGINAL DISCHARGE OR ITCHING   Items with * indicate a potential emergency and should be followed up as soon as possible or go to the Emergency Department if any problems should occur.  Please show the CHEMOTHERAPY ALERT CARD or  IMMUNOTHERAPY ALERT CARD at check-in to the Emergency Department and triage nurse.  Should you have questions after your visit or need to cancel or reschedule your appointment, please contact [facility]  Dept: [phone]  and follow the prompts.  Office hours are 8:00 a.m. to 4:30 p.m. Monday - Friday. Please note that voicemails left after 4:00 p.m. may not be returned until the following business day.  We are closed weekends and major holidays. You have access to a nurse at all times for urgent questions. Please call the main number to the clinic Dept: [phone] and follow the prompts.   For any non-urgent questions, you may also contact your provider using MyChart. We now offer e-Visits for anyone 81 and older to request care online for non-urgent symptoms. For details visit [email or web address].   Also download the MyChart app! Go to the app store, search "MyChart", open the app, select [place], and log in with your MyChart username and password.

## 2024-10-04 NOTE — Assessment & Plan Note (Addendum)
 She was seen by nephrologist recently She will complete further workup as directed

## 2024-10-04 NOTE — Assessment & Plan Note (Addendum)
 Repeat urine protein is stable Will proceed with low-dose bevacizumab 

## 2024-10-05 LAB — CA 125: Cancer Antigen (CA) 125: 215 U/mL — ABNORMAL HIGH (ref 0.0–38.1)

## 2024-10-26 ENCOUNTER — Other Ambulatory Visit: Payer: Self-pay

## 2024-10-26 ENCOUNTER — Inpatient Hospital Stay: Attending: Gynecologic Oncology

## 2024-10-26 ENCOUNTER — Inpatient Hospital Stay

## 2024-10-26 ENCOUNTER — Ambulatory Visit (HOSPITAL_BASED_OUTPATIENT_CLINIC_OR_DEPARTMENT_OTHER)
Admission: RE | Admit: 2024-10-26 | Discharge: 2024-10-26 | Disposition: A | Discharge status: Home / Self Care | Source: Ambulatory Visit | Attending: Cardiology | Admitting: Cardiology

## 2024-10-26 ENCOUNTER — Inpatient Hospital Stay: Admitting: Hematology and Oncology

## 2024-10-26 VITALS — HR 110

## 2024-10-26 VITALS — BP 106/59 | HR 116 | Resp 18 | Ht 62.0 in | Wt 167.6 lb

## 2024-10-26 DIAGNOSIS — D6481 Anemia due to antineoplastic chemotherapy: Secondary | ICD-10-CM | POA: Insufficient documentation

## 2024-10-26 DIAGNOSIS — Z170 Estrogen receptor positive status [ER+]: Secondary | ICD-10-CM | POA: Insufficient documentation

## 2024-10-26 DIAGNOSIS — C482 Malignant neoplasm of peritoneum, unspecified: Secondary | ICD-10-CM | POA: Insufficient documentation

## 2024-10-26 DIAGNOSIS — Z1509 Genetic susceptibility to other malignant neoplasm: Secondary | ICD-10-CM | POA: Insufficient documentation

## 2024-10-26 DIAGNOSIS — Z5112 Encounter for antineoplastic immunotherapy: Secondary | ICD-10-CM | POA: Insufficient documentation

## 2024-10-26 DIAGNOSIS — R140 Abdominal distension (gaseous): Secondary | ICD-10-CM | POA: Insufficient documentation

## 2024-10-26 DIAGNOSIS — I952 Hypotension due to drugs: Secondary | ICD-10-CM | POA: Insufficient documentation

## 2024-10-26 DIAGNOSIS — Z79630 Long term (current) use of alkylating agent: Secondary | ICD-10-CM | POA: Insufficient documentation

## 2024-10-26 DIAGNOSIS — Z853 Personal history of malignant neoplasm of breast: Secondary | ICD-10-CM | POA: Insufficient documentation

## 2024-10-26 DIAGNOSIS — Z79899 Other long term (current) drug therapy: Secondary | ICD-10-CM | POA: Insufficient documentation

## 2024-10-26 DIAGNOSIS — Z1501 Genetic susceptibility to malignant neoplasm of breast: Secondary | ICD-10-CM | POA: Insufficient documentation

## 2024-10-26 DIAGNOSIS — T451X5A Adverse effect of antineoplastic and immunosuppressive drugs, initial encounter: Secondary | ICD-10-CM | POA: Insufficient documentation

## 2024-10-26 DIAGNOSIS — Z8571 Personal history of Hodgkin lymphoma: Secondary | ICD-10-CM | POA: Insufficient documentation

## 2024-10-26 DIAGNOSIS — I5020 Unspecified systolic (congestive) heart failure: Secondary | ICD-10-CM | POA: Insufficient documentation

## 2024-10-26 DIAGNOSIS — R000 Tachycardia, unspecified: Secondary | ICD-10-CM | POA: Insufficient documentation

## 2024-10-26 DIAGNOSIS — R112 Nausea with vomiting, unspecified: Secondary | ICD-10-CM | POA: Insufficient documentation

## 2024-10-26 DIAGNOSIS — Z79631 Long term (current) use of antimetabolite agent: Secondary | ICD-10-CM | POA: Insufficient documentation

## 2024-10-26 DIAGNOSIS — R180 Malignant ascites: Secondary | ICD-10-CM | POA: Insufficient documentation

## 2024-10-26 DIAGNOSIS — R300 Dysuria: Secondary | ICD-10-CM

## 2024-10-26 DIAGNOSIS — R5383 Other fatigue: Secondary | ICD-10-CM | POA: Insufficient documentation

## 2024-10-26 LAB — COMPREHENSIVE METABOLIC PANEL WITH GFR
ALT: 15 U/L (ref 0–44)
AST: 51 U/L — ABNORMAL HIGH (ref 15–41)
Albumin: 3.7 g/dL (ref 3.5–5.0)
Alkaline Phosphatase: 78 U/L (ref 38–126)
Anion gap: 14 (ref 5–15)
BUN: 14 mg/dL (ref 6–20)
CO2: 25 mmol/L (ref 22–32)
Calcium: 9.3 mg/dL (ref 8.9–10.3)
Chloride: 97 mmol/L — ABNORMAL LOW (ref 98–111)
Creatinine, Ser: 1.06 mg/dL — ABNORMAL HIGH (ref 0.44–1.00)
GFR, Estimated: >60 mL/min
Glucose, Bld: 127 mg/dL — ABNORMAL HIGH (ref 70–99)
Potassium: 4.1 mmol/L (ref 3.5–5.1)
Sodium: 135 mmol/L (ref 135–145)
Total Bilirubin: 0.3 mg/dL (ref 0.0–1.2)
Total Protein: 8.5 g/dL — ABNORMAL HIGH (ref 6.5–8.1)

## 2024-10-26 LAB — TOTAL PROTEIN, URINE DIPSTICK: Protein, ur: 30 mg/dL — AB

## 2024-10-26 LAB — CBC WITH DIFFERENTIAL (CANCER CENTER ONLY)
Abs Immature Granulocytes: 0.03 K/uL (ref 0.00–0.07)
Basophils Absolute: 0.1 K/uL (ref 0.0–0.1)
Basophils Relative: 1 %
Eosinophils Absolute: 0.3 K/uL (ref 0.0–0.5)
Eosinophils Relative: 4 %
HCT: 29.4 % — ABNORMAL LOW (ref 36.0–46.0)
Hemoglobin: 9.6 g/dL — ABNORMAL LOW (ref 12.0–15.0)
Immature Granulocytes: 0 %
Lymphocytes Relative: 25 %
Lymphs Abs: 2 K/uL (ref 0.7–4.0)
MCH: 32.4 pg (ref 26.0–34.0)
MCHC: 32.7 g/dL (ref 30.0–36.0)
MCV: 99.3 fL (ref 80.0–100.0)
Monocytes Absolute: 1 K/uL (ref 0.1–1.0)
Monocytes Relative: 12 %
Neutro Abs: 4.6 K/uL (ref 1.7–7.7)
Neutrophils Relative %: 58 %
Platelet Count: 438 K/uL — ABNORMAL HIGH (ref 150–400)
RBC: 2.96 MIL/uL — ABNORMAL LOW (ref 3.87–5.11)
RDW: 18.4 % — ABNORMAL HIGH (ref 11.5–15.5)
WBC Count: 8 K/uL (ref 4.0–10.5)
nRBC: 0 % (ref 0.0–0.2)

## 2024-10-26 LAB — SAMPLE TO BLOOD BANK

## 2024-10-26 LAB — ECHOCARDIOGRAM COMPLETE
Height: 62 in
S' Lateral: 3.7 cm
Weight: 2681.6 [oz_av]

## 2024-10-26 LAB — MAGNESIUM: Magnesium: 1.2 mg/dL — ABNORMAL LOW (ref 1.7–2.4)

## 2024-10-26 MED ORDER — SODIUM CHLORIDE 0.9 % IV SOLN
5.0000 mg/kg | Freq: Once | INTRAVENOUS | Status: AC
  Administered 2024-10-26: 400 mg via INTRAVENOUS
  Filled 2024-10-26: qty 16

## 2024-10-26 MED ORDER — SODIUM CHLORIDE 0.9 % IV SOLN: INTRAVENOUS | Status: DC

## 2024-10-26 NOTE — Patient Instructions (Signed)
 [facility] - A DEPT OF [facility]  Discharge Instructions: Thank you for choosing [facility] to provide your oncology and hematology care.   If you have a lab appointment with the Cancer Center, please go directly to the Cancer Center and check in at the registration area.   Wear comfortable clothing and clothing appropriate for easy access to any Portacath or PICC line.   We strive to give you quality time with your provider. You may need to reschedule your appointment if you arrive late (15 or more minutes).  Arriving late affects you and other patients whose appointments are after yours.  Also, if you miss three or more appointments without notifying the office, you may be dismissed from the clinic at the provider's discretion.      For prescription refill requests, have your pharmacy contact our office and allow 72 hours for refills to be completed.    Today you received the following chemotherapy and/or immunotherapy agents: bevacizumab      To help prevent nausea and vomiting after your treatment, we encourage you to take your nausea medication as directed.  BELOW ARE SYMPTOMS THAT SHOULD BE REPORTED IMMEDIATELY: *FEVER GREATER THAN 100.4 F (38 C) OR HIGHER *CHILLS OR SWEATING *NAUSEA AND VOMITING THAT IS NOT CONTROLLED WITH YOUR NAUSEA MEDICATION *UNUSUAL SHORTNESS OF BREATH *UNUSUAL BRUISING OR BLEEDING *URINARY PROBLEMS (pain or burning when urinating, or frequent urination) *BOWEL PROBLEMS (unusual diarrhea, constipation, pain near the anus) TENDERNESS IN MOUTH AND THROAT WITH OR WITHOUT PRESENCE OF ULCERS (sore throat, sores in mouth, or a toothache) UNUSUAL RASH, SWELLING OR PAIN  UNUSUAL VAGINAL DISCHARGE OR ITCHING   Items with * indicate a potential emergency and should be followed up as soon as possible or go to the Emergency Department if any problems should occur.  Please show the CHEMOTHERAPY ALERT CARD or  IMMUNOTHERAPY ALERT CARD at check-in to the Emergency Department and triage nurse.  Should you have questions after your visit or need to cancel or reschedule your appointment, please contact [facility]  Dept: [phone]  and follow the prompts.  Office hours are 8:00 a.m. to 4:30 p.m. Monday - Friday. Please note that voicemails left after 4:00 p.m. may not be returned until the following business day.  We are closed weekends and major holidays. You have access to a nurse at all times for urgent questions. Please call the main number to the clinic Dept: [phone] and follow the prompts.   For any non-urgent questions, you may also contact your provider using MyChart. We now offer e-Visits for anyone 81 and older to request care online for non-urgent symptoms. For details visit [email or web address].   Also download the MyChart app! Go to the app store, search "MyChart", open the app, select [place], and log in with your MyChart username and password.

## 2024-10-27 ENCOUNTER — Encounter: Payer: Self-pay | Admitting: Hematology and Oncology

## 2024-10-27 LAB — CA 125: Cancer Antigen (CA) 125: 288 U/mL — ABNORMAL HIGH (ref 0.0–38.1)

## 2024-10-27 NOTE — Progress Notes (Signed)
 [facility] OFFICE PROGRESS NOTE  Patient Care Team: [name], FNP as PCP - General (Internal Medicine) [name], MD as PCP - Cardiology (Cardiology) [name], MD as Consulting Physician (Gastroenterology)  Assessment & Plan Primary peritoneal adenocarcinoma [facility]) She has remote history of Hodgkin lymphoma and breast cancer She was diagnosed with stage III primary peritoneal cancer in 2023 treated with neoadjuvant chemotherapy followed by surgery and completion of chemotherapy by March 2024 Pathology high-grade serous carcinoma, ER positive, p53 mutated, BRCA2 positive CARIS molecular testing, HRD positive, BRCA to positive, PD-L1 CPS score 5%, p53 mutation positive, MSI stable, low tumor mutation burden, BRAF negative, ER 50% positive, folate receptor 1 negative, 2+, 35%, HER2/neu 0 She progressed on olaparib  maintenance treatment  CT imaging from November 2025 in comparison with CT imaging from June which showed positive response to therapy However, I am concerned about her elevated tumor marker recently I plan to repeat imaging study before she returns for her next visit She will continue maintenance bevacizumab   Hypomagnesemia She will continue oral magnesium  supple Anemia due to antineoplastic chemotherapy She has multifactorial anemia, signs of iron deficiency She will continue iron supplement Drug-induced hypotension She continues to have borderline hypotension and tachycardia I will defer to her cardiologist for management  Orders Placed This Encounter  Procedures   CT CHEST ABDOMEN PELVIS W CONTRAST    Standing Status:   Future    Expected Date:   11/09/2024    Expiration Date:   10/26/2025    If indicated for the ordered procedure, I authorize the administration of contrast media per Radiology protocol:   Yes    Does the patient have a contrast media/X-ray dye allergy?:   No    Is patient pregnant?:   No    Preferred imaging  location?:   [facility]    If indicated for the ordered procedure, I authorize the administration of oral contrast media per Radiology protocol:   No    Reason for no oral contrast::   No need oral contrast   Magnesium     Standing Status:   Future    Number of Occurrences:   1    Expiration Date:   10/26/2025     Almarie Bedford, MD  INTERVAL HISTORY: she returns for treatment follow-up Complications related to previous cycle of chemotherapy included anemia,, elevated serum creatinine, and hypotension and tachycardia Overall, she tolerated bevacizumab  well Her blood count has improved She is not symptomatic from her low blood pressure or high heart rate  PHYSICAL EXAMINATION: ECOG PERFORMANCE STATUS: 1 - Symptomatic but completely ambulatory  Lab Results  Component Value Date   CAN125 288.0 (H) 10/26/2024   CAN125 215.0 (H) 10/04/2024   CAN125 33.5 08/23/2024      Latest Ref Rng & Units 10/26/2024   11:25 AM 10/04/2024   10:41 AM 09/13/2024    8:29 AM  CBC  WBC 4.0 - 10.5 K/uL 8.0  7.5  7.7   Hemoglobin 12.0 - 15.0 g/dL 9.6  8.3  8.7   Hematocrit 36.0 - 46.0 % 29.4  26.2  26.5   Platelets 150 - 400 K/uL 438  381  345       Chemistry      Component Value Date/Time   NA 135 10/26/2024 1125   K 4.1 10/26/2024 1125   CL 97 (L) 10/26/2024 1125   CO2 25 10/26/2024 1125   BUN 14 10/26/2024 1125   CREATININE 1.06 (H) 10/26/2024  1125   CREATININE 1.03 (H) 07/26/2024 0930      Component Value Date/Time   CALCIUM  9.3 10/26/2024 1125   ALKPHOS 78 10/26/2024 1125   AST 51 (H) 10/26/2024 1125   AST 18 07/26/2024 0930   ALT 15 10/26/2024 1125   ALT 17 07/26/2024 0930   BILITOT 0.3 10/26/2024 1125   BILITOT 0.2 07/26/2024 0930       Vitals:   10/26/24 1149  BP: (!) 106/59  Pulse: (!) 116  Resp: 18  SpO2: 100%   Filed Weights   10/26/24 1149  Weight: 167 lb 9.6 oz (76 kg)   Other relevant data reviewed during this visit included CBC, CMP, CA125, magnesium 

## 2024-10-27 NOTE — Assessment & Plan Note (Addendum)
 She has remote history of Hodgkin lymphoma and breast cancer She was diagnosed with stage III primary peritoneal cancer in 2023 treated with neoadjuvant chemotherapy followed by surgery and completion of chemotherapy by March 2024 Pathology high-grade serous carcinoma, ER positive, p53 mutated, BRCA2 positive CARIS molecular testing, HRD positive, BRCA to positive, PD-L1 CPS score 5%, p53 mutation positive, MSI stable, low tumor mutation burden, BRAF negative, ER 50% positive, folate receptor 1 negative, 2+, 35%, HER2/neu 0 She progressed on olaparib  maintenance treatment  CT imaging from November 2025 in comparison with CT imaging from June which showed positive response to therapy However, I am concerned about her elevated tumor marker recently I plan to repeat imaging study before she returns for her next visit She will continue maintenance bevacizumab 

## 2024-10-27 NOTE — Assessment & Plan Note (Addendum)
 She continues to have borderline hypotension and tachycardia I will defer to her cardiologist for management

## 2024-10-27 NOTE — Assessment & Plan Note (Addendum)
 She has multifactorial anemia, signs of iron deficiency She will continue iron supplement

## 2024-10-28 ENCOUNTER — Other Ambulatory Visit: Payer: Self-pay

## 2024-11-02 ENCOUNTER — Ambulatory Visit: Admitting: Emergency Medicine

## 2024-11-08 ENCOUNTER — Ambulatory Visit (HOSPITAL_COMMUNITY)
Admission: RE | Admit: 2024-11-08 | Discharge: 2024-11-08 | Disposition: A | Discharge status: Home / Self Care | Source: Ambulatory Visit | Attending: Hematology and Oncology | Admitting: Hematology and Oncology

## 2024-11-08 ENCOUNTER — Telehealth: Payer: Self-pay

## 2024-11-08 DIAGNOSIS — C482 Malignant neoplasm of peritoneum, unspecified: Secondary | ICD-10-CM | POA: Diagnosis present

## 2024-11-08 MED ORDER — HEPARIN SOD (PORK) LOCK FLUSH 100 UNIT/ML IV SOLN: 500.0000 [IU] | Freq: Once | INTRAVENOUS | Status: DC

## 2024-11-08 MED ORDER — IOHEXOL 300 MG/ML  SOLN
100.0000 mL | Freq: Once | INTRAMUSCULAR | Status: AC | PRN
  Administered 2024-11-08: 100 mL via INTRAVENOUS

## 2024-11-08 NOTE — Telephone Encounter (Signed)
 Called and offered appt tomorrow. She has a trip planned and delined. Told her they office will be in contact next week. She verbalized understanding.

## 2024-11-09 ENCOUNTER — Other Ambulatory Visit: Payer: Self-pay | Admitting: Hematology and Oncology

## 2024-11-09 ENCOUNTER — Telehealth: Payer: Self-pay

## 2024-11-09 ENCOUNTER — Other Ambulatory Visit: Payer: Self-pay

## 2024-11-09 NOTE — Telephone Encounter (Addendum)
 Returned call to husband concerned about Lauren Chandler. Appt scheduled on 1/22 at 2 pm with Dr. Lonn to discuss everything. He is aware that all other appts canceled and aware of appt date/time.

## 2024-11-13 ENCOUNTER — Other Ambulatory Visit: Payer: Self-pay | Admitting: Hematology and Oncology

## 2024-11-15 ENCOUNTER — Inpatient Hospital Stay

## 2024-11-15 ENCOUNTER — Ambulatory Visit (HOSPITAL_COMMUNITY)
Admission: RE | Admit: 2024-11-15 | Discharge: 2024-11-15 | Disposition: A | Discharge status: Home / Self Care | Source: Ambulatory Visit | Attending: Hematology and Oncology | Admitting: Hematology and Oncology

## 2024-11-15 ENCOUNTER — Telehealth: Payer: Self-pay

## 2024-11-15 ENCOUNTER — Inpatient Hospital Stay: Admitting: Hematology and Oncology

## 2024-11-15 ENCOUNTER — Other Ambulatory Visit: Payer: Self-pay

## 2024-11-15 VITALS — BP 131/58 | HR 123 | Temp 98.3°F | Resp 18 | Ht 62.0 in | Wt 168.0 lb

## 2024-11-15 DIAGNOSIS — C482 Malignant neoplasm of peritoneum, unspecified: Secondary | ICD-10-CM

## 2024-11-15 DIAGNOSIS — R180 Malignant ascites: Secondary | ICD-10-CM | POA: Insufficient documentation

## 2024-11-15 DIAGNOSIS — D6481 Anemia due to antineoplastic chemotherapy: Secondary | ICD-10-CM | POA: Diagnosis not present

## 2024-11-15 DIAGNOSIS — T451X5A Adverse effect of antineoplastic and immunosuppressive drugs, initial encounter: Secondary | ICD-10-CM | POA: Diagnosis not present

## 2024-11-15 MED ORDER — LIDOCAINE-EPINEPHRINE 1 %-1:100000 IJ SOLN
INTRAMUSCULAR | Status: AC
  Filled 2024-11-15: qty 20

## 2024-11-15 MED ORDER — LIDOCAINE-EPINEPHRINE 1 %-1:100000 IJ SOLN
20.0000 mL | Freq: Once | INTRAMUSCULAR | Status: AC
  Administered 2024-11-15: 12 mL via INTRADERMAL

## 2024-11-15 MED ORDER — OXYCODONE HCL 5 MG PO TABS: 5.0000 mg | ORAL_TABLET | ORAL | 0 refills | Status: AC | PRN

## 2024-11-15 NOTE — Procedures (Signed)
 PROCEDURE SUMMARY:  Successful image-guided paracentesis from the left abdomen.  Yielded 3.4 liters of clear, yellow fluid.  No immediate complications.  EBL: zero Patient tolerated well.   Specimen not sent for labs.  Please see imaging section of Epic for full dictation.  Arlester Keehan NP 11/15/2024 5:19 PM

## 2024-11-15 NOTE — Telephone Encounter (Signed)
 Called and given appt for paracentesis today at [facility] at 2:30, she will check in to [facility] after appt with Dr. Lonn.

## 2024-11-16 ENCOUNTER — Other Ambulatory Visit: Payer: Self-pay

## 2024-11-16 ENCOUNTER — Encounter: Payer: Self-pay | Admitting: Hematology and Oncology

## 2024-11-16 NOTE — Assessment & Plan Note (Addendum)
 She has multifactorial anemia,  We will continue to monitor her blood count carefully and she would likely need transfusion support while on treatment

## 2024-11-16 NOTE — Assessment & Plan Note (Addendum)
 She has recurrent ascites I recommend therapeutic paracentesis

## 2024-11-16 NOTE — Progress Notes (Signed)
 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[facility]) She has remote history of Hodgkin lymphoma and breast cancer She was diagnosed with stage III primary peritoneal cancer in 2023 treated with neoadjuvant chemotherapy followed by surgery and completion of chemotherapy by March 2024 Pathology high-grade serous carcinoma, ER positive, p53 mutated, BRCA2 positive CARIS molecular testing, HRD positive, BRCA to positive, PD-L1 CPS score 5%, p53 mutation positive, MSI stable, low tumor mutation burden, BRAF negative, ER 50% positive, folate receptor 1 negative, 2+, 35%, HER2/neu 0 She progressed on olaparib  maintenance treatment  CT imaging from November 2025 in comparison with CT imaging from June which showed positive response to therapy.  However, her tumor markers are trending up.  CT imaging from January unfortunately show significant cancer recurrence with large volume ascites and peritoneal disease Overall, she has progressed on maintenance bevacizumab   We have extensive discussions about plan of care We discussed other treatment options Ultimately, she is in agreement to try combination of carboplatin  and gemcitabine Given history of severe low blood count, I recommend changing her treatment to every other week and upfront dose reduction of both carboplatin  and gemcitabine I recommend minimum 3 cycles of treatment before repeating imaging study I will continue to trend the tumor marker once a month Anemia due to antineoplastic chemotherapy She has multifactorial anemia,  We will continue to monitor her blood count carefully and she would likely need transfusion support while on treatment Malignant ascites (HCC) She has recurrent ascites I  recommend therapeutic paracentesis  Orders Placed This Encounter  Procedures   CBC with Differential (Cancer Center Only)    Standing Status:   Future    Expected Date:   11/22/2024    Expiration Date:   11/22/2025   CMP (Cancer Center only)    Standing Status:   Future    Expected Date:   11/22/2024    Expiration Date:   11/22/2025   CBC with Differential (Cancer Center Only)    Standing Status:   Future    Expected Date:   12/06/2024    Expiration Date:   12/06/2025   CMP (Cancer Center only)    Standing Status:   Future    Expected Date:   12/06/2024    Expiration Date:   12/06/2025   CBC with Differential (Cancer Center Only)    Standing Status:   Future    Expected Date:   12/20/2024    Expiration Date:   12/20/2025   CMP (Cancer Center only)    Standing Status:   Future    Expected Date:   12/20/2024    Expiration Date:   12/20/2025   CBC with Differential (Cancer Center Only)    Standing Status:   Future    Expected Date:   01/03/2025    Expiration Date:   01/03/2026   CMP (Cancer Center only)    Standing Status:   Future    Expected Date:   01/03/2025    Expiration Date:   01/03/2026   CBC with Differential (Cancer Center Only)    Standing Status:   Future    Expected Date:   01/17/2025    Expiration Date:   01/17/2026   CMP (Cancer Center only)    Standing Status:   Future    Expected  Date:   01/17/2025    Expiration Date:   01/17/2026   CBC with Differential (Cancer Center Only)    Standing Status:   Future    Expected Date:   01/31/2025    Expiration Date:   01/31/2026   CMP (Cancer Center only)    Standing Status:   Future    Expected Date:   01/31/2025    Expiration Date:   01/31/2026     Almarie Bedford, MD  INTERVAL HISTORY: she returns for surveillance follow-up and review of test results Since last time I saw her, she complained of abdominal bloating, distention, intermittent pain, poor oral intake, nausea and vomiting and tiredness I reviewed blood work and imaging study  with the patient and her husband We discussed the role of therapeutic paracentesis We discussed future treatment options  PHYSICAL EXAMINATION: ECOG PERFORMANCE STATUS: 1 - Symptomatic but completely ambulatory  Vitals:   11/15/24 1357  BP: (!) 131/58  Pulse: (!) 123  Resp: 18  Temp: 98.3 F (36.8 C)  SpO2: 100%   Filed Weights   11/15/24 1357  Weight: 168 lb (76.2 kg)    Relevant data reviewed during this visit included CBC, CMP, CT imaging

## 2024-11-16 NOTE — Assessment & Plan Note (Addendum)
 She has remote history of Hodgkin lymphoma and breast cancer She was diagnosed with stage III primary peritoneal cancer in 2023 treated with neoadjuvant chemotherapy followed by surgery and completion of chemotherapy by March 2024 Pathology high-grade serous carcinoma, ER positive, p53 mutated, BRCA2 positive CARIS molecular testing, HRD positive, BRCA to positive, PD-L1 CPS score 5%, p53 mutation positive, MSI stable, low tumor mutation burden, BRAF negative, ER 50% positive, folate receptor 1 negative, 2+, 35%, HER2/neu 0 She progressed on olaparib  maintenance treatment  CT imaging from November 2025 in comparison with CT imaging from June which showed positive response to therapy.  However, her tumor markers are trending up.  CT imaging from January unfortunately show significant cancer recurrence with large volume ascites and peritoneal disease Overall, she has progressed on maintenance bevacizumab   We have extensive discussions about plan of care We discussed other treatment options Ultimately, she is in agreement to try combination of carboplatin  and gemcitabine Given history of severe low blood count, I recommend changing her treatment to every other week and upfront dose reduction of both carboplatin  and gemcitabine I recommend minimum 3 cycles of treatment before repeating imaging study I will continue to trend the tumor marker once a month

## 2024-11-19 ENCOUNTER — Encounter: Payer: Self-pay | Admitting: Hematology and Oncology

## 2024-11-19 ENCOUNTER — Encounter: Payer: Self-pay | Admitting: Gynecologic Oncology

## 2024-11-20 ENCOUNTER — Telehealth: Payer: Self-pay

## 2024-11-20 NOTE — Telephone Encounter (Signed)
 Called per Dr. Lonn regarding mychart message. Told her Dr. Lonn has not room to to see her Thursday and to go to PCP/ urgent care for evaluation of cough. She has the cough because she had mild pleural effusion and mild atelectasis. She verbalized understanding and will call her PCP.

## 2024-11-21 MED FILL — Fosaprepitant Dimeglumine For IV Infusion 150 MG (Base Eq): INTRAVENOUS | Qty: 5 | Status: AC

## 2024-11-21 NOTE — Progress Notes (Signed)
 Pharmacist Chemotherapy Monitoring - Initial Assessment    Anticipated start date: 11/22/24   The following has been reviewed per standard work regarding the patient's treatment regimen: The patient's diagnosis, treatment plan and drug doses, and organ/hematologic function Lab orders and baseline tests specific to treatment regimen  The treatment plan start date, drug sequencing, and pre-medications Prior authorization status  Patient's documented medication list, including drug-drug interaction screen and prescriptions for anti-emetics and supportive care specific to the treatment regimen The drug concentrations, fluid compatibility, administration routes, and timing of the medications to be used The patient's access for treatment and lifetime cumulative dose history, if applicable  The patient's medication allergies and previous infusion related reactions, if applicable   Changes made to treatment plan:  Changed name of Day 8 to Day 15 in the tx plan to align with MD's q2w dosing  Follow up needed:  N/A   Audra Bellard, PharmD, MBA

## 2024-11-22 ENCOUNTER — Inpatient Hospital Stay

## 2024-11-22 VITALS — BP 106/61 | HR 109 | Temp 98.1°F | Resp 20 | Wt 163.0 lb

## 2024-11-22 DIAGNOSIS — C482 Malignant neoplasm of peritoneum, unspecified: Secondary | ICD-10-CM

## 2024-11-22 DIAGNOSIS — T451X5A Adverse effect of antineoplastic and immunosuppressive drugs, initial encounter: Secondary | ICD-10-CM

## 2024-11-22 LAB — CMP (CANCER CENTER ONLY)
ALT: 16 U/L (ref 0–44)
AST: 59 U/L — ABNORMAL HIGH (ref 15–41)
Albumin: 3.5 g/dL (ref 3.5–5.0)
Alkaline Phosphatase: 79 U/L (ref 38–126)
Anion gap: 14 (ref 5–15)
BUN: 17 mg/dL (ref 6–20)
CO2: 24 mmol/L (ref 22–32)
Calcium: 8.7 mg/dL — ABNORMAL LOW (ref 8.9–10.3)
Chloride: 95 mmol/L — ABNORMAL LOW (ref 98–111)
Creatinine: 1.33 mg/dL — ABNORMAL HIGH (ref 0.44–1.00)
GFR, Estimated: 46 mL/min — ABNORMAL LOW
Glucose, Bld: 93 mg/dL (ref 70–99)
Potassium: 4.2 mmol/L (ref 3.5–5.1)
Sodium: 132 mmol/L — ABNORMAL LOW (ref 135–145)
Total Bilirubin: 0.3 mg/dL (ref 0.0–1.2)
Total Protein: 8.3 g/dL — ABNORMAL HIGH (ref 6.5–8.1)

## 2024-11-22 LAB — CBC WITH DIFFERENTIAL (CANCER CENTER ONLY)
Abs Immature Granulocytes: 0.05 10*3/uL (ref 0.00–0.07)
Basophils Absolute: 0.1 10*3/uL (ref 0.0–0.1)
Basophils Relative: 1 %
Eosinophils Absolute: 0.2 10*3/uL (ref 0.0–0.5)
Eosinophils Relative: 3 %
HCT: 27.7 % — ABNORMAL LOW (ref 36.0–46.0)
Hemoglobin: 9 g/dL — ABNORMAL LOW (ref 12.0–15.0)
Immature Granulocytes: 1 %
Lymphocytes Relative: 19 %
Lymphs Abs: 1.7 10*3/uL (ref 0.7–4.0)
MCH: 30.4 pg (ref 26.0–34.0)
MCHC: 32.5 g/dL (ref 30.0–36.0)
MCV: 93.6 fL (ref 80.0–100.0)
Monocytes Absolute: 0.8 10*3/uL (ref 0.1–1.0)
Monocytes Relative: 9 %
Neutro Abs: 6.3 10*3/uL (ref 1.7–7.7)
Neutrophils Relative %: 67 %
Platelet Count: 484 10*3/uL — ABNORMAL HIGH (ref 150–400)
RBC: 2.96 MIL/uL — ABNORMAL LOW (ref 3.87–5.11)
RDW: 18.1 % — ABNORMAL HIGH (ref 11.5–15.5)
WBC Count: 9.1 10*3/uL (ref 4.0–10.5)
nRBC: 0 % (ref 0.0–0.2)

## 2024-11-22 LAB — SAMPLE TO BLOOD BANK

## 2024-11-22 MED ORDER — DEXAMETHASONE SOD PHOSPHATE PF 10 MG/ML IJ SOLN
10.0000 mg | Freq: Once | INTRAMUSCULAR | Status: AC
  Administered 2024-11-22: 10 mg via INTRAVENOUS
  Filled 2024-11-22: qty 1

## 2024-11-22 MED ORDER — SODIUM CHLORIDE 0.9 % IV SOLN: INTRAVENOUS | Status: DC

## 2024-11-22 MED ORDER — SODIUM CHLORIDE 0.9 % IV SOLN
322.0000 mg | Freq: Once | INTRAVENOUS | Status: AC
  Administered 2024-11-22: 320 mg via INTRAVENOUS
  Filled 2024-11-22: qty 32

## 2024-11-22 MED ORDER — PALONOSETRON HCL INJECTION 0.25 MG/5ML
0.2500 mg | Freq: Once | INTRAVENOUS | Status: AC
  Administered 2024-11-22: 0.25 mg via INTRAVENOUS
  Filled 2024-11-22: qty 5

## 2024-11-22 MED ORDER — FAMOTIDINE IN NACL 20-0.9 MG/50ML-% IV SOLN
20.0000 mg | Freq: Once | INTRAVENOUS | Status: AC
  Administered 2024-11-22: 20 mg via INTRAVENOUS
  Filled 2024-11-22: qty 50

## 2024-11-22 MED ORDER — CETIRIZINE HCL 10 MG/ML IV SOLN
10.0000 mg | Freq: Once | INTRAVENOUS | Status: AC
  Administered 2024-11-22: 10 mg via INTRAVENOUS
  Filled 2024-11-22: qty 1

## 2024-11-22 MED ORDER — SODIUM CHLORIDE 0.9 % IV SOLN
800.0000 mg/m2 | Freq: Once | INTRAVENOUS | Status: AC
  Administered 2024-11-22: 1482 mg via INTRAVENOUS
  Filled 2024-11-22: qty 38.98

## 2024-11-22 MED ORDER — SODIUM CHLORIDE 0.9 % IV SOLN
150.0000 mg | Freq: Once | INTRAVENOUS | Status: AC
  Administered 2024-11-22: 150 mg via INTRAVENOUS
  Filled 2024-11-22: qty 5
  Filled 2024-11-22: qty 150

## 2024-11-22 NOTE — Patient Instructions (Addendum)
 [facility] - A DEPT OF [facility]  Discharge Instructions: Thank you for choosing [facility] to provide your oncology and hematology care.   If you have a lab appointment with the Cancer Center, please go directly to the Cancer Center and check in at the registration area.   Wear comfortable clothing and clothing appropriate for easy access to any Portacath or PICC line.   We strive to give you quality time with your provider. You may need to reschedule your appointment if you arrive late (15 or more minutes).  Arriving late affects you and other patients whose appointments are after yours.  Also, if you miss three or more appointments without notifying the office, you may be dismissed from the clinic at the providers discretion.      For prescription refill requests, have your pharmacy contact our office and allow 72 hours for refills to be completed.    Today you received the following chemotherapy and/or immunotherapy agents: Gemzar , Carboplatin       To help prevent nausea and vomiting after your treatment, we encourage you to take your nausea medication as directed.  BELOW ARE SYMPTOMS THAT SHOULD BE REPORTED IMMEDIATELY: *FEVER GREATER THAN 100.4 F (38 C) OR HIGHER *CHILLS OR SWEATING *NAUSEA AND VOMITING THAT IS NOT CONTROLLED WITH YOUR NAUSEA MEDICATION *UNUSUAL SHORTNESS OF BREATH *UNUSUAL BRUISING OR BLEEDING *URINARY PROBLEMS (pain or burning when urinating, or frequent urination) *BOWEL PROBLEMS (unusual diarrhea, constipation, pain near the anus) TENDERNESS IN MOUTH AND THROAT WITH OR WITHOUT PRESENCE OF ULCERS (sore throat, sores in mouth, or a toothache) UNUSUAL RASH, SWELLING OR PAIN  UNUSUAL VAGINAL DISCHARGE OR ITCHING   Items with * indicate a potential emergency and should be followed up as soon as possible or go to the Emergency Department if any problems should occur.  Please show the CHEMOTHERAPY ALERT CARD or  IMMUNOTHERAPY ALERT CARD at check-in to the Emergency Department and triage nurse.  Should you have questions after your visit or need to cancel or reschedule your appointment, please contact [facility] - [facility] DEPT [facility]  Dept: [phone]  and follow the prompts.  Office hours are 8:00 a.m. to 4:30 p.m. Monday - Friday. Please note that voicemails left after 4:00 p.m. may not be returned until the following business day.  We are closed weekends and major holidays. You have access to a nurse at all times for urgent questions. Please call the main number to the clinic Dept: [phone] and follow the prompts.   For any non-urgent questions, you may also contact your provider using MyChart. We now offer e-Visits for anyone 61 and older to request care online for non-urgent symptoms. For details visit [email or web address].   Also download the MyChart app! Go to the app store, search MyChart, open the app, select [place], and log in with your MyChart username and password.  [facility] - A DEPT OF [place]  Discharge Instructions: Thank you for choosing [facility] to provide your oncology and hematology care.   If you have a lab appointment with the Cancer Center, please go directly to the Cancer Center and check in at the registration area.   Wear comfortable clothing and clothing appropriate for easy access to any Portacath or PICC line.   We strive to give you quality time with your provider. You may need  to reschedule your appointment if you arrive late (15 or more minutes).  Arriving late affects you and other patients whose appointments are after yours.  Also, if you miss three or more appointments without notifying the office, you may be dismissed from the clinic at the providers discretion.      For prescription refill requests, have your pharmacy contact our office and allow 72 hours for  refills to be completed.    Today you received the following chemotherapy and/or immunotherapy agents : Gemcitabine  Injection What is this medication? GEMCITABINE  (jem SYE ta been) treats some types of cancer. It works by slowing down the growth of cancer cells. This medicine may be used for other purposes; ask your health care provider or pharmacist if you have questions. COMMON BRAND NAME(S): Gemzar , Infugem  What should I tell my care team before I take this medication? They need to know if you have any of these conditions: Blood disorders Infection Kidney disease Liver disease Lung or breathing disease, such as asthma or COPD Recent or ongoing radiation therapy An unusual or allergic reaction to gemcitabine , other medications, foods, dyes, or preservatives If you or your partner are pregnant or trying to get pregnant Breast-feeding How should I use this medication? This medication is injected into a vein. It is given by your care team in a hospital or clinic setting. Talk to your care team about the use of this medication in children. Special care may be needed. Overdosage: If you think you have taken too much of this medicine contact a poison control center or emergency room at once. NOTE: This medicine is only for you. Do not share this medicine with others. What if I miss a dose? Keep appointments for follow-up doses. It is important not to miss your dose. Call your care team if you are unable to keep an appointment. What may interact with this medication? Interactions have not been studied. This list may not describe all possible interactions. Give your health care provider a list of all the medicines, herbs, non-prescription drugs, or dietary supplements you use. Also tell them if you smoke, drink alcohol , or use illegal drugs. Some items may interact with your medicine. What should I watch for while using this medication? Your condition will be monitored carefully while you are  receiving this medication. This medication may make you feel generally unwell. This is not uncommon, as chemotherapy can affect healthy cells as well as cancer cells. Report any side effects. Continue your course of treatment even though you feel ill unless your care team tells you to stop. In some cases, you may be given additional medications to help with side effects. Follow all directions for their use. This medication may increase your risk of getting an infection. Call your care team for advice if you get a fever, chills, sore throat, or other symptoms of a cold or flu. Do not treat yourself. Try to avoid being around people who are sick. This medication may increase your risk to bruise or bleed. Call your care team if you notice any unusual bleeding. Be careful brushing or flossing your teeth or using a toothpick because you may get an infection or bleed more easily. If you have any dental work done, tell your dentist you are receiving this medication. Avoid taking medications that contain aspirin , acetaminophen , ibuprofen, naproxen, or ketoprofen unless instructed by your care team. These medications may hide a fever. Talk to your care team if you or your partner wish to become pregnant  or think you might be pregnant. This medication can cause serious birth defects if taken during pregnancy and for 6 months after the last dose. A negative pregnancy test is required before starting this medication. A reliable form of contraception is recommended while taking this medication and for 6 months after the last dose. Talk to your care team about effective forms of contraception. Do not father a child while taking this medication and for 3 months after the last dose. Use a condom while having sex during this time period. Do not breastfeed while taking this medication and for at least 1 week after the last dose. This medication may cause infertility. Talk to your care team if you are concerned about your  fertility. What side effects may I notice from receiving this medication? Side effects that you should report to your care team as soon as possible: Allergic reactions--skin rash, itching, hives, swelling of the face, lips, tongue, or throat Capillary leak syndrome--stomach or muscle pain, unusual weakness or fatigue, feeling faint or lightheaded, decrease in the amount of urine, swelling of the ankles, hands, or feet, trouble breathing Infection--fever, chills, cough, sore throat, wounds that don't heal, pain or trouble when passing urine, general feeling of discomfort or being unwell Liver injury--right upper belly pain, loss of appetite, nausea, light-colored stool, dark yellow or brown urine, yellowing skin or eyes, unusual weakness or fatigue Low red blood cell level--unusual weakness or fatigue, dizziness, headache, trouble breathing Lung injury--shortness of breath or trouble breathing, cough, spitting up blood, chest pain, fever Stomach pain, bloody diarrhea, pale skin, unusual weakness or fatigue, decrease in the amount of urine, which may be signs of hemolytic uremic syndrome Sudden and severe headache, confusion, change in vision, seizures, which may be signs of posterior reversible encephalopathy syndrome (PRES) Unusual bruising or bleeding Side effects that usually do not require medical attention (report to your care team if they continue or are bothersome): Diarrhea Drowsiness Hair loss Nausea Pain, redness, or swelling with sores inside the mouth or throat Vomiting This list may not describe all possible side effects. Call your doctor for medical advice about side effects. You may report side effects to FDA at 1-800-FDA-1088. Where should I keep my medication? This medication is given in a hospital or clinic. It will not be stored at home. NOTE: This sheet is a summary. It may not cover all possible information. If you have questions about this medicine, talk to your doctor,  pharmacist, or health care provider.  2024 Elsevier/Gold Standard (2022-02-16 00:00:00)   To help prevent nausea and vomiting after your treatment, we encourage you to take your nausea medication as directed.  BELOW ARE SYMPTOMS THAT SHOULD BE REPORTED IMMEDIATELY: *FEVER GREATER THAN 100.4 F (38 C) OR HIGHER *CHILLS OR SWEATING *NAUSEA AND VOMITING THAT IS NOT CONTROLLED WITH YOUR NAUSEA MEDICATION *UNUSUAL SHORTNESS OF BREATH *UNUSUAL BRUISING OR BLEEDING *URINARY PROBLEMS (pain or burning when urinating, or frequent urination) *BOWEL PROBLEMS (unusual diarrhea, constipation, pain near the anus) TENDERNESS IN MOUTH AND THROAT WITH OR WITHOUT PRESENCE OF ULCERS (sore throat, sores in mouth, or a toothache) UNUSUAL RASH, SWELLING OR PAIN  UNUSUAL VAGINAL DISCHARGE OR ITCHING   Items with * indicate a potential emergency and should be followed up as soon as possible or go to the Emergency Department if any problems should occur.  Please show the CHEMOTHERAPY ALERT CARD or IMMUNOTHERAPY ALERT CARD at check-in to the Emergency Department and triage nurse.  Should you have questions after your visit  or need to cancel or reschedule your appointment, please contact [facility] - [facility]  Dept: [phone]  and follow the prompts.  Office hours are 8:00 a.m. to 4:30 p.m. Monday - Friday. Please note that voicemails left after 4:00 p.m. may not be returned until the following business day.  We are closed weekends and major holidays. You have access to a nurse at all times for urgent questions. Please call the main number to the clinic Dept: [phone] and follow the prompts.   For any non-urgent questions, you may also contact your provider using MyChart. We now offer e-Visits for anyone 15 and older to request care online for non-urgent symptoms. For details visit [email or web address].   Also download the MyChart app! Go to the app store,  search MyChart, open the app, select [place], and log in with your MyChart username and password.

## 2024-11-23 ENCOUNTER — Ambulatory Visit: Payer: Self-pay | Admitting: *Deleted

## 2024-11-27 ENCOUNTER — Ambulatory Visit (HOSPITAL_COMMUNITY)
Admission: RE | Admit: 2024-11-27 | Discharge: 2024-11-27 | Disposition: A | Source: Ambulatory Visit | Attending: Hematology and Oncology

## 2024-11-27 ENCOUNTER — Encounter: Payer: Self-pay | Admitting: Hematology and Oncology

## 2024-11-27 ENCOUNTER — Telehealth: Payer: Self-pay

## 2024-11-27 DIAGNOSIS — C482 Malignant neoplasm of peritoneum, unspecified: Secondary | ICD-10-CM | POA: Insufficient documentation

## 2024-11-27 DIAGNOSIS — R180 Malignant ascites: Secondary | ICD-10-CM | POA: Insufficient documentation

## 2024-11-27 MED ORDER — LIDOCAINE-EPINEPHRINE 1 %-1:100000 IJ SOLN
20.0000 mL | Freq: Once | INTRAMUSCULAR | Status: AC
  Administered 2024-11-27: 10 mL via INTRADERMAL

## 2024-11-27 NOTE — Procedures (Signed)
 PROCEDURE SUMMARY:  Successful US  guided therapeutic paracentesis from LLQ lateral abdomen.  Yielded 3.6 liters of amber colored fluid.  No immediate complications.  Patient tolerated well.  EBL = trace   Leanor JINNY Forget PA-C 11/27/2024 11:30 AM

## 2024-11-27 NOTE — Telephone Encounter (Signed)
 Radiology scheduling called her and scheduled paracentesis for tomorrow at 11 am. She is aware of appts tomorrow.

## 2024-11-28 ENCOUNTER — Other Ambulatory Visit: Payer: Self-pay

## 2024-11-28 ENCOUNTER — Ambulatory Visit (HOSPITAL_COMMUNITY)

## 2024-11-28 ENCOUNTER — Inpatient Hospital Stay: Attending: Gynecologic Oncology

## 2024-11-28 VITALS — BP 120/64 | HR 114 | Temp 98.1°F | Resp 18

## 2024-11-28 DIAGNOSIS — C482 Malignant neoplasm of peritoneum, unspecified: Secondary | ICD-10-CM

## 2024-11-28 DIAGNOSIS — R112 Nausea with vomiting, unspecified: Secondary | ICD-10-CM

## 2024-11-28 MED ORDER — SODIUM CHLORIDE 0.9 % IV SOLN: Freq: Once | INTRAVENOUS | Status: AC

## 2024-11-28 MED ORDER — ONDANSETRON HCL 4 MG/2ML IJ SOLN
8.0000 mg | Freq: Once | INTRAMUSCULAR | Status: AC
  Administered 2024-11-28: 8 mg via INTRAVENOUS
  Filled 2024-11-28: qty 4

## 2024-11-28 MED ORDER — FAMOTIDINE IN NACL 20-0.9 MG/50ML-% IV SOLN
20.0000 mg | Freq: Once | INTRAVENOUS | Status: AC
  Administered 2024-11-28: 20 mg via INTRAVENOUS
  Filled 2024-11-28: qty 50

## 2024-11-28 NOTE — Patient Instructions (Signed)
 Not Enough Water in the Body (Dehydration) in Adults: What to Know Dehydration is a condition in which there is not enough water or other fluids in the body. This happens when a person loses more fluids than they take in. Important organs cannot work right without the right amount of fluids. Any loss of fluids from the body can cause dehydration. Dehydration can be mild, worse, or very bad. It should be treated right away to keep it from getting very bad. What are the causes? Conditions that cause loss of water in the body. They include: Watery poop (diarrhea). Vomiting. Sweating a lot. Fever. Infection. Peeing (urinating) a lot. Not drinking enough fluids. Certain medicines, such as medicines that take extra fluid out of the body (diuretics). Lack of safe drinking water. Not being able to get enough water and food. What increases the risk? Having a long-term (chronic) illness that has not been treated the right way, such as: Diabetes. Heart disease. Kidney disease. Being 17 years of age or older. Having a disability. Living in a place that is high above the ground or sea (high in altitude). The thinner, drier air causes more fluid loss. Doing exercises that put stress on your body for a long time. Being active when in hot places. What are the signs or symptoms? Symptoms of dehydration depend on how bad it is. Mild or worse dehydration Thirst. Dry lips or dry mouth. Feeling dizzy or light-headed. Muscle cramps. Passing little pee or dark pee. Pee may be the color of tea. Headache. Very bad dehydration Changes in skin. Skin may: Be cold to the touch (clammy). Be blotchy or pale. Not go back to normal right after you pinch it and let it go. Little or no tears, pee, or sweat. Fast breathing. Low blood pressure. Weak pulse. Pulse that is more than 100 beats a minute when you are sitting still. Other changes, such as: Feeling very thirsty. Eyes that look hollow  (sunken). Cold hands and feet. Being confused. Being very tired (lethargic) or having trouble waking from sleep. Losing weight. Loss of consciousness. How is this treated? Treatment for this condition depends on how bad your dehydration is. Treatment should start right away. Do not wait until your condition gets very bad. Very bad dehydration is an emergency. You will need to go to a hospital. Mild or worse dehydration can be treated at home. You may be asked to: Drink more fluids. Drink an oral rehydration solution (ORS). This drink gives you the right amount of fluids, salts, and minerals (electrolytes). Very bad dehydration can be treated: With fluids through an IV tube. By correcting low levels of electrolytes in the body. By treating the problem that caused your dehydration. Follow these instructions at home: Oral rehydration solution If told by your doctor, drink an ORS: Make an ORS. Use instructions on the package. Start by drinking small amounts, about  cup (120 mL) every 5-10 minutes. Slowly drink more until you have had the amount that your doctor said to have.  Eating and drinking  Drink enough clear fluid to keep your pee pale yellow. If you were told to drink an ORS, finish the ORS first. Then, start slowly drinking other clear fluids. Drink fluids such as: Water. Do not drink only water. Doing that can make the salt (sodium) level in your body get too low. Water from ice chips you suck on. Fruit juice that you have added water to (diluted). Low-calorie sports drinks. Eat foods that have the right  amounts of salts and minerals, such as bananas, oranges, potatoes, tomatoes, or spinach. Do not drink alcohol . Avoid drinks that have caffeine or sugar. These include:: High-calorie sports drinks. Fruit juice that you did not add water to. Soda. Coffee or energy drinks. Avoid foods that are greasy or have a lot of fat or sugar. General instructions Take over-the-counter  and prescription medicines only as told by your doctor. Do not take sodium tablets. Doing that can make the salt level in your body get too high. Return to your normal activities as told by your doctor. Ask your doctor what activities are safe for you. Keep all follow-up visits. Your doctor may check and change your treatment. Contact a doctor if: You have pain in your belly (abdomen) and the pain: Gets worse. Stays in one place. You have a rash. You have a stiff neck. You get angry or annoyed more easily than normal. You are more tired or have a harder time waking than normal. You feel weak or dizzy. You feel very thirsty. Get help right away if: You have any symptoms of very bad dehydration. You vomit every time you eat or drink. Your vomiting gets worse, does not go away, or you vomit blood or green stuff. You are getting treatment, but symptoms are getting worse. You have a fever. You have a very bad headache. You have: Diarrhea that gets worse or does not go away. Blood in your poop (stool). This may cause poop to look black and tarry. No pee in 6-8 hours. Only a small amount of pee in 6-8 hours, and the pee is very dark. You have trouble breathing. These symptoms may be an emergency. Get help right away. Call 911. Do not wait to see if the symptoms will go away. Do not drive yourself to the hospital. This information is not intended to replace advice given to you by your health care provider. Make sure you discuss any questions you have with your health care provider. Document Revised: 08/17/2024 Document Reviewed: 05/10/2022 [employer]vier Patient Education  2025 [employer].

## 2024-11-28 NOTE — Progress Notes (Signed)
 Pt had c/o persistent nausea with vomiting today. Pt informed this RN that she took last dose of Zofran  at 03:00 and then vomiting twice at 05:00. This RN made Dr. Lanny aware. This RN received v/o from Dr. Lanny for Zofran  8 mg IV to be given in infusion today.   After Pt attempted to eat crackers, Pt had c/o heart burn. Pt informed this RN that she did not take Pantoprazole  at home prior to appt today. This RN made Dr. Lanny aware. This RN received v/o for Pepcid  20 mg IV to be given in infusion today.   At time of d/c Pt reported nausea and heart burn to be improved. Pt stated, I am just tired now.

## 2024-12-05 ENCOUNTER — Ambulatory Visit: Admitting: Emergency Medicine

## 2024-12-06 ENCOUNTER — Inpatient Hospital Stay

## 2024-12-06 ENCOUNTER — Inpatient Hospital Stay: Admitting: Hematology and Oncology
